# Patient Record
Sex: Male | Born: 1940 | Race: White | Hispanic: No | Marital: Single | Smoking: Former smoker
Health system: Southern US, Community
[De-identification: ages and names within clinical notes are randomized; demographics above are authoritative.]

## PROBLEM LIST (undated history)

## (undated) DIAGNOSIS — G40909 Epilepsy, unspecified, not intractable, without status epilepticus: Secondary | ICD-10-CM

## (undated) DIAGNOSIS — I10 Essential (primary) hypertension: Secondary | ICD-10-CM

## (undated) DIAGNOSIS — F329 Major depressive disorder, single episode, unspecified: Secondary | ICD-10-CM

## (undated) DIAGNOSIS — J449 Chronic obstructive pulmonary disease, unspecified: Secondary | ICD-10-CM

## (undated) DIAGNOSIS — I2510 Atherosclerotic heart disease of native coronary artery without angina pectoris: Secondary | ICD-10-CM

## (undated) DIAGNOSIS — F32A Depression, unspecified: Secondary | ICD-10-CM

## (undated) HISTORY — DX: Epilepsy, unspecified, not intractable, without status epilepticus: G40.909

## (undated) HISTORY — DX: Depression, unspecified: F32.A

## (undated) HISTORY — DX: Atherosclerotic heart disease of native coronary artery without angina pectoris: I25.10

## (undated) HISTORY — PX: BELOW KNEE LEG AMPUTATION: SUR23

## (undated) HISTORY — DX: Chronic obstructive pulmonary disease, unspecified: J44.9

## (undated) HISTORY — DX: Essential (primary) hypertension: I10

---
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
## 2019-02-12 ENCOUNTER — Encounter: Payer: Self-pay | Admitting: Adult Health

## 2019-02-12 ENCOUNTER — Non-Acute Institutional Stay (SKILLED_NURSING_FACILITY): Payer: Medicare Other | Admitting: Adult Health

## 2019-02-12 DIAGNOSIS — E876 Hypokalemia: Secondary | ICD-10-CM

## 2019-02-12 DIAGNOSIS — G40909 Epilepsy, unspecified, not intractable, without status epilepticus: Secondary | ICD-10-CM | POA: Insufficient documentation

## 2019-02-12 DIAGNOSIS — M62838 Other muscle spasm: Secondary | ICD-10-CM

## 2019-02-12 DIAGNOSIS — J302 Other seasonal allergic rhinitis: Secondary | ICD-10-CM

## 2019-02-12 DIAGNOSIS — I2510 Atherosclerotic heart disease of native coronary artery without angina pectoris: Secondary | ICD-10-CM

## 2019-02-12 DIAGNOSIS — N390 Urinary tract infection, site not specified: Secondary | ICD-10-CM | POA: Insufficient documentation

## 2019-02-12 DIAGNOSIS — J189 Pneumonia, unspecified organism: Secondary | ICD-10-CM

## 2019-02-12 DIAGNOSIS — I10 Essential (primary) hypertension: Secondary | ICD-10-CM

## 2019-02-12 DIAGNOSIS — E559 Vitamin D deficiency, unspecified: Secondary | ICD-10-CM

## 2019-02-12 DIAGNOSIS — G4700 Insomnia, unspecified: Secondary | ICD-10-CM | POA: Insufficient documentation

## 2019-02-12 DIAGNOSIS — F339 Major depressive disorder, recurrent, unspecified: Secondary | ICD-10-CM

## 2019-02-12 DIAGNOSIS — K5900 Constipation, unspecified: Secondary | ICD-10-CM | POA: Insufficient documentation

## 2019-02-12 DIAGNOSIS — K5901 Slow transit constipation: Secondary | ICD-10-CM

## 2019-02-12 DIAGNOSIS — B9629 Other Escherichia coli [E. coli] as the cause of diseases classified elsewhere: Secondary | ICD-10-CM

## 2019-02-12 DIAGNOSIS — G4709 Other insomnia: Secondary | ICD-10-CM

## 2019-02-12 DIAGNOSIS — J449 Chronic obstructive pulmonary disease, unspecified: Secondary | ICD-10-CM

## 2019-02-12 DIAGNOSIS — Z1612 Extended spectrum beta lactamase (ESBL) resistance: Secondary | ICD-10-CM

## 2019-02-12 DIAGNOSIS — J441 Chronic obstructive pulmonary disease with (acute) exacerbation: Secondary | ICD-10-CM | POA: Insufficient documentation

## 2019-02-12 MED ORDER — BACLOFEN 10 MG PO TABS: 10.0000 mg | ORAL_TABLET | Freq: Two times a day (BID) | ORAL | 0 refills | Status: DC

## 2019-02-12 MED ORDER — BUMETANIDE 1 MG PO TABS: 1.0000 mg | ORAL_TABLET | Freq: Every day | ORAL | 0 refills | Status: DC

## 2019-02-12 MED ORDER — POLYETHYLENE GLYCOL 3350 17 GM/SCOOP PO POWD: 17.0000 g | Freq: Every day | ORAL | 0 refills | Status: DC

## 2019-02-12 MED ORDER — VITAMIN C 500 MG PO TABS: 500.0000 mg | ORAL_TABLET | Freq: Every day | ORAL | 0 refills | Status: DC

## 2019-02-12 MED ORDER — CLOPIDOGREL BISULFATE 75 MG PO TABS: 75.0000 mg | ORAL_TABLET | Freq: Every day | ORAL | 0 refills | Status: DC

## 2019-02-12 MED ORDER — ZOLPIDEM TARTRATE 5 MG PO TABS: 5.0000 mg | ORAL_TABLET | Freq: Every evening | ORAL | 0 refills | Status: DC | PRN

## 2019-02-12 MED ORDER — POTASSIUM CHLORIDE CRYS ER 20 MEQ PO TBCR: 20.0000 meq | EXTENDED_RELEASE_TABLET | Freq: Every day | ORAL | 0 refills | Status: DC

## 2019-02-12 MED ORDER — SPIRIVA HANDIHALER 18 MCG IN CAPS: 18.0000 ug | ORAL_CAPSULE | Freq: Every day | RESPIRATORY_TRACT | 0 refills | Status: DC

## 2019-02-12 MED ORDER — VITAMIN D3 25 MCG (1000 UNIT) PO TABS: 1000.0000 [IU] | ORAL_TABLET | Freq: Every day | ORAL | 0 refills | Status: DC

## 2019-02-12 MED ORDER — DIVALPROEX SODIUM ER 500 MG PO TB24: 500.0000 mg | ORAL_TABLET | Freq: Every day | ORAL | 0 refills | Status: DC

## 2019-02-12 MED ORDER — SERTRALINE HCL 100 MG PO TABS: 100.0000 mg | ORAL_TABLET | Freq: Every day | ORAL | 0 refills | Status: DC

## 2019-02-12 MED ORDER — DOCUSATE SODIUM 100 MG PO CAPS: 100.0000 mg | ORAL_CAPSULE | Freq: Every day | ORAL | 0 refills | Status: DC

## 2019-02-12 MED ORDER — LORATADINE 10 MG PO TABS: 10.0000 mg | ORAL_TABLET | Freq: Every day | ORAL | 0 refills | Status: DC

## 2019-02-12 NOTE — Progress Notes (Signed)
Location:  [facility] Room Number: 306 B Place of Service:  SNF (31) Provider:  Kenard Gower, DNP, FNP-BC  No care team member to display  No emergency contact information on file.  Code Status:  Full Code  Goals of care: Advanced Directive information No flowsheet data found.   Chief Complaint  Patient presents with  . Acute Visit    Follow-hospitalization    HPI:  Pt is a 78 y.o. male who was admitted to [facility] on 02/12/19 post hospitalization 02/04/19 to 02/12/19 from [facility]. He was brought into the hospital with change in mental status, confusion and weakness.  He was noted to have UTI with possible gram-negative rods.  Gentle IV fluids and IV antibiotics given.  Urine culture showed ESBL E. coli.  Antibiotics were changed to IV Invanz 1 g daily and has completed dose.  He was supposed to go home with home health.  However, family requested skilled nursing facility.  He is feeling much better, but extremely weak.  He has PMH of hypertension, seizure disorder, history of left BKA, resting tremor, COPD, senile purpura, hypercholesterolemia and vitamin D deficiency.  Not on File  Outpatient Encounter Medications as of 02/12/2019  Medication Sig  . baclofen (LIORESAL) 10 MG tablet Take 1 tablet (10 mg total) by mouth 2 (two) times daily.  . bumetanide (BUMEX) 1 MG tablet Take 1 tablet (1 mg total) by mouth daily.  . cholecalciferol (VITAMIN D) 25 MCG (1000 UT) tablet Take 1 tablet (1,000 Units total) by mouth daily.  . clopidogrel (PLAVIX) 75 MG tablet Take 1 tablet (75 mg total) by mouth daily.  . divalproex (DEPAKOTE ER) 500 MG 24 hr tablet Take 1 tablet (500 mg total) by mouth at bedtime.  . docusate sodium (COLACE) 100 MG capsule Take 1 capsule (100 mg total) by mouth at bedtime.  Marland Kitchen loratadine (CLARITIN) 10 MG tablet Take 1 tablet (10 mg total) by mouth daily.  . polyethylene glycol powder  (GLYCOLAX/MIRALAX) 17 GM/SCOOP powder Take 17 g by mouth daily.  . potassium chloride SA (KLOR-CON) 20 MEQ tablet Take 1 tablet (20 mEq total) by mouth daily.  . sertraline (ZOLOFT) 100 MG tablet Take 1 tablet (100 mg total) by mouth daily.  Marland Kitchen tiotropium (SPIRIVA HANDIHALER) 18 MCG inhalation capsule Place 1 capsule (18 mcg total) into inhaler and inhale daily.  . vitamin C (ASCORBIC ACID) 500 MG tablet Take 1 tablet (500 mg total) by mouth daily.  Marland Kitchen zolpidem (AMBIEN) 5 MG tablet Take 1 tablet (5 mg total) by mouth at bedtime as needed for sleep.   No facility-administered encounter medications on file as of 02/12/2019.     Review of Systems  GENERAL: No change in appetite, no fatigue, no weight changes, no fever, chills or weakness MOUTH and THROAT: Denies oral discomfort, gingival pain or bleeding, pain from teeth or hoarseness   RESPIRATORY: no cough, SOB, DOE, wheezing, hemoptysis CARDIAC: No chest pain, edema or palpitations GI: No abdominal pain, diarrhea, constipation  NEUROLOGICAL: Denies dizziness, syncope, numbness, or headache PSYCHIATRIC: Denies feelings of depression or anxiety. No report of hallucinations, insomnia, paranoia, or agitation   There is no immunization history on file for this patient. There are no preventive care reminders to display for this patient. No flowsheet data found.   Vitals:   02/12/19 2131  BP: 108/66  Pulse: 84  Resp: 20  Temp: 98.8 F (37.1 C)  Weight: 141 lb (64 kg)  Height: 5\' 11"  (1.803 m)   Body mass index is 19.67 kg/m.  Physical Exam  GENERAL APPEARANCE:  In no acute distress.  SKIN:  Skin is warm and dry.  MOUTH and THROAT: Lips are without lesions. Oral mucosa is moist and without lesions. Tongue is normal in shape, size, and color and without lesions RESPIRATORY: Breathing is even & unlabored, BS CTAB CARDIAC: RRR, no murmur,no extra heart sounds, no edema GI: Abdomen soft, normal BS, no masses, no tenderness  EXTREMITIES:  Left BKA NEUROLOGICAL: + tremor. Speech is clear. Alert to self and place, disoriented to time. PSYCHIATRIC:  Affect and behavior are appropriate  Labs reviewed: Sodium 143, potassium 3.9, BUN 24, creatinine 0.91, AST 59, ALT 17, WBC 15.6, hemoglobin 13.6    Assessment/Plan  1. UTI due to extended-spectrum beta lactamase (ESBL) producing Escherichia coli - completed IV Unasyn treatment  2. Pneumonia of right lung due to infectious organism, unspecified part of lung - completed IV Unasyn treatment  3. Coronary artery disease involving native coronary artery of native heart without angina pectoris - stable - clopidogrel (PLAVIX) 75 MG tablet; Take 1 tablet (75 mg total) by mouth daily.  Dispense: 30 tablet; Refill: 0  4. Essential hypertension - stable - bumetanide (BUMEX) 1 MG tablet; Take 1 tablet (1 mg total) by mouth daily.  Dispense: 30 tablet; Refill: 0  5. Vitamin D deficiency - cholecalciferol (VITAMIN D) 25 MCG (1000 UT) tablet; Take 1 tablet (1,000 Units total) by mouth daily.  Dispense: 30 tablet; Refill: 0  6. Seasonal allergies - loratadine (CLARITIN) 10 MG tablet; Take 1 tablet (10 mg total) by mouth daily.  Dispense: 30 tablet; Refill: 0  7. Muscle spasm/History of left BKA - baclofen (LIORESAL) 10 MG tablet; Take 1 tablet (10 mg total) by mouth 2 (two) times daily.  Dispense: 60 each; Refill: 0  8. Other insomnia - zolpidem (AMBIEN) 5 MG tablet; Take 1 tablet (5 mg total) by mouth at bedtime as needed for sleep.  Dispense: 30 tablet; Refill: 0  9. Chronic obstructive pulmonary disease, unspecified COPD type ([place]) - has O2 @ 2L/min via  - tiotropium (SPIRIVA HANDIHALER) 18 MCG inhalation capsule; Place 1 capsule (18 mcg total) into inhaler and inhale daily.  Dispense: 30 capsule; Refill: 0  10. Depression, recurrent ([place]) - sertraline (ZOLOFT) 100 MG tablet; Take 1 tablet (100 mg total) by mouth daily.  Dispense: 30 tablet; Refill: 0  11.  Hypokalemia - potassium chloride SA (KLOR-CON) 20 MEQ tablet; Take 1 tablet (20 mEq total) by mouth daily.  Dispense: 30 tablet; Refill: 0  12. Slow transit constipation - docusate sodium (COLACE) 100 MG capsule; Take 1 capsule (100 mg total) by mouth at bedtime.  Dispense: 30 capsule; Refill: 0 - polyethylene glycol powder (GLYCOLAX/MIRALAX) 17 GM/SCOOP powder; Take 17 g by mouth daily.  Dispense: 507 g; Refill: 0  13. Seizure disorder (HCC) - divalproex (DEPAKOTE ER) 500 MG 24 hr tablet; Take 1 tablet (500 mg total) by mouth at bedtime.  Dispense: 30 tablet; Refill: 0    Family/ staff Communication: Discussed plan of care with resident.  Labs/tests ordered:   CBC and BMP in 1 week  Goals of care:   Short-term care   Durenda Age, DNP, FNP-BC [facility] and Adult Medicine [phone] (Monday-Friday 8:00 a.m. - 5:00 p.m.) [phone] (after hours)

## 2019-02-13 ENCOUNTER — Encounter: Payer: Self-pay | Admitting: Internal Medicine

## 2019-02-13 ENCOUNTER — Non-Acute Institutional Stay (SKILLED_NURSING_FACILITY): Payer: Medicare Other | Admitting: Internal Medicine

## 2019-02-13 DIAGNOSIS — N390 Urinary tract infection, site not specified: Secondary | ICD-10-CM

## 2019-02-13 DIAGNOSIS — B9629 Other Escherichia coli [E. coli] as the cause of diseases classified elsewhere: Secondary | ICD-10-CM

## 2019-02-13 DIAGNOSIS — R410 Disorientation, unspecified: Secondary | ICD-10-CM | POA: Diagnosis not present

## 2019-02-13 DIAGNOSIS — J189 Pneumonia, unspecified organism: Secondary | ICD-10-CM | POA: Diagnosis not present

## 2019-02-13 DIAGNOSIS — Z1612 Extended spectrum beta lactamase (ESBL) resistance: Secondary | ICD-10-CM

## 2019-02-13 DIAGNOSIS — G40909 Epilepsy, unspecified, not intractable, without status epilepticus: Secondary | ICD-10-CM

## 2019-02-13 DIAGNOSIS — G4709 Other insomnia: Secondary | ICD-10-CM

## 2019-02-13 DIAGNOSIS — R4182 Altered mental status, unspecified: Secondary | ICD-10-CM | POA: Insufficient documentation

## 2019-02-13 DIAGNOSIS — I10 Essential (primary) hypertension: Secondary | ICD-10-CM

## 2019-02-13 NOTE — Patient Instructions (Signed)
See assessment and plan under each diagnosis in the problem list and acutely for this visit 
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
## 2019-02-13 NOTE — Assessment & Plan Note (Signed)
Continue Depakote. 

## 2019-02-13 NOTE — Assessment & Plan Note (Signed)
He has no GU symptoms at this time.

## 2019-02-13 NOTE — Assessment & Plan Note (Signed)
Clinically emphysema suggested with hyperinflated chest and decreased breath sounds.  He denies any active cardiopulmonary symptoms at this time.

## 2019-02-13 NOTE — Assessment & Plan Note (Addendum)
Zolpidem is on the [place]' list.  He denies insomnia. Melatonin will be substituted for this high risk medication

## 2019-02-13 NOTE — Assessment & Plan Note (Addendum)
BP controlled w/o antihypertensive medications  

## 2019-02-14 ENCOUNTER — Encounter: Payer: Self-pay | Admitting: Internal Medicine

## 2019-02-20 LAB — BASIC METABOLIC PANEL
BUN: 23 — AB (ref 4–21)
BUN: 33 — AB (ref 4–21)
CO2: 27 — AB (ref 13–22)
CO2: 27 — AB (ref 13–22)
Chloride: 99 (ref 99–108)
Chloride: 99 (ref 99–108)
Creatinine: 0.7 (ref 0.6–1.3)
Creatinine: 0.7 (ref 0.6–1.3)
Glucose: 103
Glucose: 103
Potassium: 4.9 (ref 3.4–5.3)
Sodium: 139 (ref 137–147)
Sodium: 139 (ref 137–147)

## 2019-02-20 LAB — CBC AND DIFFERENTIAL
HCT: 37 — AB (ref 41–53)
HCT: 37 — AB (ref 41–53)
Hemoglobin: 12.3 — AB (ref 13.5–17.5)
Hemoglobin: 12.3 — AB (ref 13.5–17.5)
Platelets: 560 — AB (ref 150–399)
Platelets: 560 — AB (ref 150–399)
WBC: 9.8
WBC: 9.8

## 2019-02-20 LAB — COMPREHENSIVE METABOLIC PANEL
Calcium: 9.1 (ref 8.7–10.7)
Calcium: 9.1 (ref 8.7–10.7)
GFR calc Af Amer: >90
GFR calc non Af Amer: >90

## 2019-02-20 LAB — CBC
RBC: 3.93 (ref 3.87–5.11)
RBC: 3.93 (ref 3.87–5.11)

## 2019-03-02 ENCOUNTER — Non-Acute Institutional Stay (SKILLED_NURSING_FACILITY): Payer: Medicare Other | Admitting: Adult Health

## 2019-03-02 ENCOUNTER — Encounter: Payer: Self-pay | Admitting: Adult Health

## 2019-03-02 DIAGNOSIS — I10 Essential (primary) hypertension: Secondary | ICD-10-CM | POA: Diagnosis not present

## 2019-03-02 DIAGNOSIS — G40909 Epilepsy, unspecified, not intractable, without status epilepticus: Secondary | ICD-10-CM | POA: Diagnosis not present

## 2019-03-02 DIAGNOSIS — J449 Chronic obstructive pulmonary disease, unspecified: Secondary | ICD-10-CM

## 2019-03-02 DIAGNOSIS — E559 Vitamin D deficiency, unspecified: Secondary | ICD-10-CM | POA: Diagnosis not present

## 2019-03-02 DIAGNOSIS — F339 Major depressive disorder, recurrent, unspecified: Secondary | ICD-10-CM

## 2019-03-02 NOTE — Progress Notes (Signed)
Location:  [facility] Room Number: 125/B Place of Service:  SNF (31) Provider:  Durenda Age, DNP, FNP-BC  Patient Care Team: Hendricks Limes, MD as PCP - General (Internal Medicine)  Extended Emergency Contact Information Primary Emergency Contact: Wilmer, Santillo Mobile Phone: [phone] Relation: Daughter  Code Status:  Full Code  Goals of care: Advanced Directive information Advanced Directives 03/06/2019  Does Patient Have a Medical Advance Directive? Yes  Type of Advance Directive (No Data)  Does patient want to make changes to medical advance directive? No - Patient declined     Chief Complaint  Patient presents with  . Acute Visit    Follow up hospitalization     HPI:  Pt is a 78 y.o. male seen today for follow-up hospitalization.  He has PMH of hypertension, seizure disorder, history of left BKA, resting tremor, COPD, senile purpura, hypercholesterolemia and vitamin D deficiency.  BPs 132/78, 114/64, 130s/77, 170s/73, 126/72. He takes Bumetanide daily. No reported SOB. He has O2 @ 2L/min continuously. He takes Spiriva for COPD.   He has been admitted to [facility] on 02/12/19 post hospitalization 02/04/19 to 02/12/19 from [place] care.  He was brought into the hospital with change in mental status, confusion and weakness.  He was noted to have UTI with possible gram-negative rods.  Gentle IV fluids and IV antibiotics given.  Urine culture showed ESBL E. coli.  Antibiotics were changed to IV Invanz 1 g daily and has completed dose.  He was supposed to go home with home health.  However, family requested skilled nursing facility.  He is currently having a short-term rehabilitation.  Past Medical History:  Diagnosis Date  . CAD (coronary artery disease)   . COPD (chronic obstructive pulmonary disease) ([place])   . Depression   . Essential hypertension   . Seizure disorder [facility])    Past Surgical  History:  Procedure Laterality Date  . BELOW KNEE LEG AMPUTATION Left     No Known Allergies  Outpatient Encounter Medications as of 03/02/2019  Medication Sig  . bisacodyl (DULCOLAX) 10 MG suppository If not relieved by MOM, give 10 mg Bisacodyl suppositiory rectally X 1 dose in 24 hours as needed (Do not use constipation standing orders for residents with renal failure/CFR less than 30. Contact MD for orders) (Physician Order)  . bumetanide (BUMEX) 1 MG tablet Take 1 tablet (1 mg total) by mouth daily.  . cholecalciferol (VITAMIN D) 25 MCG (1000 UT) tablet Take 1 tablet (1,000 Units total) by mouth daily.  . clopidogrel (PLAVIX) 75 MG tablet Take 1 tablet (75 mg total) by mouth daily.  . divalproex (DEPAKOTE ER) 500 MG 24 hr tablet Take 1 tablet (500 mg total) by mouth at bedtime.  . docusate sodium (COLACE) 100 MG capsule Take 1 capsule (100 mg total) by mouth at bedtime.  Marland Kitchen loratadine (CLARITIN) 10 MG tablet Take 1 tablet (10 mg total) by mouth daily.  . magnesium hydroxide (MILK OF MAGNESIA) 400 MG/5ML suspension If no BM in 3 days, give 30 cc Milk of Magnesium p.o. x 1 dose in 24 hours as needed (Do not use standing constipation orders for residents with renal failure CFR less than 30. Contact MD for orders) (Physician Order)  . Melatonin 3 MG TABS Take 3 mg by mouth at bedtime.  . polyethylene glycol powder (GLYCOLAX/MIRALAX) 17 GM/SCOOP powder Take 17 g by mouth daily.  . potassium chloride SA (KLOR-CON) 20 MEQ tablet Take 1 tablet (  20 mEq total) by mouth daily.  . sertraline (ZOLOFT) 100 MG tablet Take 1 tablet (100 mg total) by mouth daily.  . Sodium Phosphates (RA SALINE ENEMA RE) If not relieved by Biscodyl suppository, give disposable Saline Enema rectally X 1 dose/24 hrs as needed (Do not use constipation standing orders for residents with renal failure/CFR less than 30. Contact MD for orders)(Physician Or  . tiotropium (SPIRIVA HANDIHALER) 18 MCG inhalation capsule Place 1  capsule (18 mcg total) into inhaler and inhale daily.  . vitamin C (ASCORBIC ACID) 500 MG tablet Take 1 tablet (500 mg total) by mouth daily.  . [DISCONTINUED] baclofen (LIORESAL) 10 MG tablet Take 1 tablet (10 mg total) by mouth 2 (two) times daily.  . [DISCONTINUED] zolpidem (AMBIEN) 5 MG tablet Take 1 tablet (5 mg total) by mouth at bedtime as needed for sleep.   No facility-administered encounter medications on file as of 03/02/2019.    Review of Systems  GENERAL: No change in appetite, no fatigue, no weight changes, no fever, chills or weakness MOUTH and THROAT: Denies oral discomfort, gingival pain or bleeding RESPIRATORY: no cough, SOB, DOE, wheezing, hemoptysis CARDIAC: No chest pain, edema or palpitations GI: No abdominal pain, diarrhea, constipation, heart burn, nausea or vomiting NEUROLOGICAL: Denies dizziness, syncope, numbness, or headache PSYCHIATRIC: Denies feelings of depression or anxiety. No report of hallucinations, insomnia, paranoia, or agitation   Immunization History  Administered Date(s) Administered  . Influenza-Unspecified 12/14/2018   Pertinent  Health Maintenance Due  Topic Date Due  . PNA vac Low Risk Adult (1 of 2 - PCV13) 03/01/2020 (Originally 12/03/2005)  . INFLUENZA VACCINE  Completed   No flowsheet data found.   Vitals:   03/02/19 1049  BP: 122/78  Pulse: 80  Resp: 18  Temp: 98.1 F (36.7 C)  TempSrc: Oral  SpO2: 97%  Weight: 139 lb 3.2 oz (63.1 kg)  Height: '5\' 11"'  (1.803 m)   Body mass index is 19.41 kg/m.  Physical Exam  GENERAL APPEARANCE:  In no acute distress.  SKIN:  Skin is warm and dry.  MOUTH and THROAT: Lips are without lesions. Oral mucosa is moist and without lesions. Tongue is normal in shape, size, and color and without lesions RESPIRATORY: Breathing is even & unlabored, BS CTAB CARDIAC: RRR, no murmur,no extra heart sounds, no edema GI: Abdomen soft, normal BS, no masses, no tenderness EXTREMITIES: Left  BKA NEUROLOGICAL: There is no tremor. Speech is clear. Alert and oriented X 3. PSYCHIATRIC:  Affect and behavior are appropriate  Labs reviewed:  02/20/19   WBC 9.8 hemoglobin 12.3 hematocrit 36.9 MCV 93.7 platelet 560 glucose 103 calcium 9.1 creatinine 0.69 BUN 32.5 sodium 139  k 4.9 eGFR >90   Assessment/Plan  1. Chronic obstructive pulmonary disease, unspecified COPD type ([place]) - no SOB, nor wheezing, continue Spiriva  2. Seizure disorder ([place]) - no recent seizure, continue Divalproex  3. Vitamin D deficiency - continue Vitamin D supplementation  4. Essential hypertension - well-controlled, no edema, continue Bumetanide  5. Depression, recurrent ([place]) - mood is stable, continue Sertraline    Family/ staff Communication: Discussed plan of care with resident.  Labs/tests ordered: None  Goals of care:   Short-term care   Durenda Age, DNP, FNP-BC [facility] and Adult Medicine [phone] (Monday-Friday 8:00 a.m. - 5:00 p.m.) [phone] (after hours)

## 2019-03-06 ENCOUNTER — Non-Acute Institutional Stay (SKILLED_NURSING_FACILITY): Payer: Medicare Other | Admitting: Adult Health

## 2019-03-06 ENCOUNTER — Encounter: Payer: Self-pay | Admitting: Adult Health

## 2019-03-06 DIAGNOSIS — J449 Chronic obstructive pulmonary disease, unspecified: Secondary | ICD-10-CM | POA: Diagnosis not present

## 2019-03-06 DIAGNOSIS — G40909 Epilepsy, unspecified, not intractable, without status epilepticus: Secondary | ICD-10-CM | POA: Diagnosis not present

## 2019-03-06 DIAGNOSIS — I2510 Atherosclerotic heart disease of native coronary artery without angina pectoris: Secondary | ICD-10-CM

## 2019-03-06 DIAGNOSIS — J302 Other seasonal allergic rhinitis: Secondary | ICD-10-CM

## 2019-03-06 DIAGNOSIS — F339 Major depressive disorder, recurrent, unspecified: Secondary | ICD-10-CM

## 2019-03-06 DIAGNOSIS — G4709 Other insomnia: Secondary | ICD-10-CM

## 2019-03-06 NOTE — Progress Notes (Signed)
Location:  [place] Room Number: 125/B Place of Service:  SNF (31) Provider:  Durenda Age, DNP, FNP-BC  Patient Care Team: Hendricks Limes, MD as PCP - General (Internal Medicine)  Extended Emergency Contact Information Primary Emergency Contact: [place], [place] Mobile Phone: [phone] Relation: Daughter  Code Status:  Full Code  Goals of care: Advanced Directive information Advanced Directives 03/06/2019  Does Patient Have a Medical Advance Directive? Yes  Type of Advance Directive (No Data)  Does patient want to make changes to medical advance directive? No - Patient declined     Chief Complaint  Patient presents with  . Medical Management of Chronic Issues    Routine visit of medical management    HPI:  Pt is a 78 y.o. male seen today for medical management of chronic diseases. He has of hypertension, seizure disorder, history of left BKA, resting tremor, COPD, senile purpura, hypercholesterolemia and vitamin D deficiency. He had weight loss 4.74% in 9 days, 9.18% loss in 16 days. He has meal intake 25-75%. Magic cup was added for supplementation. He denies chest pain. He takes Plavix for CAD.   Past Medical History:  Diagnosis Date  . CAD (coronary artery disease)   . COPD (chronic obstructive pulmonary disease) ([place])   . Depression   . Essential hypertension   . Seizure disorder [facility])    Past Surgical History:  Procedure Laterality Date  . BELOW KNEE LEG AMPUTATION Left     No Known Allergies  Outpatient Encounter Medications as of 03/06/2019  Medication Sig  . bisacodyl (DULCOLAX) 10 MG suppository If not relieved by MOM, give 10 mg Bisacodyl suppositiory rectally X 1 dose in 24 hours as needed (Do not use constipation standing orders for residents with renal failure/CFR less than 30. Contact MD for orders) (Physician Order)  . bumetanide (BUMEX) 1 MG tablet Take 1 tablet (1 mg total) by mouth daily.  . cholecalciferol  (VITAMIN D) 25 MCG (1000 UT) tablet Take 1 tablet (1,000 Units total) by mouth daily.  . clopidogrel (PLAVIX) 75 MG tablet Take 1 tablet (75 mg total) by mouth daily.  . divalproex (DEPAKOTE ER) 500 MG 24 hr tablet Take 1 tablet (500 mg total) by mouth at bedtime.  . docusate sodium (COLACE) 100 MG capsule Take 1 capsule (100 mg total) by mouth at bedtime.  . feeding supplement, ENSURE ENLIVE, (ENSURE ENLIVE) LIQD Take 237 mLs by mouth 2 (two) times daily between meals. To promote weight gain  . loratadine (CLARITIN) 10 MG tablet Take 1 tablet (10 mg total) by mouth daily.  . magnesium hydroxide (MILK OF MAGNESIA) 400 MG/5ML suspension If no BM in 3 days, give 30 cc Milk of Magnesium p.o. x 1 dose in 24 hours as needed (Do not use standing constipation orders for residents with renal failure CFR less than 30. Contact MD for orders) (Physician Order)  . Melatonin 3 MG TABS Take 3 mg by mouth at bedtime.  . NON FORMULARY Give Magic Cup once a day to promote weight gain  . polyethylene glycol powder (GLYCOLAX/MIRALAX) 17 GM/SCOOP powder Take 17 g by mouth daily.  . potassium chloride SA (KLOR-CON) 20 MEQ tablet Take 1 tablet (20 mEq total) by mouth daily.  . sertraline (ZOLOFT) 100 MG tablet Take 1 tablet (100 mg total) by mouth daily.  . Sodium Phosphates (RA SALINE ENEMA RE) If not relieved by Biscodyl suppository, give disposable Saline Enema rectally X 1 dose/24 hrs as needed (Do not use constipation  standing orders for residents with renal failure/CFR less than 30. Contact MD for orders)(Physician Or  . tiotropium (SPIRIVA HANDIHALER) 18 MCG inhalation capsule Place 1 capsule (18 mcg total) into inhaler and inhale daily.  . vitamin C (ASCORBIC ACID) 500 MG tablet Take 1 tablet (500 mg total) by mouth daily.   No facility-administered encounter medications on file as of 03/06/2019.    Review of Systems  GENERAL: No fatigue, no fever, chills or weakness MOUTH and THROAT: Denies oral discomfort,  gingival pain or bleeding RESPIRATORY: no cough, SOB, DOE, wheezing, hemoptysis CARDIAC: No chest pain, edema or palpitations GI: No abdominal pain, diarrhea, constipation, heart burn, nausea or vomiting GU: Denies dysuria, frequency, hematuria, incontinence, or discharge NEUROLOGICAL: Denies dizziness, syncope, numbness, or headache PSYCHIATRIC: Denies feelings of depression or anxiety. No report of hallucinations, insomnia, paranoia, or agitation   Immunization History  Administered Date(s) Administered  . Influenza-Unspecified 12/14/2018   Pertinent  Health Maintenance Due  Topic Date Due  . PNA vac Low Risk Adult (1 of 2 - PCV13) 03/01/2020 (Originally 12/03/2005)  . INFLUENZA VACCINE  Completed   No flowsheet data found.   Vitals:   03/06/19 0957  BP: (!) 98/59  Pulse: 96  Resp: 17  Temp: 98.8 F (37.1 C)  TempSrc: Oral  SpO2: 97%  Weight: 132 lb 9.6 oz (60.1 kg)  Height: '5\' 11"'  (1.803 m)   Body mass index is 18.49 kg/m.  Physical Exam  GENERAL APPEARANCE: In no acute distress.  SKIN:  Skin is warm and dry.  MOUTH and THROAT: Lips are without lesions. Oral mucosa is moist and without lesions. Tongue is normal in shape, size, and color and without lesions RESPIRATORY: Breathing is even & unlabored, BS CTAB CARDIAC: RRR, no murmur,no extra heart sounds, no edema GI: Abdomen soft, normal BS, no masses, no tenderness EXTREMITIES:  Able to move X 4 extremities. Left BKA NEUROLOGICAL:  Speech is clear. Alert and oriented X 3. PSYCHIATRIC:  Affect and behavior are appropriate  Labs reviewed: 02/20/19 WBC 9.8 hemoglobin 12.3 hematocrit 36.9 MCV 93.7 platelet 560 glucose 103 calcium 9.1 creatinine 0.69 BUN 32.5 sodium 139 K 4.9  eGFR>90   Assessment/Plan  1. Coronary artery disease involving native coronary artery of native heart without angina pectoris -Denies chest pain, continue Plavix  2. Chronic obstructive pulmonary disease, unspecified COPD type ([place]) -No  SOB/wheezing, continue oxygen 2 L/minute via [place] continuously, Spiriva Well 3. Seizure disorder ([place]) -No recent seizures, continue divalproex sodium  4. Seasonal allergies -Stable, continue loratadine.  5. Other insomnia -Verbalized sleeping at night, continue melatonin  6. Depression, recurrent ([place]) -Denies depression, continue sertraline   Family/ staff Communication: Discussed plan of care with resident.  Labs/tests ordered: None  Goals of care: Short-term care  Durenda Age, DNP, FNP-BC [facility] [phone] (Monday-Friday 8:00 a.m. - 5:00 p.m.) [phone] (after hours)

## 2019-03-11 ENCOUNTER — Encounter: Payer: Self-pay | Admitting: Adult Health

## 2019-03-19 ENCOUNTER — Encounter: Payer: Self-pay | Admitting: Adult Health

## 2019-03-19 ENCOUNTER — Non-Acute Institutional Stay (SKILLED_NURSING_FACILITY): Payer: Medicare Other | Admitting: Adult Health

## 2019-03-19 DIAGNOSIS — J449 Chronic obstructive pulmonary disease, unspecified: Secondary | ICD-10-CM

## 2019-03-19 DIAGNOSIS — I2510 Atherosclerotic heart disease of native coronary artery without angina pectoris: Secondary | ICD-10-CM

## 2019-03-19 DIAGNOSIS — E876 Hypokalemia: Secondary | ICD-10-CM | POA: Diagnosis not present

## 2019-03-19 DIAGNOSIS — G4709 Other insomnia: Secondary | ICD-10-CM | POA: Diagnosis not present

## 2019-03-19 NOTE — Progress Notes (Signed)
Location:  [place] Room Number: 125/B Place of Service:  SNF (31) Provider:  Durenda Age, DNP, FNP-BC  Patient Care Team: Hendricks Limes, MD as PCP - General (Internal Medicine)  Extended Emergency Contact Information Primary Emergency Contact: K[place]ious, [place] Mobile Phone: [phone] Relation: Daughter  Code Status:  Full Code  Goals of care: Advanced Directive information Advanced Directives 03/06/2019  Does Patient Have a Medical Advance Directive? Yes  Type of Advance Directive (No Data)  Does patient want to make changes to medical advance directive? No - Patient declined     Chief Complaint  Patient presents with  . Medical Management of Chronic Issues    Routine Vist of medical management    HPI:  Pt is a 79 y.o. male seen today for medical management of chronic diseases.  He has PMH of hypertension, seizure disorder, history of left BKA, resting tremor, COPD, senile purpura, hypercholesterolemia and vitamin D deficiency. He continues to have PT/OT for therapeutic strengthening exercise. Goal is for him to return to [facility]/   Past Medical History:  Diagnosis Date  . CAD (coronary artery disease)   . COPD (chronic obstructive pulmonary disease) ([place])   . Depression   . Essential hypertension   . Seizure disorder [facility])    Past Surgical History:  Procedure Laterality Date  . BELOW KNEE LEG AMPUTATION Left     No Known Allergies  Outpatient Encounter Medications as of 03/19/2019  Medication Sig  . bisacodyl (DULCOLAX) 10 MG suppository If not relieved by MOM, give 10 mg Bisacodyl suppositiory rectally X 1 dose in 24 hours as needed (Do not use constipation standing orders for residents with renal failure/CFR less than 30. Contact MD for orders) (Physician Order)  . bumetanide (BUMEX) 1 MG tablet Take 1 tablet (1 mg total) by mouth daily.  . cholecalciferol (VITAMIN D) 25 MCG (1000 UT) tablet Take 1 tablet (1,000 Units  total) by mouth daily.  . clopidogrel (PLAVIX) 75 MG tablet Take 1 tablet (75 mg total) by mouth daily.  . divalproex (DEPAKOTE ER) 500 MG 24 hr tablet Take 1 tablet (500 mg total) by mouth at bedtime.  . docusate sodium (COLACE) 100 MG capsule Take 1 capsule (100 mg total) by mouth at bedtime.  . feeding supplement, ENSURE ENLIVE, (ENSURE ENLIVE) LIQD Take 237 mLs by mouth 2 (two) times daily between meals. To promote weight gain  . loratadine (CLARITIN) 10 MG tablet Take 1 tablet (10 mg total) by mouth daily.  . magnesium hydroxide (MILK OF MAGNESIA) 400 MG/5ML suspension If no BM in 3 days, give 30 cc Milk of Magnesium p.o. x 1 dose in 24 hours as needed (Do not use standing constipation orders for residents with renal failure CFR less than 30. Contact MD for orders) (Physician Order)  . Melatonin 3 MG TABS Take 3 mg by mouth at bedtime.  . NON FORMULARY Give Magic Cup once a day to promote weight gain  . polyethylene glycol powder (GLYCOLAX/MIRALAX) 17 GM/SCOOP powder Take 17 g by mouth daily.  . potassium chloride SA (KLOR-CON) 20 MEQ tablet Take 1 tablet (20 mEq total) by mouth daily.  . sertraline (ZOLOFT) 100 MG tablet Take 1 tablet (100 mg total) by mouth daily.  . Sodium Phosphates (RA SALINE ENEMA RE) If not relieved by Biscodyl suppository, give disposable Saline Enema rectally X 1 dose/24 hrs as needed (Do not use constipation standing orders for residents with renal failure/CFR less than 30. Contact MD for  orders)(Physician Or  . tiotropium (SPIRIVA HANDIHALER) 18 MCG inhalation capsule Place 1 capsule (18 mcg total) into inhaler and inhale daily.  . vitamin C (ASCORBIC ACID) 500 MG tablet Take 1 tablet (500 mg total) by mouth daily.   No facility-administered encounter medications on file as of 03/19/2019.    Review of Systems  GENERAL: No change in appetite, no fatigue, no weight changes, no fever, chills or weakness MOUTH and THROAT: Denies oral discomfort, gingival pain    RESPIRATORY: no cough, SOB, DOE, wheezing, hemoptysis CARDIAC: No chest pain, edema or palpitations GI: No abdominal pain, diarrhea, constipation, heart burn, nausea or vomiting GU: Denies dysuria, frequency, hematuria or discharge NEUROLOGICAL: Denies dizziness, syncope, numbness, or headache PSYCHIATRIC: Denies feelings of depression or anxiety. No report of hallucinations, insomnia, paranoia, or agitation    Immunization History  Administered Date(s) Administered  . Influenza-Unspecified 12/14/2018   Pertinent  Health Maintenance Due  Topic Date Due  . PNA vac Low Risk Adult (1 of 2 - PCV13) 03/01/2020 (Originally 12/03/2005)  . INFLUENZA VACCINE  Completed   No flowsheet data found.   Vitals:   03/19/19 1523  BP: 110/66  Pulse: 92  Resp: 16  Temp: 98.3 F (36.8 C)  TempSrc: Oral  SpO2: 97%  Weight: 136 lb 9.6 oz (62 kg)  Height: '5\' 11"'$  (1.803 m)   Body mass index is 19.05 kg/m.  Physical Exam  GENERAL APPEARANCE: Well nourished. In no acute distress. Normal body habitus SKIN:  Skin is warm and dry.  MOUTH and THROAT: Lips are without lesions. Oral mucosa is moist and without lesions. Tongue is normal in shape, size, and color and without lesions RESPIRATORY: Breathing is even & unlabored, BS CTAB CARDIAC: RRR, no murmur,no extra heart sounds, no edema GI: Abdomen soft, normal BS, no masses, no tenderness EXTREMITIES: Able to move x4 extremities. Left BKA NEUROLOGICAL: + tremors. Speech is clear. Alert to self and time, disoriented to place. PSYCHIATRIC:  Affect and behavior are appropriate  Labs reviewed: 02/20/19   WBC 9.8 hemoglobin 12.3 hematocrit 36.9 MCV 93.7 platelet 560 glucose 103 calcium 9.1 creatinine 0.69 BUN 32.5 sodium 139 K 4.9 eGFR >90   Assessment/Plan  1. Hypokalemia -Continue KCl ER 20 meq daily  2. Chronic obstructive pulmonary disease, unspecified COPD type ([place]) -No wheezing nor SOB, continue Spiriva and O2 at 2 L/minute via [place]  continuously  3. Coronary artery disease involving native coronary artery of native heart without angina pectoris -Denies chest pain, continue Plavix 75 mg 1 tab daily  4. Other insomnia -Sleeps well at night, continue melatonin 3 mg 1 tab at bedtime   Family/ staff Communication: Discussed plan of care with resident.  Labs/tests ordered:  None  Goals of care:   Short-term care   Durenda Age, DNP, FNP-BC [facility] [phone] (Monday-Friday 8:00 a.m. - 5:00 p.m.) [phone] (after hours)

## 2019-03-29 ENCOUNTER — Encounter: Payer: Self-pay | Admitting: Adult Health

## 2019-03-29 DIAGNOSIS — I2510 Atherosclerotic heart disease of native coronary artery without angina pectoris: Secondary | ICD-10-CM

## 2019-03-29 DIAGNOSIS — J449 Chronic obstructive pulmonary disease, unspecified: Secondary | ICD-10-CM

## 2019-03-29 DIAGNOSIS — G40909 Epilepsy, unspecified, not intractable, without status epilepticus: Secondary | ICD-10-CM

## 2019-03-29 DIAGNOSIS — I10 Essential (primary) hypertension: Secondary | ICD-10-CM

## 2019-03-29 DIAGNOSIS — F339 Major depressive disorder, recurrent, unspecified: Secondary | ICD-10-CM

## 2019-03-29 NOTE — Progress Notes (Signed)
Location:  [facility] Room Number: 125/B Place of Service:  SNF (31) Provider:  Kenard Gower, DNP, FNP-BC  Patient Care Team: Pecola Lawless, MD as PCP - General (Internal Medicine)  Extended Emergency Contact Information Primary Emergency Contact: [place], [place] Mobile Phone: [phone] Relation: Daughter  Code Status:  Full Code  Goals of care: Advanced Directive information Advanced Directives 03/29/2019  Does Patient Have a Medical Advance Directive? Yes  Type of Advance Directive (No Data)  Does patient want to make changes to medical advance directive? No - Patient declined     Chief Complaint  Patient presents with  . Discharge Note    Discharge Visit    HPI:  Pt is a 79 y.o. male seen today for medical management of chronic diseases.     Past Medical History:  Diagnosis Date  . CAD (coronary artery disease)   . COPD (chronic obstructive pulmonary disease) ([facility])   . Depression   . Essential hypertension   . Seizure disorder [facility])    Past Surgical History:  Procedure Laterality Date  . BELOW KNEE LEG AMPUTATION Left     No Known Allergies  Outpatient Encounter Medications as of 03/29/2019  Medication Sig  . bisacodyl (DULCOLAX) 10 MG suppository If not relieved by MOM, give 10 mg Bisacodyl suppositiory rectally X 1 dose in 24 hours as needed (Do not use constipation standing orders for residents with renal failure/CFR less than 30. Contact MD for orders) (Physician Order)  . bumetanide (BUMEX) 1 MG tablet Take 1 tablet (1 mg total) by mouth daily.  . cholecalciferol (VITAMIN D) 25 MCG (1000 UT) tablet Take 1 tablet (1,000 Units total) by mouth daily.  . clopidogrel (PLAVIX) 75 MG tablet Take 1 tablet (75 mg total) by mouth daily.  . divalproex (DEPAKOTE ER) 500 MG 24 hr tablet Take 1 tablet (500 mg total) by mouth at bedtime.  . docusate sodium (COLACE) 100 MG capsule Take 1 capsule (100 mg total) by mouth at bedtime.  .  feeding supplement, ENSURE ENLIVE, (ENSURE ENLIVE) LIQD Take 237 mLs by mouth 2 (two) times daily between meals. To promote weight gain  . loratadine (CLARITIN) 10 MG tablet Take 1 tablet (10 mg total) by mouth daily.  . magnesium hydroxide (MILK OF MAGNESIA) 400 MG/5ML suspension If no BM in 3 days, give 30 cc Milk of Magnesium p.o. x 1 dose in 24 hours as needed (Do not use standing constipation orders for residents with renal failure CFR less than 30. Contact MD for orders) (Physician Order)  . Melatonin 3 MG TABS Take 3 mg by mouth at bedtime.  . NON FORMULARY Give Magic Cup once a day to promote weight gain  . polyethylene glycol powder (GLYCOLAX/MIRALAX) 17 GM/SCOOP powder Take 17 g by mouth daily.  . potassium chloride SA (KLOR-CON) 20 MEQ tablet Take 1 tablet (20 mEq total) by mouth daily.  . sertraline (ZOLOFT) 100 MG tablet Take 1 tablet (100 mg total) by mouth daily.  . Sodium Phosphates (RA SALINE ENEMA RE) If not relieved by Biscodyl suppository, give disposable Saline Enema rectally X 1 dose/24 hrs as needed (Do not use constipation standing orders for residents with renal failure/CFR less than 30. Contact MD for orders)(Physician Or  . tiotropium (SPIRIVA HANDIHALER) 18 MCG inhalation capsule Place 1 capsule (18 mcg total) into inhaler and inhale daily.  . vitamin C (ASCORBIC ACID) 500 MG tablet Take 1 tablet (500 mg total) by mouth daily.   No facility-administered  encounter medications on file as of 03/29/2019.    Review of Systems  GENERAL: No change in appetite, no fatigue, no weight changes, no fever, chills or weakness SKIN: Denies rash, itching, wounds, ulcer sores, or nail abnormalities EYES: Denies change in vision, dry eyes, eye pain, itching or discharge EARS: Denies change in hearing, ringing in ears, or earache NOSE: Denies nasal congestion or epistaxis MOUTH and THROAT: Denies oral discomfort, gingival pain or bleeding, pain from teeth or hoarseness   RESPIRATORY:  no cough, SOB, DOE, wheezing, hemoptysis CARDIAC: No chest pain, edema or palpitations GI: No abdominal pain, diarrhea, constipation, heart burn, nausea or vomiting GU: Denies dysuria, frequency, hematuria, incontinence, or discharge MUSCULOSKELETAL: Denies joint pain, muscle pain, back pain, restricted movement, or unusual weakness CIRCULATION: Denies claudication, edema of legs, varicosities, or cold extremities NEUROLOGICAL: Denies dizziness, syncope, numbness, or headache PSYCHIATRIC: Denies feelings of depression or anxiety. No report of hallucinations, insomnia, paranoia, or agitation ENDOCRINE: Denies polyphagia, polyuria, polydipsia, heat or cold intolerance HEME/LYMPH: Denies excessive bruising, petechia, enlarged lymph nodes, or bleeding problems IMMUNOLOGIC: Denies history of frequent infections, AIDS, or use of immunosuppressive agents   Immunization History  Administered Date(s) Administered  . Influenza-Unspecified 12/14/2018   Pertinent  Health Maintenance Due  Topic Date Due  . PNA vac Low Risk Adult (1 of 2 - PCV13) 03/01/2020 (Originally 12/03/2005)  . INFLUENZA VACCINE  Completed   No flowsheet data found.   Vitals:   03/29/19 1106  BP: 113/66  Pulse: (!) 110  Resp: (!) 24  Temp: 97.7 F (36.5 C)  TempSrc: Oral  SpO2: 97%  Weight: 136 lb 9.6 oz (62 kg)  Height: 5\' 11"  (1.803 m)   Body mass index is 19.05 kg/m.  Physical Exam  GENERAL APPEARANCE: Well nourished. In no acute distress. Normal body habitus SKIN:  Skin is warm and dry. There are no suspicious lesions or rash HEAD: Normal in size and contour. No evidence of trauma EYES: Lids open and close normally. No blepharitis, entropion or ectropion. PERRL. Conjunctivae are clear and sclerae are white. Lenses are without opacity EARS: Pinnae are normal. Patient hears normal voice tunes of the examiner MOUTH and THROAT: Lips are without lesions. Oral mucosa is moist and without lesions. Tongue is normal  in shape, size, and color and without lesions NECK: supple, trachea midline, no neck masses, no thyroid tenderness, no thyromegaly LYMPHATICS: No LAN in the neck, no supraclavicular LAN RESPIRATORY: Breathing is even & unlabored, BS CTAB CARDIAC: RRR, no murmur,no extra heart sounds, no edema GI: Abdomen soft, normal BS, no masses, no tenderness, no hepatomegaly, no splenomegaly MUSCULOSKELETAL: No deformities. Movement at each extremity is full and painless. Strength is 5/5 at each extremity. Back is without kyphosis or scoliosis CIRCULATION: Pedal pulses are 2+. There is no edema of the legs, ankles and feet NEUROLOGICAL: There is no tremor. Speech is clear PSYCHIATRIC: Alert and oriented X 3. Affect and behavior are appropriate  Labs reviewed: Recent Labs    02/20/19 0000  NA 139  K 4.9  CL 99  CO2 27*  BUN 23*  CREATININE 0.7  CALCIUM 9.1   No results for input(s): AST, ALT, ALKPHOS, BILITOT, PROT, ALBUMIN in the last 8760 hours. Recent Labs    02/20/19 0000  WBC 9.8  HGB 12.3*  HCT 37*  PLT 560*   No results found for: TSH No results found for: HGBA1C No results found for: CHOL, HDL, LDLCALC, LDLDIRECT, TRIG, CHOLHDL  Significant Diagnostic Results in last  30 days:  No results found.  Assessment/Plan    Family/ staff Communication:   Labs/tests ordered:    Goals of care:      Durenda Age, DNP, FNP-BC [facility] [phone] (Monday-Friday 8:00 a.m. - 5:00 p.m.) [phone] (after hours)  This encounter was created in error - please disregard.

## 2019-04-09 ENCOUNTER — Non-Acute Institutional Stay (SKILLED_NURSING_FACILITY): Payer: Medicare Other | Admitting: Adult Health

## 2019-04-09 ENCOUNTER — Encounter: Payer: Self-pay | Admitting: Adult Health

## 2019-04-09 DIAGNOSIS — I2510 Atherosclerotic heart disease of native coronary artery without angina pectoris: Secondary | ICD-10-CM | POA: Diagnosis not present

## 2019-04-09 DIAGNOSIS — I10 Essential (primary) hypertension: Secondary | ICD-10-CM | POA: Diagnosis not present

## 2019-04-09 DIAGNOSIS — M62838 Other muscle spasm: Secondary | ICD-10-CM | POA: Diagnosis not present

## 2019-04-09 DIAGNOSIS — U071 COVID-19: Secondary | ICD-10-CM

## 2019-04-09 DIAGNOSIS — J449 Chronic obstructive pulmonary disease, unspecified: Secondary | ICD-10-CM

## 2019-04-09 DIAGNOSIS — E876 Hypokalemia: Secondary | ICD-10-CM

## 2019-04-09 NOTE — Progress Notes (Signed)
Location:  [facility] [place] Room Number: 125/B Place of Service:  SNF (31) Provider:  Durenda Age, DNP, FNP-BC  Patient Care Team: Hendricks Limes, MD as PCP - General (Internal Medicine)  Extended Emergency Contact Information Primary Emergency Contact: Brodie, Correll Mobile Phone: [phone] Relation: Daughter  Code Status:  Full Code  Goals of care: Advanced Directive information Advanced Directives 04/09/2019  Does Patient Have a Medical Advance Directive? Yes  Type of Advance Directive (No Data)  Does patient want to make changes to medical advance directive? No - Patient declined     Chief Complaint  Patient presents with  . Medical Management of Chronic Issues    Routine visit of medical management    HPI:  Pt is a 79 y.o. male seen today for medical management of chronic diseases. He has PMH of hypertension, seizure disorder, history of left BKA, resting tremor, COPD, senile purpura, hypercholesterolemia and Vitamin D deficiency. He was seen in his room today. Rapid test for COVID-19 was done on him today. He tested positive. No SOB, cough, fever nor body aches.  He was supposed to transfer to [facility] but he decided to stay at [facility].    Past Medical History:  Diagnosis Date  . CAD (coronary artery disease)   . COPD (chronic obstructive pulmonary disease) ([employer])   . Depression   . Essential hypertension   . Seizure disorder [facility])    Past Surgical History:  Procedure Laterality Date  . BELOW KNEE LEG AMPUTATION Left     No Known Allergies  Outpatient Encounter Medications as of 04/09/2019  Medication Sig  . bisacodyl (DULCOLAX) 10 MG suppository If not relieved by MOM, give 10 mg Bisacodyl suppositiory rectally X 1 dose in 24 hours as needed (Do not use constipation standing orders for residents with renal failure/CFR less than 30. Contact MD for orders) (Physician Order)  . bumetanide (BUMEX) 1 MG tablet Take 1 tablet (1 mg  total) by mouth daily.  . cholecalciferol (VITAMIN D) 25 MCG (1000 UT) tablet Take 1 tablet (1,000 Units total) by mouth daily.  . clopidogrel (PLAVIX) 75 MG tablet Take 1 tablet (75 mg total) by mouth daily.  . divalproex (DEPAKOTE ER) 500 MG 24 hr tablet Take 1 tablet (500 mg total) by mouth at bedtime.  . docusate sodium (COLACE) 100 MG capsule Take 1 capsule (100 mg total) by mouth at bedtime.  . feeding supplement, ENSURE ENLIVE, (ENSURE ENLIVE) LIQD Take 237 mLs by mouth 2 (two) times daily between meals. To promote weight gain  . loratadine (CLARITIN) 10 MG tablet Take 1 tablet (10 mg total) by mouth daily.  . magnesium hydroxide (MILK OF MAGNESIA) 400 MG/5ML suspension If no BM in 3 days, give 30 cc Milk of Magnesium p.o. x 1 dose in 24 hours as needed (Do not use standing constipation orders for residents with renal failure CFR less than 30. Contact MD for orders) (Physician Order)  . Melatonin 3 MG TABS Take 3 mg by mouth at bedtime.  . NON FORMULARY Give Magic Cup once a day to promote weight gain  . OXYGEN Inhale 2 L into the lungs. O2 at 2L/min via [place] continuously (portable & stationary oncentrators)  . polyethylene glycol powder (GLYCOLAX/MIRALAX) 17 GM/SCOOP powder Take 17 g by mouth daily.  . potassium chloride SA (KLOR-CON) 20 MEQ tablet Take 1 tablet (20 mEq total) by mouth daily.  . sertraline (ZOLOFT) 100 MG tablet Take 1 tablet (100 mg total) by mouth daily.  Marland Kitchen  Sodium Phosphates (RA SALINE ENEMA RE) If not relieved by Biscodyl suppository, give disposable Saline Enema rectally X 1 dose/24 hrs as needed (Do not use constipation standing orders for residents with renal failure/CFR less than 30. Contact MD for orders)(Physician Or  . tiotropium (SPIRIVA HANDIHALER) 18 MCG inhalation capsule Place 1 capsule (18 mcg total) into inhaler and inhale daily.  . vitamin C (ASCORBIC ACID) 500 MG tablet Take 1 tablet (500 mg total) by mouth daily.   No facility-administered encounter  medications on file as of 04/09/2019.    Review of Systems  GENERAL: No change in appetite, no fatigue, no weight changes, no fever, chills or weakness MOUTH and THROAT: Denies oral discomfort, gingival pain or bleeding RESPIRATORY: no cough, SOB, DOE, wheezing, hemoptysis CARDIAC: No chest pain, edema or palpitations GI: No abdominal pain, diarrhea, constipation, heart burn, nausea or vomiting NEUROLOGICAL: Denies dizziness, syncope, numbness, or headache PSYCHIATRIC: Denies feelings of depression or anxiety. No report of hallucinations, insomnia, paranoia, or agitation   Immunization History  Administered Date(s) Administered  . Influenza-Unspecified 12/14/2018   Pertinent  Health Maintenance Due  Topic Date Due  . PNA vac Low Risk Adult (1 of 2 - PCV13) 03/01/2020 (Originally 12/03/2005)  . INFLUENZA VACCINE  Completed   No flowsheet data found.   Vitals:   04/09/19 1002  BP: 118/72  Pulse: 70  Resp: 20  Temp: 98.2 F (36.8 C)  TempSrc: Oral  SpO2: 97%  Weight: 136 lb 9.6 oz (62 kg)  Height: 5\' 11"  (1.803 m)   Body mass index is 19.05 kg/m.  Physical Exam  GENERAL APPEARANCE:  In no acute distress.  SKIN:  Skin is warm and dry.  MOUTH and THROAT: Lips are without lesions. Oral mucosa is moist and without lesions. Tongue is normal in shape, size, and color and without lesions RESPIRATORY: Breathing is even & unlabored, BS CTAB CARDIAC: RRR, no murmur,no extra heart sounds, no edema GI: Abdomen soft, normal BS, no masses, no tenderness EXTREMITIES:  Left BKA NEUROLOGICAL: + tremor. Speech is clear. Alert to self and time, disoriented to place. PSYCHIATRIC:  Affect and behavior are appropriate  Labs reviewed: Recent Labs    02/20/19 0000  NA 139  K 4.9  CL 99  CO2 27*  BUN 23*  CREATININE 0.7  CALCIUM 9.1   No results for input(s): AST, ALT, ALKPHOS, BILITOT, PROT, ALBUMIN in the last 8760 hours. Recent Labs    02/20/19 0000  WBC 9.8  HGB 12.3*    HCT 37*  PLT 560*    Assessment/Plan  1. Lab test positive for detection of COVID-19 virus - he was supposed to have Tdap vaccine today but since he has tested positive for COVID-19, will postpone it for now and monitor for signs and symptoms for COVID-19 such as SOB, fever, body aches, etc.  2. Coronary artery disease involving native coronary artery of native heart without angina pectoris -No complaints of chest pains, continue Plavix  3. Essential hypertension -Stable, continue bumetanide  4. Hypokalemia Lab Results  Component Value Date   K 4.9 02/20/2019  -Continue KCl ER supplementation  5. Muscle spasm/History of left BKA -Denies pain, continue supportive care and fall precautions  6. Chronic obstructive pulmonary disease, unspecified COPD type (HCC) - no SOB, stable, continue Spiriva     Family/ staff Communication: Discussed plan of care with resident.  Labs/tests ordered:   None  Goals of care:   Long-term care   14/10/2018, DNP, FNP-BC [facility] [phone] (Monday-Friday 8:00 a.m. - 5:00 p.m.) [phone] (after hours)

## 2019-04-10 ENCOUNTER — Encounter: Payer: Self-pay | Admitting: Internal Medicine

## 2019-04-10 ENCOUNTER — Non-Acute Institutional Stay (SKILLED_NURSING_FACILITY): Payer: Medicare Other | Admitting: Internal Medicine

## 2019-04-10 DIAGNOSIS — J449 Chronic obstructive pulmonary disease, unspecified: Secondary | ICD-10-CM | POA: Diagnosis not present

## 2019-04-10 DIAGNOSIS — U071 COVID-19: Secondary | ICD-10-CM

## 2019-04-10 DIAGNOSIS — I10 Essential (primary) hypertension: Secondary | ICD-10-CM

## 2019-04-10 DIAGNOSIS — G40909 Epilepsy, unspecified, not intractable, without status epilepticus: Secondary | ICD-10-CM | POA: Diagnosis not present

## 2019-04-10 NOTE — Assessment & Plan Note (Signed)
BP controlled; no change in antihypertensive medications  

## 2019-04-10 NOTE — Progress Notes (Signed)
NURSING HOME LOCATION:  [facility] ROOM NUMBER: [id number]  CODE STATUS: Full code  PCP: No PCP  This is a nursing facility follow up for specific acute issue of positive screening for Covid.  Interim medical record and care since last [place] visit was updated with review of diagnostic studies and change in clinical status since last visit were documented.  HPI: He was admitted to the SNF on 11/30 after being hospitalized for approximately 1 week with acute mental status changes with encephalopathy and generalized weakness.  This was in the context of UTI with ESBL E. coli and possible right pneumonia in the context of emphysema.  Other medical diagnoses include essential hypertension seizure disorder, depression, and CAD.  Surgeries include left BKA.  Review of systems: He gave the date as March 30, 2019.  He was very terse with his review of systems responses.  He does validate poor appetite indicating "sometimes I eat, sometimes I don't".  He describes minimal cough with minimal sputum production.  He describes some muscle discomfort in his legs.  It is to be noted that he has a BKA on the left.  Constitutional: No fever, significant weight change, fatigue  Eyes: No redness, discharge, pain, vision change ENT/mouth: No nasal congestion,  purulent discharge, earache, change in hearing, sore throat  Cardiovascular: No chest pain, palpitations, paroxysmal nocturnal dyspnea, claudication, edema  Respiratory: No hemoptysis,  significant snoring, apnea   Gastrointestinal: No heartburn, dysphagia, abdominal pain, nausea /vomiting, rectal bleeding, melena, change in bowels Genitourinary: No dysuria, hematuria, pyuria, incontinence, nocturia Musculoskeletal: No joint stiffness, joint swelling, weakness, pain Dermatologic: No rash, pruritus, change in appearance of skin Neurologic: No dizziness, headache, syncope, seizures, numbness, tingling Psychiatric: No significant anxiety,  depression, insomnia Endocrine: No change in hair/skin/nails, excessive thirst, excessive hunger, excessive urination  Hematologic/lymphatic: No significant bruising, lymphadenopathy, abnormal bleeding Allergy/immunology: No itchy/watery eyes, significant sneezing, urticaria, angioedema  Physical exam:  Pertinent or positive findings: He is thin and appears suboptimally nourished.  Hair is thin.  The right pupil is larger than the left.  This appears to be postoperative in nature.  Breath sounds are decreased.  Heart sounds are distant.  Pedal pulses are decreased in the right lower extremity.  BKA is present on the left.  He is generally weak to opposition more so on the left than the right. There is very faint facial erythema in the distribution under the oxygen nasal cannula and catheter at his neck.  There is no urticaria.  He exhibits intermittent tremor of the left hand.  General appearance: no acute distress, increased work of breathing is present.   Lymphatic: No lymphadenopathy about the head, neck, axilla. Eyes: No conjunctival inflammation or lid edema is present. There is no scleral icterus. Ears:  External ear exam shows no significant lesions or deformities.   Nose:  External nasal examination shows no deformity or inflammation. Nasal mucosa are pink and moist without lesions, exudates Oral exam:  There is no oropharyngeal erythema or exudate. Neck:  No thyromegaly, masses, tenderness noted.    Heart:  No gallop, murmur, click, rub .  Lungs: without wheezes, rhonchi, rales, rubs. Abdomen: Bowel sounds are normal. Abdomen is soft and nontender with no organomegaly, hernias, masses. GU: Deferred  Extremities:  No cyanosis, clubbing, edema  Neurologic exam : Balance, Rhomberg, finger to nose testing could not be completed due to clinical state Skin: Warm & dry w/o tenting. No significant lesions .  See summary under each active problem  in the Problem List with associated updated  therapeutic plan

## 2019-04-10 NOTE — Assessment & Plan Note (Signed)
Clinically no exacerbation on present regimen.  Adequate oxygen saturations despite positive PCR for Covid.

## 2019-04-10 NOTE — Patient Instructions (Signed)
See assessment and plan under each diagnosis in the problem list and acutely for this visit 

## 2019-04-10 NOTE — Assessment & Plan Note (Signed)
Staff reports no seizure activity

## 2019-04-11 ENCOUNTER — Non-Acute Institutional Stay (SKILLED_NURSING_FACILITY): Payer: Medicare Other | Admitting: Adult Health

## 2019-04-11 ENCOUNTER — Encounter: Payer: Self-pay | Admitting: Adult Health

## 2019-04-11 DIAGNOSIS — J449 Chronic obstructive pulmonary disease, unspecified: Secondary | ICD-10-CM

## 2019-04-11 DIAGNOSIS — Z7189 Other specified counseling: Secondary | ICD-10-CM

## 2019-04-11 DIAGNOSIS — E876 Hypokalemia: Secondary | ICD-10-CM

## 2019-04-11 DIAGNOSIS — I10 Essential (primary) hypertension: Secondary | ICD-10-CM | POA: Diagnosis not present

## 2019-04-11 DIAGNOSIS — I2510 Atherosclerotic heart disease of native coronary artery without angina pectoris: Secondary | ICD-10-CM | POA: Diagnosis not present

## 2019-04-11 DIAGNOSIS — G4709 Other insomnia: Secondary | ICD-10-CM

## 2019-04-11 NOTE — Progress Notes (Signed)
Location:  [place] Room Number: 307/A Place of Service:  SNF (31) Provider:  Durenda Age, DNP, FNP-BC  Patient Care Team: Hendricks Limes, MD as PCP - General (Internal Medicine)  Extended Emergency Contact Information Primary Emergency Contact: Yoav, [place]ane Mobile Phone: [phone] Relation: Daughter  Code Status:  Full Code  Goals of care: Advanced Directive information Advanced Directives 04/11/2019  Does Patient Have a Medical Advance Directive? Yes  Type of Advance Directive (No Data)  Does patient want to make changes to medical advance directive? No - Patient declined     Chief Complaint  Patient presents with  . Advanced Directive    Advance Care Planning    HPI:  Pt is a 79 y.o. male who had advance care planning today attended by NP, MDS coordinator, dietician, social worker and resident's daughter, Baruch Gouty,  who attended via teleconference. Resident was invited to the meeting but declined. Code status remains to be full code. Discussed medications, vital signs and weights. Resident is in isolation due to a postive COVID-19 Ag.He eats 25-50% of his meals. He had 2.6 lbs weight loss in a month. He is now a long-term care resident of [facility]. The meeting lasted for 25 minutes.   Past Medical History:  Diagnosis Date  . CAD (coronary artery disease)   . COPD (chronic obstructive pulmonary disease) ([place])   . Depression   . Essential hypertension   . Seizure disorder [employer])    Past Surgical History:  Procedure Laterality Date  . BELOW KNEE LEG AMPUTATION Left     No Known Allergies  Outpatient Encounter Medications as of 04/11/2019  Medication Sig  . bisacodyl (DULCOLAX) 10 MG suppository If not relieved by MOM, give 10 mg Bisacodyl suppositiory rectally X 1 dose in 24 hours as needed (Do not use constipation standing orders for residents with renal failure/CFR less than 30. Contact MD for  orders) (Physician Order)  . bumetanide (BUMEX) 1 MG tablet Take 1 tablet (1 mg total) by mouth daily.  . cholecalciferol (VITAMIN D) 25 MCG (1000 UT) tablet Take 1 tablet (1,000 Units total) by mouth daily.  . clopidogrel (PLAVIX) 75 MG tablet Take 1 tablet (75 mg total) by mouth daily.  . divalproex (DEPAKOTE ER) 500 MG 24 hr tablet Take 1 tablet (500 mg total) by mouth at bedtime.  . docusate sodium (COLACE) 100 MG capsule Take 1 capsule (100 mg total) by mouth at bedtime.  . feeding supplement, ENSURE ENLIVE, (ENSURE ENLIVE) LIQD Take 237 mLs by mouth 2 (two) times daily between meals. To promote weight gain  . loratadine (CLARITIN) 10 MG tablet Take 1 tablet (10 mg total) by mouth daily.  . magnesium hydroxide (MILK OF MAGNESIA) 400 MG/5ML suspension If no BM in 3 days, give 30 cc Milk of Magnesium p.o. x 1 dose in 24 hours as needed (Do not use standing constipation orders for residents with renal failure CFR less than 30. Contact MD for orders) (Physician Order)  . Melatonin 3 MG TABS Take 3 mg by mouth at bedtime.  . NON FORMULARY Give Magic Cup once a day to promote weight gain  . OXYGEN Inhale 2 L into the lungs. O2 at 2L/min via [place] continuously (portable & stationary oncentrators)  . polyethylene glycol powder (GLYCOLAX/MIRALAX) 17 GM/SCOOP powder Take 17 g by mouth daily.  . potassium chloride SA (KLOR-CON) 20 MEQ tablet Take 1 tablet (20 mEq total) by mouth daily.  . sertraline (ZOLOFT) 100 MG  tablet Take 1 tablet (100 mg total) by mouth daily.  . Sodium Phosphates (RA SALINE ENEMA RE) If not relieved by Biscodyl suppository, give disposable Saline Enema rectally X 1 dose/24 hrs as needed (Do not use constipation standing orders for residents with renal failure/CFR less than 30. Contact MD for orders)(Physician Or  . tiotropium (SPIRIVA HANDIHALER) 18 MCG inhalation capsule Place 1 capsule (18 mcg total) into inhaler and inhale daily.  . vitamin C (ASCORBIC ACID) 500 MG tablet Take 1  tablet (500 mg total) by mouth daily.   No facility-administered encounter medications on file as of 04/11/2019.    Review of Systems  GENERAL: No change in appetite, no fatigue, no weight changes, no fever, chills or weakness MOUTH and THROAT: Denies oral discomfort, gingival pain or bleeding RESPIRATORY: no cough, SOB, DOE, wheezing, hemoptysis CARDIAC: No chest pain, edema or palpitations GI: No abdominal pain, diarrhea, constipation, heart burn, nausea or vomiting GU: Denies dysuria, frequency, hematuria, incontinence, or discharge NEUROLOGICAL: Denies dizziness, syncope, numbness, or headache PSYCHIATRIC: Denies feelings of depression or anxiety. No report of hallucinations, insomnia, paranoia, or agitation   Immunization History  Administered Date(s) Administered  . Influenza-Unspecified 12/14/2018   Pertinent  Health Maintenance Due  Topic Date Due  . PNA vac Low Risk Adult (1 of 2 - PCV13) 03/01/2020 (Originally 12/03/2005)  . INFLUENZA VACCINE  Completed   No flowsheet data found.   Vitals:   04/11/19 1339  BP: 104/65  Pulse: 81  Resp: 17  Temp: 97.9 F (36.6 C)  TempSrc: Oral  SpO2: 97%  Weight: 136 lb 9.6 oz (62 kg)  Height: 5\' 11"  (1.803 m)   Body mass index is 19.05 kg/m.  Physical Exam  GENERAL APPEARANCE:  In no acute distress.  SKIN:  Skin is warm and dry.  MOUTH and THROAT: Lips are without lesions. Oral mucosa is moist and without lesions. Tongue is normal in shape, size, and color and without lesions RESPIRATORY: Breathing is even & unlabored, BS CTAB CARDIAC: RRR, no murmur,no extra heart sounds, no edema GI: Abdomen soft, normal BS, no masses, no tenderness EXTREMITIES:  Left BKA NEUROLOGICAL: There is no tremor. Speech is clear PSYCHIATRIC:  Affect and behavior are appropriate  Labs reviewed: Recent Labs    02/20/19 0000  NA 139  K 4.9  CL 99  CO2 27*  BUN 23*  CREATININE 0.7  CALCIUM 9.1    Recent Labs    02/20/19 0000  WBC  9.8  HGB 12.3*  HCT 37*  PLT 560*    Assessment/Plan  1. Advance care planning - remains to be full code - discussed medications, vital signs and weights  2. Coronary artery disease involving native coronary artery of native heart without angina pectoris - no chest pains, continue Plavix  3. Chronic obstructive pulmonary disease, unspecified COPD type (HCC) - no SOB, continue O2@ 2L/min via [place] and Spiriva  4. Essential hypertension - stable, continue Bumetanide  5. Hypokalemia Lab Results  Component Value Date   K 4.9 02/20/2019  - continue KCL ER  6. Other insomnia - verbalized sleeping adequately at night, continue Melatonin  7. Major depression, recurrent (HCC) - mood is stable, continue 14/10/2018 Communication:  Discussed plan of care with daughter and IDT.  Labs/tests ordered:  None  Goals of care:  Long-term care    Art gallery manager, DNP, FNP-BC [facility] [phone] (Monday-Friday 8:00 a.m. - 5:00 p.m.) [phone] (after hours)

## 2019-04-16 LAB — BASIC METABOLIC PANEL
BUN: 29 — AB (ref 4–21)
CO2: 27 — AB (ref 13–22)
Chloride: 93 — AB (ref 99–108)
Creatinine: 0.7 (ref 0.6–1.3)
Glucose: 144
Potassium: 4.9 (ref 3.4–5.3)
Sodium: 133 — AB (ref 137–147)

## 2019-04-16 LAB — COMPREHENSIVE METABOLIC PANEL
Calcium: 9 (ref 8.7–10.7)
GFR calc Af Amer: >90
GFR calc non Af Amer: 88.62

## 2019-04-16 LAB — CBC AND DIFFERENTIAL
HCT: 37 — AB (ref 41–53)
Hemoglobin: 12.7 — AB (ref 13.5–17.5)
Platelets: 417 — AB (ref 150–399)
WBC: 12.1

## 2019-04-16 LAB — CBC: RBC: 4.17 (ref 3.87–5.11)

## 2019-04-17 ENCOUNTER — Non-Acute Institutional Stay (SKILLED_NURSING_FACILITY): Payer: Medicare Other | Admitting: Adult Health

## 2019-04-17 ENCOUNTER — Encounter: Payer: Self-pay | Admitting: Adult Health

## 2019-04-17 DIAGNOSIS — R638 Other symptoms and signs concerning food and fluid intake: Secondary | ICD-10-CM | POA: Diagnosis not present

## 2019-04-17 DIAGNOSIS — J449 Chronic obstructive pulmonary disease, unspecified: Secondary | ICD-10-CM | POA: Diagnosis not present

## 2019-04-17 DIAGNOSIS — U071 COVID-19: Secondary | ICD-10-CM | POA: Diagnosis not present

## 2019-04-17 DIAGNOSIS — E861 Hypovolemia: Secondary | ICD-10-CM

## 2019-04-17 DIAGNOSIS — I9589 Other hypotension: Secondary | ICD-10-CM

## 2019-04-17 DIAGNOSIS — J9601 Acute respiratory failure with hypoxia: Secondary | ICD-10-CM | POA: Diagnosis not present

## 2019-04-17 NOTE — Progress Notes (Signed)
Location:  [facility] Room Number: 301/A Place of Service:  SNF (31) Provider:  Durenda Age, DNP, FNP-BC  Patient Care Team: Hendricks Limes, MD as PCP - General (Internal Medicine)  Extended Emergency Contact Information Primary Emergency Contact: Cebert, Dettmann Mobile Phone: [phone] Relation: Daughter  Code Status:  Full Code  Goals of care: Advanced Directive information Advanced Directives 04/17/2019  Does Patient Have a Medical Advance Directive? Yes  Type of Advance Directive (No Data)  Does patient want to make changes to medical advance directive? No - Patient declined     Chief Complaint  Patient presents with  . Acute Visit    Not Eating    HPI:  Pt is a 79 y.o. male seen today for poor oral intake reported by staff. BMP done on 04/16/19 showed sodium 133, glucose 144, creatinine 0.73 BUN 28.7 K 4.9 eGFR 88.62. CBC showed wbc 12.1, hgb 12.7 platelet 417 and hct 37.4. He was recently diagnosed with COVID-19 and currently on Prednisone and Azithromycin. He was seen in his room today. He was reported to be having O2 sat of 86% and HR 116 during therapy.  Re-check showed O2 sat 90% with O2 at 2L/min via [place] and HR 106. He denies having SOB.     Past Medical History:  Diagnosis Date  . CAD (coronary artery disease)   . COPD (chronic obstructive pulmonary disease) ([place])   . Depression   . Essential hypertension   . Seizure disorder [facility])    Past Surgical History:  Procedure Laterality Date  . BELOW KNEE LEG AMPUTATION Left     No Known Allergies  Outpatient Encounter Medications as of 04/17/2019  Medication Sig  . aspirin EC 81 MG tablet Take 81 mg by mouth daily.  Marland Kitchen azithromycin (ZITHROMAX) 250 MG tablet Take by mouth daily.  . bisacodyl (DULCOLAX) 10 MG suppository If not relieved by MOM, give 10 mg Bisacodyl suppositiory rectally X 1 dose in 24 hours as needed (Do not use constipation standing orders for residents with renal  failure/CFR less than 30. Contact MD for orders) (Physician Order)  . bumetanide (BUMEX) 1 MG tablet Take 1 tablet (1 mg total) by mouth daily.  . cholecalciferol (VITAMIN D) 25 MCG (1000 UT) tablet Take 1 tablet (1,000 Units total) by mouth daily.  . clopidogrel (PLAVIX) 75 MG tablet Take 1 tablet (75 mg total) by mouth daily.  . divalproex (DEPAKOTE ER) 500 MG 24 hr tablet Take 1 tablet (500 mg total) by mouth at bedtime.  . docusate sodium (COLACE) 100 MG capsule Take 1 capsule (100 mg total) by mouth at bedtime.  . feeding supplement, ENSURE ENLIVE, (ENSURE ENLIVE) LIQD Take 237 mLs by mouth 2 (two) times daily between meals. To promote weight gain  . guaiFENesin (ROBITUSSIN) 100 MG/5ML liquid Give 10 ml by mputh eveyr six hours x 5 days for COVD 19  . loratadine (CLARITIN) 10 MG tablet Take 1 tablet (10 mg total) by mouth daily.  . magnesium hydroxide (MILK OF MAGNESIA) 400 MG/5ML suspension If no BM in 3 days, give 30 cc Milk of Magnesium p.o. x 1 dose in 24 hours as needed (Do not use standing constipation orders for residents with renal failure CFR less than 30. Contact MD for orders) (Physician Order)  . Melatonin 3 MG TABS Take 3 mg by mouth at bedtime.  . NON FORMULARY Give Magic Cup once a day to promote weight gain  . OXYGEN Inhale 2 L into the  lungs. O2 at 2L/min via [place] continuously (portable & stationary oncentrators)  . polyethylene glycol powder (GLYCOLAX/MIRALAX) 17 GM/SCOOP powder Take 17 g by mouth daily.  . potassium chloride SA (KLOR-CON) 20 MEQ tablet Take 1 tablet (20 mEq total) by mouth daily.  . predniSONE (DELTASONE) 10 MG tablet Take 30 mg by mouth 2 (two) times daily with a meal.  . sertraline (ZOLOFT) 100 MG tablet Take 1 tablet (100 mg total) by mouth daily.  . Sodium Phosphates (RA SALINE ENEMA RE) If not relieved by Biscodyl suppository, give disposable Saline Enema rectally X 1 dose/24 hrs as needed (Do not use constipation standing orders for residents with renal  failure/CFR less than 30. Contact MD for orders)(Physician Or  . tiotropium (SPIRIVA HANDIHALER) 18 MCG inhalation capsule Place 1 capsule (18 mcg total) into inhaler and inhale daily.  . vitamin C (ASCORBIC ACID) 500 MG tablet Take 1 tablet (500 mg total) by mouth daily.  Marland Kitchen zinc sulfate 220 (50 Zn) MG capsule Take 220 mg by mouth daily.   No facility-administered encounter medications on file as of 04/17/2019.    Review of Systems  GENERAL: No change in appetite, no fatigue, no fever, chills or weakness MOUTH and THROAT: Denies oral discomfort, gingival pain or bleeding RESPIRATORY: +cough CARDIAC: No chest pain, edema or palpitations GI: No abdominal pain, diarrhea, constipation, heart burn, nausea or vomiting GU: Denies dysuria, frequency, hematuria, incontinence, or discharge NEUROLOGICAL: Denies dizziness, syncope, numbness, or headache PSYCHIATRIC: Denies feelings of depression or anxiety. No report of hallucinations, insomnia, paranoia, or agitation   Immunization History  Administered Date(s) Administered  . Influenza-Unspecified 12/14/2018   Pertinent  Health Maintenance Due  Topic Date Due  . PNA vac Low Risk Adult (1 of 2 - PCV13) 03/01/2020 (Originally 12/03/2005)  . INFLUENZA VACCINE  Completed   No flowsheet data found.   Vitals:   04/17/19 1417  BP: 132/74  Pulse: 68  Resp: 16  Temp: (!) 97.5 F (36.4 C)  TempSrc: Oral  SpO2: 97%  Weight: 136 lb 9.6 oz (62 kg)  Height: '5\' 11"'  (1.803 m)   Body mass index is 19.05 kg/m.  Physical Exam  GENERAL APPEARANCE:  In no acute distress.  SKIN:  Skin is warm and dry.  MOUTH and THROAT: Lips are without lesions. Oral mucosa is moist and without lesions. Tongue is normal in shape, size, and color and without lesions RESPIRATORY: Breathing is even & unlabored, BS CTAB CARDIAC: RRR, no murmur,no extra heart sounds, no edema GI: Abdomen soft, normal BS, no masses, no tenderness EXTREMITIES:  Left BKA NEUROLOGICAL:  There is no tremor. Speech is clear. PSYCHIATRIC:  Affect and behavior are appropriate  Labs reviewed: Recent Labs    02/20/19 0000  NA 139  139  K 4.9  CL 99  99  CO2 27*  27*  BUN 33*  23*  CREATININE 0.7  0.7  CALCIUM 9.1  9.1    Recent Labs    02/20/19 0000  WBC 9.8  9.8  HGB 12.3*  12.3*  HCT 37*  37*  PLT 560*  560*    Assessment/Plan  1. Acute respiratory failure with hypoxia (HCC) - will continue O2 @ 2L/min via [place] continuously, start PRN albuterol inhaler  2. Real time reverse transcriptase PCR positive for COVID-19 virus -Continue azithromycin x5 more days and Florastor twice daily till 03/26/2019, decrease prednisone to 20 mg twice a day x5 days, continue aspirin EC 81 mg x 10 more days, zinc sulfate  x5 more days and guaifenesin x5 more days  3. Poor fluid intake - will start 0.9NA @ 75 ml/hour Via [place] X 1L for hydration  4. Chronic obstructive pulmonary disease, unspecified COPD type (HCC) -Continue Spiriva and PRN albuterol  5. Hypotension due to hypovolemia - will decrease Bumetanide from 1 mg daily to 0.5 mg dail, hold for SBP <105     Family/ staff Communication: Discussed plan of care with resident and charge nurse.  Labs/tests ordered:  BMP and CBC  Goals of care:   Long-term care   Durenda Age, DNP, FNP-BC [facility] and Adult Medicine [phone] (Monday-Friday 8:00 a.m. - 5:00 p.m.) [phone] (after hours)

## 2019-04-19 LAB — COMPREHENSIVE METABOLIC PANEL
Calcium: 8.6 — AB (ref 8.7–10.7)
GFR calc Af Amer: >90
GFR calc non Af Amer: >90

## 2019-04-19 LAB — BASIC METABOLIC PANEL
BUN: 24 — AB (ref 4–21)
CO2: 24 — AB (ref 13–22)
Chloride: 99 (ref 99–108)
Creatinine: 0.6 (ref 0.6–1.3)
Glucose: 130
Potassium: 4.5 (ref 3.4–5.3)
Sodium: 138 (ref 137–147)

## 2019-04-20 LAB — CBC: RBC: 3.89 (ref 3.87–5.11)

## 2019-04-20 LAB — CBC AND DIFFERENTIAL
HCT: 35 — AB (ref 41–53)
Hemoglobin: 11.8 — AB (ref 13.5–17.5)
Platelets: 603 — AB (ref 150–399)
WBC: 15.5

## 2019-04-23 LAB — BASIC METABOLIC PANEL
BUN: 19 (ref 4–21)
CO2: 27 — AB (ref 13–22)
Chloride: 100 (ref 99–108)
Creatinine: 0.6 (ref 0.6–1.3)
Glucose: 110
Potassium: 4.9 (ref 3.4–5.3)
Sodium: 138 (ref 137–147)

## 2019-04-23 LAB — COMPREHENSIVE METABOLIC PANEL
Calcium: 8.7 (ref 8.7–10.7)
GFR calc Af Amer: >90
GFR calc non Af Amer: >90

## 2019-04-24 ENCOUNTER — Telehealth: Payer: Self-pay | Admitting: Family

## 2019-04-24 ENCOUNTER — Non-Acute Institutional Stay (SKILLED_NURSING_FACILITY): Payer: Medicare Other | Admitting: Adult Health

## 2019-04-24 ENCOUNTER — Encounter: Payer: Self-pay | Admitting: Adult Health

## 2019-04-24 DIAGNOSIS — J449 Chronic obstructive pulmonary disease, unspecified: Secondary | ICD-10-CM

## 2019-04-24 DIAGNOSIS — U071 COVID-19: Secondary | ICD-10-CM

## 2019-04-24 DIAGNOSIS — J9601 Acute respiratory failure with hypoxia: Secondary | ICD-10-CM

## 2019-04-24 NOTE — Progress Notes (Signed)
Location:  [facility] Room Number: 128/A Place of Service:  SNF (31) Provider:  Durenda Age, DNP, FNP-BC  Patient Care Team: Hendricks Limes, MD as PCP - General (Internal Medicine)  Extended Emergency Contact Information Primary Emergency Contact: Ahren, Pettinger Mobile Phone: [phone] Relation: Daughter  Code Status:  Full Code  Goals of care: Advanced Directive information Advanced Directives 04/24/2019  Does Patient Have a Medical Advance Directive? Yes  Type of Advance Directive (No Data)  Does patient want to make changes to medical advance directive? No - Patient declined     Chief Complaint  Patient presents with  . Acute Visit    Shortness of Breath    HPI:   Pt is a 79 y.o. male who was reported to have 86% O2 saturation even while on O2 @ 3L/min via [place] last night. On call provider was called and chest x-ray was ordered. Chest x-ray showed bilateral infrahilar airspace opacification. He has completed 10 days of Azithromycin for COVID-19. Prednisone was recently changed to Dexamethasone.  He was seen in his room today. He verbalized feeling better. O2 sat checked while he briefly took O2 off due to changing of clothes, was 89-93%. He was then hooked back to O2 @ 3L/min.  Past Medical History:  Diagnosis Date  . CAD (coronary artery disease)   . COPD (chronic obstructive pulmonary disease) ([place])   . Depression   . Essential hypertension   . Seizure disorder [facility])    Past Surgical History:  Procedure Laterality Date  . BELOW KNEE LEG AMPUTATION Left     No Known Allergies  Outpatient Encounter Medications as of 04/24/2019  Medication Sig  . albuterol (VENTOLIN HFA) 108 (90 Base) MCG/ACT inhaler Inhale 2 puffs into the lungs every 6 (six) hours as needed for shortness of breath (FOR Shortness of Breath  (S.O.B.)).  Marland Kitchen aspirin EC 81 MG tablet Take 81 mg by mouth daily.  . bisacodyl (DULCOLAX) 10 MG suppository If not relieved  by MOM, give 10 mg Bisacodyl suppositiory rectally X 1 dose in 24 hours as needed (Do not use constipation standing orders for residents with renal failure/CFR less than 30. Contact MD for orders) (Physician Order)  . cholecalciferol (VITAMIN D) 25 MCG (1000 UT) tablet Take 1 tablet (1,000 Units total) by mouth daily.  . clopidogrel (PLAVIX) 75 MG tablet Take 1 tablet (75 mg total) by mouth daily.  Marland Kitchen dexamethasone (DECADRON) 2 MG tablet Take 6 mg by mouth daily. (d/c prednisone when Dexamethasone is available)  . divalproex (DEPAKOTE ER) 500 MG 24 hr tablet Take 1 tablet (500 mg total) by mouth at bedtime.  . docusate sodium (COLACE) 100 MG capsule Take 1 capsule (100 mg total) by mouth at bedtime.  . feeding supplement, ENSURE ENLIVE, (ENSURE ENLIVE) LIQD Take 237 mLs by mouth 2 (two) times daily between meals. To promote weight gain  . guaiFENesin (ROBITUSSIN) 100 MG/5ML liquid Take 100 mg by mouth 4 (four) times daily as needed for cough.  . loratadine (CLARITIN) 10 MG tablet Take 1 tablet (10 mg total) by mouth daily.  . magnesium hydroxide (MILK OF MAGNESIA) 400 MG/5ML suspension If no BM in 3 days, give 30 cc Milk of Magnesium p.o. x 1 dose in 24 hours as needed (Do not use standing constipation orders for residents with renal failure CFR less than 30. Contact MD for orders) (Physician Order)  . Melatonin 3 MG TABS Take 3 mg by mouth at bedtime.  Marland Kitchen NON  FORMULARY Give Magic Cup once a day to promote weight gain  . NON FORMULARY Add 120 ML med pass two times a day  . OXYGEN Inhale 2 L into the lungs. O2 at 2L/min via [place] continuously (portable & stationary oncentrators)  . polyethylene glycol powder (GLYCOLAX/MIRALAX) 17 GM/SCOOP powder Take 17 g by mouth daily.  . potassium chloride SA (KLOR-CON) 20 MEQ tablet Take 1 tablet (20 mEq total) by mouth daily.  . Saccharomyces boulardii (PROBIOTIC) 250 MG CAPS Take 250 mg by mouth. TAKE 1 CAPSULE BY MOUTH TWICE A DAY FOR 8 DAYS FOR PROBIOTIC (DO NOT  CRUSH)  . sertraline (ZOLOFT) 100 MG tablet Take 1 tablet (100 mg total) by mouth daily.  . Sodium Phosphates (RA SALINE ENEMA RE) If not relieved by Biscodyl suppository, give disposable Saline Enema rectally X 1 dose/24 hrs as needed (Do not use constipation standing orders for residents with renal failure/CFR less than 30. Contact MD for orders)(Physician Or  . tiotropium (SPIRIVA HANDIHALER) 18 MCG inhalation capsule Place 1 capsule (18 mcg total) into inhaler and inhale daily.  . vitamin C (ASCORBIC ACID) 500 MG tablet Take 1 tablet (500 mg total) by mouth daily.  Marland Kitchen zinc sulfate 220 (50 Zn) MG capsule Take 220 mg by mouth daily. TAKE 1 CAPSULE BY MOUTH ONCE DAILY FOR 5 DAYS FOR COVID-19 (DO NOT CRUSH)  . [DISCONTINUED] bumetanide (BUMEX) 1 MG tablet Take 1 tablet (1 mg total) by mouth daily.   No facility-administered encounter medications on file as of 04/24/2019.    Review of Systems  GENERAL: No  Fever or chills  MOUTH and THROAT: Denies oral discomfort, gingival pain or bleeding RESPIRATORY: +SOB CARDIAC: No chest pain, edema or palpitations GI: No abdominal pain, diarrhea, constipation, heart burn, nausea or vomiting GU: Denies dysuria, frequency, hematuria, incontinence, or discharge NEUROLOGICAL: Denies dizziness, syncope, numbness, or headache PSYCHIATRIC: Denies feelings of depression or anxiety. No report of hallucinations, insomnia, paranoia, or agitation    Immunization History  Administered Date(s) Administered  . Influenza-Unspecified 12/14/2018   Pertinent  Health Maintenance Due  Topic Date Due  . PNA vac Low Risk Adult (1 of 2 - PCV13) 03/01/2020 (Originally 12/03/2005)  . INFLUENZA VACCINE  Completed   No flowsheet data found.   Vitals:   04/24/19 1538  BP: 102/62  Pulse: (!) 106  Resp: (!) 22  Temp: 97.9 F (36.6 C)  TempSrc: Oral  SpO2: 97%  Weight: 136 lb 9.6 oz (62 kg)  Height: 5\' 11"  (1.803 m)   Body mass index is 19.05 kg/m.  Physical  Exam  GENERAL APPEARANCE:  In no acute distress.  SKIN:  Skin is warm and dry.  MOUTH and THROAT: Lips are without lesions. Oral mucosa is moist and without lesions. Tongue is normal in shape, size, and color and without lesions RESPIRATORY: BS with rales, no wheezing CARDIAC: no murmur,no extra heart sounds, no edema GI: Abdomen soft, normal BS, no masses, no tenderness EXTREMITIES:  Left BKA NEUROLOGICAL: Speech is clear. PSYCHIATRIC:  Affect and behavior are appropriate  Labs reviewed: Recent Labs    02/20/19 0000  NA 139  139  K 4.9  CL 99  99  CO2 27*  27*  BUN 33*  23*  CREATININE 0.7  0.7  CALCIUM 9.1  9.1    Recent Labs    02/20/19 0000  WBC 9.8  9.8  HGB 12.3*  12.3*  HCT 37*  37*  PLT 560*  560*    Assessment/Plan  1. Acute respiratory failure with hypoxia (HCC) -has COPD and COVID-19, will continue O2 @ 3L/min via [place], incentive spirometry every hour while awake, Dexamethason and PRN albuterol  2. COVID-19 - will continue Azithromycin 250 mg BID X 5 more days and Florastor 250 mg 1 capsule BID X 8 days - continue ASA EC 81 mg, Zinc Sulfate, Guaiafenesin  3. Chronic obstructive pulmonary disease, unspecified COPD type (HCC) - continue O2, Dexamethasone, PRN albuterol and Spiriva     Family/ staff Communication:  Discussed plan of care with resident and charge nurse.  Labs/tests ordered:  None  Goals of care:   Long-term care   Kenard Gower, DNP, FNP-BC [facility] and Adult Medicine [phone] (Monday-Friday 8:00 a.m. - 5:00 p.m.) [phone] (after hours)

## 2019-04-24 NOTE — Telephone Encounter (Signed)
Received call from facility Nurse states patient's oxygen saturation 86 % on 3 L via [place] despite use of  Inhaler.HR 106 b/min.No shortness of breath reported.patient post COVID-19 infection.Orders given for Chest X-ray 2 views rule out acute abnormalities.Nurse Advised to check vital signs Q 4 Hrs X 24 Hrs then resume Q shift.Nurse to [facility] provider if symptoms worsen.

## 2019-04-26 ENCOUNTER — Encounter: Payer: Self-pay | Admitting: Internal Medicine

## 2019-04-26 ENCOUNTER — Non-Acute Institutional Stay (SKILLED_NURSING_FACILITY): Payer: Medicare Other | Admitting: Internal Medicine

## 2019-04-26 DIAGNOSIS — R000 Tachycardia, unspecified: Secondary | ICD-10-CM

## 2019-04-26 DIAGNOSIS — J449 Chronic obstructive pulmonary disease, unspecified: Secondary | ICD-10-CM

## 2019-04-26 DIAGNOSIS — J189 Pneumonia, unspecified organism: Secondary | ICD-10-CM

## 2019-04-26 DIAGNOSIS — G40909 Epilepsy, unspecified, not intractable, without status epilepticus: Secondary | ICD-10-CM

## 2019-04-26 DIAGNOSIS — F3340 Major depressive disorder, recurrent, in remission, unspecified: Secondary | ICD-10-CM

## 2019-04-26 DIAGNOSIS — I10 Essential (primary) hypertension: Secondary | ICD-10-CM

## 2019-04-26 NOTE — Assessment & Plan Note (Addendum)
Clinical picture suggests possibility of serotonin syndrome with tachycardia, tremor, and clonus.  Certainly sertraline 100 mg daily clinically seems to be excessive dose in view of his age and body habitus. Decrease sertraline to 50 mg daily and reassess.  Possibly wean totally depending on clinical assessment at follow-up

## 2019-04-26 NOTE — Patient Instructions (Signed)
See assessment and plan under each diagnosis in the problem list and acutely for this visit 

## 2019-04-26 NOTE — Assessment & Plan Note (Signed)
No seizure activity reported by staff 

## 2019-04-26 NOTE — Progress Notes (Signed)
   NURSING HOME LOCATION:  [facility] ROOM NUMBER:  128-A  CODE STATUS:  FULL CODE  PCP:  Pecola Lawless, MD  [address] [place] [address]   This is a nursing facility follow up of hypoxia, abnormal chest x-ray, and tachycardia.  Interim medical record and care since last [facility] visit was updated with review of diagnostic studies and change in clinical status since last visit were documented.  HPI: He exhibits profound hypoxia on room air with O2 sats of 77%.  This is easily reversible as O2 sats were 99% on 3 L.  He has had 2 courses of antibiotics and steroids for possible Covid HCAP complications.  Chest x-ray suggested bilateral lower lobe atelectasis vs infiltrate.  Imaging while he was hospitalized 11/22-11/30/2020 revealed a questionable right perihilar infiltrate and possible CAP.  He received a full course of antibiotics at that time. He exhibits a persistent tachycardia with heart rates varying from 95-112.  Vital signs today list his pulse as 68.  Blood pressure is low; he is not on any antihypertensive medications.  There is no TSH listed among his labs.  On 2/5 hemoglobin 11.8/hematocrit 35, levels which are stable compared to hospital records and which would not cause tachycardia.  White count was elevated at 15,500,but he has been on oral steroids as noted.  While hospitalized white count was 15,600 but normalized with a full course of antibiotics.  On 2/8 chemistries and electrolytes were essentially normal.  Creatinine was 0.6; this may reflect his relative cachexia and loss of muscle mass.  Review of systems:  He is on Depakote for seizure disorder.  No seizures have been reported at [facility]. He can provide no meaningful responses.  Verbalization was essentially indiscernible and unfocused.  At one point he pointed tremulously at the table commenting on the "chocolate cake there".  There was no chocolate cake on the bedside table.  His roommate validates  that "he will not talk".  Physical exam:  Pertinent or positive findings: He appears somewhat cachectic and chronically ill.  Hair is disheveled.  He is Transport planner.  Arcus senilis is noted.  He is edentulous.  Coarse rhonchi are noted in all lung fields obscuring the heart sounds.  There appears to be a pulse deficit which may be respiratory related which may explain the reported slower pulse. Left BKA is present.  Pedal pulses on the right are decreased.  The right toe appeared to be upgoing with stimulation of the foot.  Limbs are cachectic.  He is tremulous and exhibits some clonus.   Lymphatic: No lymphadenopathy about the head, neck, axilla. Eyes: No conjunctival inflammation or lid edema is present. There is no scleral icterus. Ears:  External ear exam shows no significant lesions or deformities.   Nose:  External nasal examination shows no deformity or inflammation. Nasal mucosa are pink and moist without lesions, exudates Neck:  No thyromegaly, masses, tenderness noted.    Lungs:  without wheezes,  rales, rubs. Abdomen: Bowel sounds are normal. Abdomen is soft and nontender with no organomegaly, hernias, masses. GU: Deferred  Extremities:  No cyanosis, clubbing, edema  Neurologic exam : Balance, Rhomberg, finger to nose testing could not be completed due to clinical state Skin: Warm & dry . No significant lesions or rash.  See summary under each active problem in the Problem List with associated updated therapeutic plan

## 2019-04-26 NOTE — Assessment & Plan Note (Addendum)
His dramatic hypoxia is easily corrected with supplemental oxygen.  Worrisome are chest x-ray changes suggesting atelectasis or possible infiltrates in the lower lobes.  He has received 2 courses of antibiotics and oral steroids.  Film be will be repeated. Decadron orally and IM Rocephin will be continued pending results of the chest x-ray.

## 2019-04-26 NOTE — Assessment & Plan Note (Addendum)
Hypoxia and coarse rhonchi bilaterally suggests high possibility of COVID-19 related HCAP.  Repeat the chest x-ray to reassess the lower lobe infiltrates previously seen.  Initiate Rocephin pending return of the x-ray.

## 2019-04-26 NOTE — Assessment & Plan Note (Addendum)
Blood pressure is actually low without antihypertensive medications according to current vital signs.

## 2019-04-26 NOTE — Assessment & Plan Note (Addendum)
Labs and med list do not suggest an etiology of the tachycardia.  Pulse was listed as 68 today; but I think this represents a pulse deficit related to respiratory variation.  Thyroid functions will be performed.  Sertraline will be weaned.

## 2019-04-27 LAB — TSH: TSH: 1.6 (ref 0.41–5.90)

## 2019-05-01 ENCOUNTER — Emergency Department (HOSPITAL_COMMUNITY): Payer: Medicare Other

## 2019-05-01 ENCOUNTER — Encounter (HOSPITAL_COMMUNITY): Payer: Self-pay

## 2019-05-01 ENCOUNTER — Other Ambulatory Visit: Payer: Self-pay

## 2019-05-01 ENCOUNTER — Emergency Department (HOSPITAL_COMMUNITY)
Admission: EM | Admit: 2019-05-01 | Discharge: 2019-05-01 | Disposition: A | Discharge status: Skilled Nursing Facility | Payer: Medicare Other | Source: Home / Self Care | Attending: Emergency Medicine | Admitting: Emergency Medicine

## 2019-05-01 ENCOUNTER — Non-Acute Institutional Stay (SKILLED_NURSING_FACILITY): Payer: Medicare Other | Admitting: Adult Health

## 2019-05-01 ENCOUNTER — Encounter: Payer: Self-pay | Admitting: Adult Health

## 2019-05-01 DIAGNOSIS — U071 COVID-19: Secondary | ICD-10-CM

## 2019-05-01 DIAGNOSIS — I2510 Atherosclerotic heart disease of native coronary artery without angina pectoris: Secondary | ICD-10-CM | POA: Insufficient documentation

## 2019-05-01 DIAGNOSIS — J1282 Pneumonia due to coronavirus disease 2019: Secondary | ICD-10-CM | POA: Diagnosis not present

## 2019-05-01 DIAGNOSIS — A419 Sepsis, unspecified organism: Secondary | ICD-10-CM | POA: Diagnosis not present

## 2019-05-01 DIAGNOSIS — J449 Chronic obstructive pulmonary disease, unspecified: Secondary | ICD-10-CM | POA: Insufficient documentation

## 2019-05-01 DIAGNOSIS — R042 Hemoptysis: Secondary | ICD-10-CM

## 2019-05-01 DIAGNOSIS — Z79899 Other long term (current) drug therapy: Secondary | ICD-10-CM | POA: Insufficient documentation

## 2019-05-01 DIAGNOSIS — Z7982 Long term (current) use of aspirin: Secondary | ICD-10-CM | POA: Insufficient documentation

## 2019-05-01 DIAGNOSIS — I10 Essential (primary) hypertension: Secondary | ICD-10-CM | POA: Insufficient documentation

## 2019-05-01 DIAGNOSIS — Z87891 Personal history of nicotine dependence: Secondary | ICD-10-CM | POA: Insufficient documentation

## 2019-05-01 DIAGNOSIS — A4102 Sepsis due to Methicillin resistant Staphylococcus aureus: Secondary | ICD-10-CM | POA: Diagnosis not present

## 2019-05-01 LAB — COMPREHENSIVE METABOLIC PANEL
ALT: 14 U/L (ref 0–44)
AST: 15 U/L (ref 15–41)
Albumin: 2.6 g/dL — ABNORMAL LOW (ref 3.5–5.0)
Alkaline Phosphatase: 56 U/L (ref 38–126)
Anion gap: 9 (ref 5–15)
BUN: 26 mg/dL — ABNORMAL HIGH (ref 8–23)
CO2: 26 mmol/L (ref 22–32)
Calcium: 8.3 — AB (ref 8.7–10.7)
Calcium: 8.8 mg/dL — ABNORMAL LOW (ref 8.9–10.3)
Chloride: 106 mmol/L (ref 98–111)
Creatinine, Ser: 0.55 mg/dL — ABNORMAL LOW (ref 0.61–1.24)
GFR calc Af Amer: 90
GFR calc Af Amer: >60 mL/min (ref >60)
GFR calc non Af Amer: 90
GFR calc non Af Amer: >60 mL/min (ref >60)
Glucose, Bld: 114 mg/dL — ABNORMAL HIGH (ref 70–99)
Potassium: 4.8 mmol/L (ref 3.5–5.1)
Sodium: 141 mmol/L (ref 135–145)
Total Bilirubin: 0.6 mg/dL (ref 0.3–1.2)
Total Protein: 7.4 g/dL (ref 6.5–8.1)

## 2019-05-01 LAB — BASIC METABOLIC PANEL
BUN: 22 — AB (ref 4–21)
CO2: 27 — AB (ref 13–22)
Chloride: 105 (ref 99–108)
Creatinine: 0.5 — AB (ref 0.6–1.3)
Glucose: 111
Potassium: 4.4 (ref 3.4–5.3)
Sodium: 140 (ref 137–147)

## 2019-05-01 LAB — CBC AND DIFFERENTIAL
HCT: 31 — AB (ref 41–53)
Hemoglobin: 10.3 — AB (ref 13.5–17.5)
Neutrophils Absolute: 6
Platelets: 335 (ref 150–399)
WBC: 9.1

## 2019-05-01 LAB — CBC WITH DIFFERENTIAL/PLATELET
Abs Immature Granulocytes: 0.03 10*3/uL (ref 0.00–0.07)
Basophils Absolute: 0 10*3/uL (ref 0.0–0.1)
Basophils Relative: 0 %
Eosinophils Absolute: 0 10*3/uL (ref 0.0–0.5)
Eosinophils Relative: 0 %
HCT: 36.8 % — ABNORMAL LOW (ref 39.0–52.0)
Hemoglobin: 11.5 g/dL — ABNORMAL LOW (ref 13.0–17.0)
Immature Granulocytes: 0 %
Lymphocytes Relative: 11 %
Lymphs Abs: 0.8 10*3/uL (ref 0.7–4.0)
MCH: 29.1 pg (ref 26.0–34.0)
MCHC: 31.3 g/dL (ref 30.0–36.0)
MCV: 93.2 fL (ref 80.0–100.0)
Monocytes Absolute: 0.2 10*3/uL (ref 0.1–1.0)
Monocytes Relative: 2 %
Neutro Abs: 6.4 10*3/uL (ref 1.7–7.7)
Neutrophils Relative %: 87 %
Platelets: 376 10*3/uL (ref 150–400)
RBC: 3.95 MIL/uL — ABNORMAL LOW (ref 4.22–5.81)
RDW: 14.2 % (ref 11.5–15.5)
WBC: 7.4 10*3/uL (ref 4.0–10.5)
nRBC: 0 % (ref 0.0–0.2)

## 2019-05-01 LAB — APTT: aPTT: 30 seconds (ref 24–36)

## 2019-05-01 LAB — CBC: RBC: 3.39 — AB (ref 3.87–5.11)

## 2019-05-01 LAB — PROTIME-INR
INR: 1.2 (ref 0.8–1.2)
Prothrombin Time: 14.9 seconds (ref 11.4–15.2)

## 2019-05-01 MED ORDER — IOHEXOL 350 MG/ML SOLN
100.0000 mL | Freq: Once | INTRAVENOUS | Status: AC | PRN
  Administered 2019-05-01: 19:00:00 100 mL via INTRAVENOUS

## 2019-05-01 MED ORDER — SODIUM CHLORIDE (PF) 0.9 % IJ SOLN
INTRAMUSCULAR | Status: AC
  Filled 2019-05-01: qty 50

## 2019-05-01 NOTE — ED Provider Notes (Addendum)
[facility] Note   CSN: [id number] Arrival date & time: 05/01/19  1539     History Chief Complaint  Patient presents with  . COVID+  . Hematemesis    Paul Suarez is a 79 y.o. male presenting for evaluation of hemoptysis.  Patient states he was positive for Covid earlier this month.  Since then, he has needed more oxygen than his baseline, used to be on 2 L is now on 4.  2 days ago he started to cough up blood.  Patient states he has noticed blood coming from his mouth and his nose.  He denies history of epistaxis in the past.  He states he has been told he has thin blood, but is not sure if he is on any blood thinners.  He denies fevers, chills, chest pain, nausea, vomiting, abdominal pain.   Additional history obtained from chart review.  History of COPD, hypertension, CAD.  Reviewed notes from nursing home, patient tested Covid +2 weeks ago, had increased oxygen.  Treated with azithromycin and Rocephin.  States there was bright red mucus on his linen this morning.  He is on Plavix and aspirin daily.  HPI     Past Medical History:  Diagnosis Date  . CAD (coronary artery disease)   . COPD (chronic obstructive pulmonary disease) (HCC)   . Depression   . Essential hypertension   . Seizure disorder [facility])     Patient Active Problem List   Diagnosis Date Noted  . Tachycardia 04/26/2019  . AMS (altered mental status) 02/13/2019  . UTI due to extended-spectrum beta lactamase (ESBL) producing Escherichia coli 02/12/2019  . Pneumonia 02/12/2019  . CAD (coronary artery disease) 02/12/2019  . Essential hypertension 02/12/2019  . Vitamin D deficiency 02/12/2019  . Seasonal allergies 02/12/2019  . Muscle spasm/History of left BKA 02/12/2019  . Insomnia 02/12/2019  . COPD (chronic obstructive pulmonary disease) (HCC) 02/12/2019  . Major depression, recurrent (HCC) 02/12/2019  . Hypokalemia 02/12/2019  . Constipation 02/12/2019  . Seizure  disorder (HCC) 02/12/2019    Past Surgical History:  Procedure Laterality Date  . BELOW KNEE LEG AMPUTATION Left        History reviewed. No pertinent family history.  Social History   Tobacco Use  . Smoking status: Former Smoker    Years: 20.00  . Smokeless tobacco: Never Used  Substance Use Topics  . Alcohol use: Not Currently  . Drug use: Never    Home Medications Prior to Admission medications   Medication Sig Start Date End Date Taking? Authorizing Provider  albuterol (VENTOLIN HFA) 108 (90 Base) MCG/ACT inhaler Inhale 2 puffs into the lungs every 6 (six) hours as needed for shortness of breath (FOR Shortness of Breath  (S.O.B.)).   Yes [provider]  aspirin EC 81 MG tablet Take 81 mg by mouth daily. 04/24/19 05/03/19 Yes [provider]  cholecalciferol (VITAMIN D) 25 MCG (1000 UT) tablet Take 1 tablet (1,000 Units total) by mouth daily. 02/12/19  Yes Medina-Vargas, Monina C, NP  clopidogrel (PLAVIX) 75 MG tablet Take 1 tablet (75 mg total) by mouth daily. 02/12/19  Yes Medina-Vargas, Monina C, NP  dexamethasone (DECADRON) 2 MG tablet Take 6 mg by mouth daily. (d/c prednisone when Dexamethasone is available)   Yes [provider]  divalproex (DEPAKOTE ER) 500 MG 24 hr tablet Take 1 tablet (500 mg total) by mouth at bedtime. 02/12/19  Yes Medina-Vargas, Monina C, NP  docusate sodium (COLACE) 100 MG capsule  Take 1 capsule (100 mg total) by mouth at bedtime. 02/12/19  Yes Medina-Vargas, Monina C, NP  ipratropium-albuterol (DUONEB) 0.5-2.5 (3) MG/3ML SOLN Take 3 mLs by nebulization 4 (four) times daily. For SOB and Wheezing 04/28/19 05/01/19 Yes [provider]  loratadine (CLARITIN) 10 MG tablet Take 1 tablet (10 mg total) by mouth daily. 02/12/19  Yes Medina-Vargas, Monina C, NP  magnesium hydroxide (MILK OF MAGNESIA) 400 MG/5ML suspension If no BM in 3 days, give 30 cc Milk of Magnesium p.o. x 1 dose in 24 hours as needed (Do not use standing  constipation orders for residents with renal failure CFR less than 30. Contact MD for orders) (Physician Order)   Yes [provider]  Melatonin 3 MG TABS Take 3 mg by mouth at bedtime as needed (sleep).    Yes [provider]  Nutritional Supplement LIQD Take 120 mLs by mouth 2 (two) times daily. MedPass   Yes [provider]  Nutritional Supplements (NUTRITIONAL SUPPLEMENT PO) Take 1 each by mouth daily. Magic Cup   Yes [provider]  polyethylene glycol powder (GLYCOLAX/MIRALAX) 17 GM/SCOOP powder Take 17 g by mouth daily. 02/12/19  Yes Medina-Vargas, Monina C, NP  Saccharomyces boulardii (PROBIOTIC) 250 MG CAPS TAKE 1 CAPSULE BY MOUTH TWICE A DAY FOR 8 DAYS (DO NOT CRUSH) 04/25/19 05/02/19 Yes [provider]  sertraline (ZOLOFT) 50 MG tablet Take 50 mg by mouth daily. 04/27/19 05/02/19 Yes [provider]  tiotropium (SPIRIVA HANDIHALER) 18 MCG inhalation capsule Place 1 capsule (18 mcg total) into inhaler and inhale daily. 02/12/19  Yes Medina-Vargas, Monina C, NP  vitamin C (ASCORBIC ACID) 500 MG tablet Take 1 tablet (500 mg total) by mouth daily. 02/12/19  Yes Medina-Vargas, Monina C, NP  zinc sulfate 220 (50 Zn) MG capsule Take 220 mg by mouth daily. TAKE 1 CAPSULE BY MOUTH ONCE DAILY FOR 5 DAYS FOR COVID-19 (DO NOT CRUSH) 04/19/19  Yes [provider]  OXYGEN Inhale 2 L into the lungs. O2 at 2L/min via [place] continuously (portable & stationary oncentrators)    [provider]  potassium chloride SA (KLOR-CON) 20 MEQ tablet Take 1 tablet (20 mEq total) by mouth daily. Patient not taking: Reported on 05/01/2019 02/12/19   Medina-Vargas, Senaida Lange, NP    Allergies    Patient has no known allergies.  Review of Systems   Review of Systems  Respiratory: Positive for cough.        Hemoptysis  Hematological: Bruises/bleeds easily.  All other systems reviewed and are negative.   Physical Exam Updated Vital Signs BP 101/87    Pulse 97   Temp 97.9 F (36.6 C) (Oral)   Resp (!) 24   SpO2 90%   Physical Exam Vitals and nursing note reviewed.  Constitutional:      General: He is not in acute distress.    Appearance: He is well-developed.     Comments: Elderly male who appears nontoxic  HENT:     Head: Normocephalic and atraumatic.     Nose:     Comments: Dried blood in bilateral nares. No active bleeding    Mouth/Throat:     Comments: Dried blood around the mouth and along the roof of the mouth.  No active bleeding. Eyes:     Extraocular Movements: Extraocular movements intact.     Conjunctiva/sclera: Conjunctivae normal.     Pupils: Pupils are equal, round, and reactive to light.  Cardiovascular:     Rate and Rhythm: Regular  rhythm. Tachycardia present.     Pulses: Normal pulses.     Comments: Mildly tachycardic around 105 Pulmonary:     Effort: Pulmonary effort is normal. No respiratory distress.     Breath sounds: Normal breath sounds. No wheezing.     Comments: Speaking in short sentences.  Sats stable on 4 L via nasal cannula.  No respiratory distress Abdominal:     General: There is no distension.     Palpations: Abdomen is soft. There is no mass.     Tenderness: There is no abdominal tenderness. There is no guarding or rebound.  Musculoskeletal:        General: Normal range of motion.     Cervical back: Normal range of motion and neck supple.     Comments: Left BKA.  Right pedal pulse 2+.  No lower extremity pain or swelling.  Skin:    General: Skin is warm and dry.     Capillary Refill: Capillary refill takes less than 2 seconds.  Neurological:     Mental Status: He is alert and oriented to person, place, and time.     ED Results / Procedures / Treatments   Labs (all labs ordered are listed, but only abnormal results are displayed) Labs Reviewed  CBC WITH DIFFERENTIAL/PLATELET - Abnormal; Notable for the following components:      Result Value   RBC 3.95 (*)    Hemoglobin 11.5 (*)     HCT 36.8 (*)    All other components within normal limits  COMPREHENSIVE METABOLIC PANEL - Abnormal; Notable for the following components:   Glucose, Bld 114 (*)    BUN 26 (*)    Creatinine, Ser 0.55 (*)    Calcium 8.8 (*)    Albumin 2.6 (*)    All other components within normal limits  APTT  PROTIME-INR    EKG  EKG Interpretation  Date/Time:      05/01/19     17:08:36   Ventricular Rate:  110   PR Interval:    QRS Duration:     88   QT Interval:      325   QTC Calculation: 440   R Axis:     Text Interpretation:  Sinus tachycardia. No stemi          Radiology CT Angio Chest PE W and/or Wo Contrast  Result Date: 05/01/2019 CLINICAL DATA:  Hemoptysis. EXAM: CT ANGIOGRAPHY CHEST WITH CONTRAST TECHNIQUE: Multidetector CT imaging of the chest was performed using the standard protocol during bolus administration of intravenous contrast. Multiplanar CT image reconstructions and MIPs were obtained to evaluate the vascular anatomy. CONTRAST:  OMNIPAQUE IOHEXOL 350 MG/ML SOLN COMPARISON:  Chest x-ray earlier same day FINDINGS: Cardiovascular: The heart size is normal. No substantial pericardial effusion. Coronary artery calcification is evident. Atherosclerotic calcification is noted in the wall of the thoracic aorta. No large central pulmonary embolus. No evidence for upper lobe pulmonary embolic disease. Assessment of lower lobes is markedly degraded by breathing motion making assessment of lower lobe segmental and subsegmental pulmonary arteries unreliable. Mediastinum/Nodes: 13 mm short axis right hilar node is associated with a 18 mm short axis subcarinal lymph node. No left hilar lymphadenopathy. Moderate to large hiatal hernia. There is no axillary lymphadenopathy. Lungs/Pleura: Centrilobular and paraseptal emphysema evident. Pleural scarring noted anterior right hemithorax at the base potentially from prior empyema or hemothorax. Patchy and areas of confluent airspace disease  noted posterior right upper lobe, posterior right lower lobe, and posterior  left lower lobe. Airspace disease has a peripheral predominance. No pleural effusion. Biapical pleuroparenchymal scarring noted. Upper Abdomen: Small hypoattenuating lesions in the liver cannot be definitively characterized but approach water density and are likely cysts. Probable cysts noted posterior interpolar right kidney and upper pole left kidney Musculoskeletal: No worrisome lytic or sclerotic osseous abnormality. Bones are diffusely demineralized. Compression fractures are noted at T11, T12, L1 and T6. Review of the MIP images confirms the above findings. IMPRESSION: 1. No large central pulmonary embolus or evidence for pulmonary embolus to either upper lobe. Assessment of segmental and subsegmental pulmonary arteries to the lower lobes is unreliable due to respiratory breathing motion throughout image acquisition. 2. Fairly marked emphysema with patchy bilateral airspace disease, right greater than left. Airspace opacity is confluent in some regions of the posterior right lower lobe. Multifocal pneumonia is a primary consideration. Distribution is not central as typically seen in the setting of pulmonary hemorrhage. Close follow-up recommended to ensure resolution. 3. Mediastinal and right hilar lymphadenopathy, potentially reactive. Attention on follow-up recommended to ensure resolution. 4.  Aortic Atherosclerois (ICD10-170.0) 5. Osteopenia with multiple thoracolumbar compression fractures. Electronically Signed   By: Kennith Center M.D.   On: 05/01/2019 19:26   DG Chest Portable 1 View  Result Date: 05/01/2019 CLINICAL DATA:  Possible hemoptysis, COVID positive EXAM: PORTABLE CHEST 1 VIEW COMPARISON:  None. FINDINGS: There is increased density in the mid right lung and left greater than right lung bases. No pleural effusion or pneumothorax. Heart size is normal. IMPRESSION: Bilateral pulmonary opacities, which may reflect  atypical pneumonia. Component of hemorrhage is also possible. Electronically Signed   By: Guadlupe Spanish M.D.   On: 05/01/2019 16:52    Procedures Procedures (including critical care time)  Medications Ordered in ED Medications  sodium chloride (PF) 0.9 % injection (has no administration in time range)  iohexol (OMNIPAQUE) 350 MG/ML injection 100 mL (100 mLs Intravenous Contrast Given 05/01/19 1851)    ED Course  I have reviewed the triage vital signs and the nursing notes.  Pertinent labs & imaging results that were available during my care of the patient were reviewed by me and considered in my medical decision making (see chart for details).  MDM Rules/Calculators/A&P                      Patient presenting for evaluation of hemoptysis.  Physical exam shows chronically ill-appearing male.  He does have dried blood in his nares and mouth, consider epistaxis/hematemesis versus hemoptysis.  Consider PE in the setting of recent Covid diagnosis.  Consider hemoptysis in the setting of Covid pneumonia.  Unable to ascertain how much blood was lost.  Will obtain labs, x-ray, and EKG.  Labs show stable hemoglobin.  Platelets are normal.  Chest x-ray viewed interpreted by me, shows bilateral pulmonary opacities.  Per radiology, may be atypical pneumonia with component of hemorrhage possible. Will order CTA for further evaluation. Case discussed with attending, Dr. Rhunette Croft evaluated the pt.  CTA negative for PE.  Does show multifocal pneumonia, exam is not consistent with hemorrhage.  Patient was treated with antibiotics at the facility, recently diagnosed with Covid, this is likely the reason for the pneumonia.  Will not treat with further antibiotics as he is afebrile and without worsening shortness of breath.  No further hemoptysis or bleeding episodes in the ED.  Patient states he feels well.  Discussed findings with patient and daughter.  Discussed plan for discharge with close monitoring and  follow-up.  At this time, patient appears safe for discharge.  Return precautions given.  Patient states he understands and agrees to plan.  Final Clinical Impression(s) / ED Diagnoses Final diagnoses:  Hemoptysis  COVID-19    Rx / DC Orders ED Discharge Orders    None       Alveria Apley, PA-C 05/01/19 2032    Derwood Kaplan, MD 05/02/19 1727    [address], Towanna Avery, PA-C 05/15/19 Billee Cashing, MD 05/15/19 [phone]

## 2019-05-01 NOTE — ED Triage Notes (Signed)
Pt BIB EMS from [place]. Facility reports patient coughing up blood starting yesterday (bright red), patient was positive for COVID 2/2. Patient also had pneumonia at same time. Patient AXOx4. Pt on 4L currently [place], patient has been on 4L since being positive for COVID. Pt previously on 2L before. Patient did not have hematemesis with EMS. Pt denies pain.   91% 4L 

## 2019-05-01 NOTE — ED Notes (Signed)
Paul Suarez, daughter, [phone], would like an update on her father.

## 2019-05-01 NOTE — Discharge Instructions (Addendum)
Hold your aspirin today and tomorrow.  Monitor closely for worsening symptoms.  If you have worsening hemoptysis, return to the emergency room. Follow-up with your primary care doctor for recheck in 2 days. Return to the emergency room if any new, worsening, or concerning symptoms.

## 2019-05-01 NOTE — ED Notes (Signed)
[facility] called  

## 2019-05-01 NOTE — Progress Notes (Signed)
Location:  [facility] Room Number: 128/A Place of Service:  SNF (31) Provider:  Kenard Gower, DNP, FNP-BC  Patient Care Team: Pecola Lawless, MD as PCP - General (Internal Medicine)  Extended Emergency Contact Information Primary Emergency Contact: [place], [place] Mobile Phone: [phone] Relation: Daughter  Code Status:  Full Code  Goals of care: Advanced Directive information Advanced Directives 05/01/2019  Does Patient Have a Medical Advance Directive? Yes  Type of Advance Directive (No Data)  Does patient want to make changes to medical advance directive? No - Patient declined     Chief Complaint  Patient presents with  . Acute Visit    congestion with non-productive cough, O2 sat 86% on 3L/min oxygen    HPI:  Pt is a 79 y.o. male seen today for continued coughing with O2 sat of 86% on O2 on 3L/min. He recently tested positive for COVID-19 Ag and was treated with 2 rounds of Azithromycin and then Rocephin for 5 days for pneumonia. In the morning, there was a bright red mucus on his linen noted. He was not able to tell where it came from. He requested for an ensure and was able to drink 237 ml without coughing. Stat CBC and BMP was ordered.  During lunch, he was reported to have coughed out blood. O2 sat was 88-89% on 3L O2. He denies having any pain. He is currently on Plavix and ASA EC 81 mg daily. He has PMH of hypertension, seizure disorder, history of left BKA, resting tremor, CAD and COPD.  Past Medical History:  Diagnosis Date  . CAD (coronary artery disease)   . COPD (chronic obstructive pulmonary disease) (HCC)   . Depression   . Essential hypertension   . Seizure disorder [facility])    Past Surgical History:  Procedure Laterality Date  . BELOW KNEE LEG AMPUTATION Left     No Known Allergies  Outpatient Encounter Medications as of 05/01/2019  Medication Sig  . albuterol (VENTOLIN HFA) 108 (90 Base) MCG/ACT inhaler Inhale 2  puffs into the lungs every 6 (six) hours as needed for shortness of breath (FOR Shortness of Breath  (S.O.B.)).  Marland Kitchen aspirin EC 81 MG tablet Take 81 mg by mouth daily.  . bisacodyl (DULCOLAX) 10 MG suppository If not relieved by MOM, give 10 mg Bisacodyl suppositiory rectally X 1 dose in 24 hours as needed (Do not use constipation standing orders for residents with renal failure/CFR less than 30. Contact MD for orders) (Physician Order)  . cefTRIAXone (ROCEPHIN) IVPB Inject 1 g into the vein daily.  . cholecalciferol (VITAMIN D) 25 MCG (1000 UT) tablet Take 1 tablet (1,000 Units total) by mouth daily.  . clopidogrel (PLAVIX) 75 MG tablet Take 1 tablet (75 mg total) by mouth daily.  Marland Kitchen dexamethasone (DECADRON) 2 MG tablet Take 6 mg by mouth daily. (d/c prednisone when Dexamethasone is available)  . divalproex (DEPAKOTE ER) 500 MG 24 hr tablet Take 1 tablet (500 mg total) by mouth at bedtime.  . docusate sodium (COLACE) 100 MG capsule Take 1 capsule (100 mg total) by mouth at bedtime.  . feeding supplement, ENSURE ENLIVE, (ENSURE ENLIVE) LIQD Take 237 mLs by mouth 2 (two) times daily between meals. To promote weight gain  . ipratropium-albuterol (DUONEB) 0.5-2.5 (3) MG/3ML SOLN Take 3 mLs by nebulization 4 (four) times daily. For SOB and Wheezing  . loratadine (CLARITIN) 10 MG tablet Take 1 tablet (10 mg total) by mouth daily.  . magnesium hydroxide (MILK OF  MAGNESIA) 400 MG/5ML suspension If no BM in 3 days, give 30 cc Milk of Magnesium p.o. x 1 dose in 24 hours as needed (Do not use standing constipation orders for residents with renal failure CFR less than 30. Contact MD for orders) (Physician Order)  . Melatonin 3 MG TABS Take 3 mg by mouth at bedtime.  . Nutritional Supplement LIQD Take 120 mLs by mouth 2 (two) times daily. MedPass  . Nutritional Supplements (NUTRITIONAL SUPPLEMENT PO) Take 1 each by mouth daily. Magic Cup  . OXYGEN Inhale 2 L into the lungs. O2 at 2L/min via [place] continuously  (portable & stationary oncentrators)  . polyethylene glycol powder (GLYCOLAX/MIRALAX) 17 GM/SCOOP powder Take 17 g by mouth daily.  . potassium chloride SA (KLOR-CON) 20 MEQ tablet Take 1 tablet (20 mEq total) by mouth daily.  . Saccharomyces boulardii (PROBIOTIC) 250 MG CAPS TAKE 1 CAPSULE BY MOUTH TWICE A DAY FOR 8 DAYS (DO NOT CRUSH)  . [START ON 05/02/2019] sertraline (ZOLOFT) 25 MG tablet Take 25 mg by mouth daily.  . sertraline (ZOLOFT) 50 MG tablet Take 50 mg by mouth daily.  . Sodium Phosphates (RA SALINE ENEMA RE) If not relieved by Biscodyl suppository, give disposable Saline Enema rectally X 1 dose/24 hrs as needed (Do not use constipation standing orders for residents with renal failure/CFR less than 30. Contact MD for orders)(Physician Or  . tiotropium (SPIRIVA HANDIHALER) 18 MCG inhalation capsule Place 1 capsule (18 mcg total) into inhaler and inhale daily.  . vitamin C (ASCORBIC ACID) 500 MG tablet Take 1 tablet (500 mg total) by mouth daily.  Marland Kitchen zinc sulfate 220 (50 Zn) MG capsule Take 220 mg by mouth daily. TAKE 1 CAPSULE BY MOUTH ONCE DAILY FOR 5 DAYS FOR COVID-19 (DO NOT CRUSH)  . [DISCONTINUED] sertraline (ZOLOFT) 100 MG tablet Take 1 tablet (100 mg total) by mouth daily. (Patient taking differently: No sig reported)   No facility-administered encounter medications on file as of 05/01/2019.    Review of Systems  GENERAL: +poor appetite, no fever, chills or weakness MOUTH and THROAT: Denies oral discomfort, gingival pain or bleeding RESPIRATORY: +cough and SOB CARDIAC: No chest pain, edema or palpitations GI: No abdominal pain, diarrhea, constipation, heart burn, nausea or vomiting GU: Denies dysuria, frequency, hematuria or discharge NEUROLOGICAL: Denies dizziness, syncope, numbness, or headache PSYCHIATRIC: Denies feelings of depression or anxiety. No report of hallucinations, insomnia, paranoia, or agitation   Immunization History  Administered Date(s) Administered  .  Influenza-Unspecified 12/14/2018   Pertinent  Health Maintenance Due  Topic Date Due  . PNA vac Low Risk Adult (1 of 2 - PCV13) 03/01/2020 (Originally 12/03/2005)  . INFLUENZA VACCINE  Completed   No flowsheet data found.   Vitals:   05/01/19 1113  BP: (!) 95/56  Pulse: 91  Resp: 20  Temp: 98.1 F (36.7 C)  TempSrc: Oral  SpO2: 91%  Weight: 132 lb 12.8 oz (60.2 kg)  Height: 5\' 11"  (1.803 m)   Body mass index is 18.52 kg/m.  Physical Exam  GENERAL APPEARANCE:  Cachectic  MOUTH and THROAT: Lips are without lesions. Oral mucosa has dry blood NECK: supple, trachea midline, no neck masses, no thyroid tenderness, no thyromegaly RESPIRATORY: + SOB with O2 @ 3L/min via [place], no wheezing CARDIAC: RRR, no murmur,no extra heart sounds, no edema GI: Abdomen soft, normal BS, no masses, no tenderness NEUROLOGICAL:  +tremor. Speech is clear. Alert to self, disoriented to time and place. PSYCHIATRIC:  Affect and behavior are appropriate  Labs reviewed: Recent Labs    04/16/19 0000 04/19/19 0000 04/23/19 0000  NA 133* 138 138  K 4.9 4.5 4.9  CL 93* 99 100  CO2 27* 24* 27*  BUN 29* 24* 19  CREATININE 0.7 0.6 0.6  CALCIUM 9.0 8.6* 8.7    Recent Labs    02/20/19 0000 04/16/19 0000 04/20/19 0000  WBC 9.8  9.8 12.1 15.5  HGB 12.3*  12.3* 12.7* 11.8*  HCT 37*  37* 37* 35*  PLT 560*  560* 417* 603*     Assessment/Plan  1. Hemoptysis - moderate amount of bright red blood from mouth, this is new and has significant amount, will need to be transferred to ED for further evaluation and management  2. Pneumonia due to COVID-19 virus - completed 10 days of Azithromycin and 5 days of Rocephin - no reported fever - chest x-ray on 04/24/19 showed bilateral infrahilar airspace opacification  Family/ staff Communication: Discussed plan of care with resident and charge nurse.  Labs/tests ordered:  BMP and CBC ordered  Goals of care:   Long-term care   Kenard Gower,  DNP, FNP-BC [facility] [phone] (Monday-Friday 8:00 a.m. - 5:00 p.m.) [phone] (after hours)

## 2019-05-02 ENCOUNTER — Emergency Department (HOSPITAL_COMMUNITY): Payer: Medicare Other

## 2019-05-02 ENCOUNTER — Non-Acute Institutional Stay (SKILLED_NURSING_FACILITY): Payer: Medicare Other | Admitting: Adult Health

## 2019-05-02 ENCOUNTER — Inpatient Hospital Stay (HOSPITAL_COMMUNITY)
Admission: EM | Admit: 2019-05-02 | Discharge: 2019-05-11 | DRG: 871 | Disposition: A | Discharge status: Skilled Nursing Facility | Payer: Medicare Other | Source: Skilled Nursing Facility | Attending: Internal Medicine | Admitting: Internal Medicine

## 2019-05-02 DIAGNOSIS — I10 Essential (primary) hypertension: Secondary | ICD-10-CM | POA: Diagnosis present

## 2019-05-02 DIAGNOSIS — R040 Epistaxis: Secondary | ICD-10-CM

## 2019-05-02 DIAGNOSIS — A419 Sepsis, unspecified organism: Secondary | ICD-10-CM | POA: Diagnosis present

## 2019-05-02 DIAGNOSIS — Y95 Nosocomial condition: Secondary | ICD-10-CM | POA: Diagnosis present

## 2019-05-02 DIAGNOSIS — R7881 Bacteremia: Secondary | ICD-10-CM | POA: Diagnosis not present

## 2019-05-02 DIAGNOSIS — Z66 Do not resuscitate: Secondary | ICD-10-CM | POA: Diagnosis not present

## 2019-05-02 DIAGNOSIS — Z79899 Other long term (current) drug therapy: Secondary | ICD-10-CM

## 2019-05-02 DIAGNOSIS — N390 Urinary tract infection, site not specified: Secondary | ICD-10-CM | POA: Diagnosis present

## 2019-05-02 DIAGNOSIS — E611 Iron deficiency: Secondary | ICD-10-CM | POA: Diagnosis present

## 2019-05-02 DIAGNOSIS — Z7982 Long term (current) use of aspirin: Secondary | ICD-10-CM

## 2019-05-02 DIAGNOSIS — J9621 Acute and chronic respiratory failure with hypoxia: Secondary | ICD-10-CM | POA: Diagnosis present

## 2019-05-02 DIAGNOSIS — L89152 Pressure ulcer of sacral region, stage 2: Secondary | ICD-10-CM | POA: Diagnosis not present

## 2019-05-02 DIAGNOSIS — R443 Hallucinations, unspecified: Secondary | ICD-10-CM | POA: Diagnosis present

## 2019-05-02 DIAGNOSIS — Z89512 Acquired absence of left leg below knee: Secondary | ICD-10-CM | POA: Diagnosis not present

## 2019-05-02 DIAGNOSIS — Z7952 Long term (current) use of systemic steroids: Secondary | ICD-10-CM

## 2019-05-02 DIAGNOSIS — J439 Emphysema, unspecified: Secondary | ICD-10-CM | POA: Diagnosis present

## 2019-05-02 DIAGNOSIS — R4789 Other speech disturbances: Secondary | ICD-10-CM | POA: Diagnosis not present

## 2019-05-02 DIAGNOSIS — D62 Acute posthemorrhagic anemia: Secondary | ICD-10-CM | POA: Diagnosis present

## 2019-05-02 DIAGNOSIS — J15212 Pneumonia due to Methicillin resistant Staphylococcus aureus: Secondary | ICD-10-CM | POA: Diagnosis present

## 2019-05-02 DIAGNOSIS — Z7902 Long term (current) use of antithrombotics/antiplatelets: Secondary | ICD-10-CM

## 2019-05-02 DIAGNOSIS — B948 Sequelae of other specified infectious and parasitic diseases: Secondary | ICD-10-CM | POA: Diagnosis not present

## 2019-05-02 DIAGNOSIS — E872 Acidosis: Secondary | ICD-10-CM | POA: Diagnosis present

## 2019-05-02 DIAGNOSIS — J432 Centrilobular emphysema: Secondary | ICD-10-CM | POA: Diagnosis not present

## 2019-05-02 DIAGNOSIS — B9620 Unspecified Escherichia coli [E. coli] as the cause of diseases classified elsewhere: Secondary | ICD-10-CM | POA: Diagnosis present

## 2019-05-02 DIAGNOSIS — I2510 Atherosclerotic heart disease of native coronary artery without angina pectoris: Secondary | ICD-10-CM | POA: Diagnosis present

## 2019-05-02 DIAGNOSIS — Z681 Body mass index (BMI) 19 or less, adult: Secondary | ICD-10-CM

## 2019-05-02 DIAGNOSIS — B9561 Methicillin susceptible Staphylococcus aureus infection as the cause of diseases classified elsewhere: Secondary | ICD-10-CM

## 2019-05-02 DIAGNOSIS — G40909 Epilepsy, unspecified, not intractable, without status epilepticus: Secondary | ICD-10-CM | POA: Diagnosis present

## 2019-05-02 DIAGNOSIS — I739 Peripheral vascular disease, unspecified: Secondary | ICD-10-CM | POA: Diagnosis present

## 2019-05-02 DIAGNOSIS — R6520 Severe sepsis without septic shock: Secondary | ICD-10-CM | POA: Diagnosis present

## 2019-05-02 DIAGNOSIS — L8990 Pressure ulcer of unspecified site, unspecified stage: Secondary | ICD-10-CM | POA: Insufficient documentation

## 2019-05-02 DIAGNOSIS — G9340 Encephalopathy, unspecified: Secondary | ICD-10-CM | POA: Diagnosis not present

## 2019-05-02 DIAGNOSIS — J441 Chronic obstructive pulmonary disease with (acute) exacerbation: Secondary | ICD-10-CM | POA: Diagnosis present

## 2019-05-02 DIAGNOSIS — R410 Disorientation, unspecified: Secondary | ICD-10-CM | POA: Diagnosis not present

## 2019-05-02 DIAGNOSIS — I2584 Coronary atherosclerosis due to calcified coronary lesion: Secondary | ICD-10-CM | POA: Diagnosis not present

## 2019-05-02 DIAGNOSIS — F339 Major depressive disorder, recurrent, unspecified: Secondary | ICD-10-CM | POA: Diagnosis present

## 2019-05-02 DIAGNOSIS — R042 Hemoptysis: Secondary | ICD-10-CM | POA: Diagnosis present

## 2019-05-02 DIAGNOSIS — R0902 Hypoxemia: Secondary | ICD-10-CM

## 2019-05-02 DIAGNOSIS — A4102 Sepsis due to Methicillin resistant Staphylococcus aureus: Principal | ICD-10-CM | POA: Diagnosis present

## 2019-05-02 DIAGNOSIS — U071 COVID-19: Secondary | ICD-10-CM | POA: Diagnosis not present

## 2019-05-02 DIAGNOSIS — J189 Pneumonia, unspecified organism: Secondary | ICD-10-CM

## 2019-05-02 DIAGNOSIS — E43 Unspecified severe protein-calorie malnutrition: Secondary | ICD-10-CM | POA: Insufficient documentation

## 2019-05-02 DIAGNOSIS — R413 Other amnesia: Secondary | ICD-10-CM | POA: Diagnosis not present

## 2019-05-02 DIAGNOSIS — Z87891 Personal history of nicotine dependence: Secondary | ICD-10-CM

## 2019-05-02 DIAGNOSIS — J9611 Chronic respiratory failure with hypoxia: Secondary | ICD-10-CM | POA: Diagnosis not present

## 2019-05-02 DIAGNOSIS — N179 Acute kidney failure, unspecified: Secondary | ICD-10-CM | POA: Diagnosis present

## 2019-05-02 DIAGNOSIS — Z7951 Long term (current) use of inhaled steroids: Secondary | ICD-10-CM

## 2019-05-02 DIAGNOSIS — Z8701 Personal history of pneumonia (recurrent): Secondary | ICD-10-CM

## 2019-05-02 DIAGNOSIS — J188 Other pneumonia, unspecified organism: Secondary | ICD-10-CM | POA: Diagnosis present

## 2019-05-02 DIAGNOSIS — J9601 Acute respiratory failure with hypoxia: Secondary | ICD-10-CM | POA: Diagnosis not present

## 2019-05-02 DIAGNOSIS — Z9981 Dependence on supplemental oxygen: Secondary | ICD-10-CM

## 2019-05-02 LAB — URINALYSIS, ROUTINE W REFLEX MICROSCOPIC
Bilirubin Urine: NEGATIVE
Glucose, UA: NEGATIVE mg/dL
Hgb urine dipstick: NEGATIVE
Ketones, ur: 5 mg/dL — AB
Leukocytes,Ua: NEGATIVE
Nitrite: NEGATIVE
Protein, ur: 30 mg/dL — AB
Specific Gravity, Urine: 1.031 — ABNORMAL HIGH (ref 1.005–1.030)
pH: 5 (ref 5.0–8.0)

## 2019-05-02 LAB — COMPREHENSIVE METABOLIC PANEL
ALT: 12 U/L (ref 0–44)
AST: 18 U/L (ref 15–41)
Albumin: 1.9 g/dL — ABNORMAL LOW (ref 3.5–5.0)
Alkaline Phosphatase: 44 U/L (ref 38–126)
Anion gap: 12 (ref 5–15)
BUN: 54 mg/dL — ABNORMAL HIGH (ref 8–23)
CO2: 22 mmol/L (ref 22–32)
Calcium: 8.4 mg/dL — ABNORMAL LOW (ref 8.9–10.3)
Chloride: 105 mmol/L (ref 98–111)
Creatinine, Ser: 1.09 mg/dL (ref 0.61–1.24)
GFR calc Af Amer: >60 mL/min (ref >60)
GFR calc non Af Amer: >60 mL/min (ref >60)
Glucose, Bld: 181 mg/dL — ABNORMAL HIGH (ref 70–99)
Potassium: 4.9 mmol/L (ref 3.5–5.1)
Sodium: 139 mmol/L (ref 135–145)
Total Bilirubin: 0.7 mg/dL (ref 0.3–1.2)
Total Protein: 5.5 g/dL — ABNORMAL LOW (ref 6.5–8.1)

## 2019-05-02 LAB — CBC WITH DIFFERENTIAL/PLATELET
Abs Immature Granulocytes: 0 10*3/uL (ref 0.00–0.07)
Basophils Absolute: 0 10*3/uL (ref 0.0–0.1)
Basophils Relative: 0 %
Eosinophils Absolute: 0 10*3/uL (ref 0.0–0.5)
Eosinophils Relative: 0 %
HCT: 28.9 % — ABNORMAL LOW (ref 39.0–52.0)
Hemoglobin: 9 g/dL — ABNORMAL LOW (ref 13.0–17.0)
Lymphocytes Relative: 0 %
Lymphs Abs: 0 10*3/uL — ABNORMAL LOW (ref 0.7–4.0)
MCH: 30.3 pg (ref 26.0–34.0)
MCHC: 31.1 g/dL (ref 30.0–36.0)
MCV: 97.3 fL (ref 80.0–100.0)
Monocytes Absolute: 0.3 10*3/uL (ref 0.1–1.0)
Monocytes Relative: 1 %
Neutro Abs: 31.9 10*3/uL — ABNORMAL HIGH (ref 1.7–7.7)
Neutrophils Relative %: 99 %
Platelets: 327 10*3/uL (ref 150–400)
RBC: 2.97 MIL/uL — ABNORMAL LOW (ref 4.22–5.81)
RDW: 14.4 % (ref 11.5–15.5)
WBC: 32.2 10*3/uL — ABNORMAL HIGH (ref 4.0–10.5)
nRBC: 0 % (ref 0.0–0.2)
nRBC: 0 /100 WBC

## 2019-05-02 LAB — ABO/RH: ABO/RH(D): A POS

## 2019-05-02 LAB — APTT: aPTT: 25 seconds (ref 24–36)

## 2019-05-02 LAB — PROTIME-INR
INR: 1.3 — ABNORMAL HIGH (ref 0.8–1.2)
Prothrombin Time: 15.8 seconds — ABNORMAL HIGH (ref 11.4–15.2)

## 2019-05-02 LAB — LACTIC ACID, PLASMA
Lactic Acid, Venous: 3.7 mmol/L (ref 0.5–1.9)
Lactic Acid, Venous: 2.3 mmol/L (ref 0.5–1.9)

## 2019-05-02 LAB — MRSA PCR SCREENING: MRSA by PCR: NEGATIVE

## 2019-05-02 LAB — VALPROIC ACID LEVEL: Valproic Acid Lvl: <10 ug/mL — ABNORMAL LOW (ref 50.0–100.0)

## 2019-05-02 LAB — STREP PNEUMONIAE URINARY ANTIGEN: Strep Pneumo Urinary Antigen: NEGATIVE

## 2019-05-02 LAB — SARS CORONAVIRUS 2 (TAT 6-24 HRS): SARS Coronavirus 2: POSITIVE — AB

## 2019-05-02 MED ORDER — VANCOMYCIN HCL IN DEXTROSE 1-5 GM/200ML-% IV SOLN
1000.0000 mg | Freq: Once | INTRAVENOUS | Status: AC
  Administered 2019-05-02: 1000 mg via INTRAVENOUS
  Filled 2019-05-02: qty 200

## 2019-05-02 MED ORDER — ONDANSETRON HCL 4 MG PO TABS: 4.0000 mg | ORAL_TABLET | Freq: Four times a day (QID) | ORAL | Status: DC | PRN

## 2019-05-02 MED ORDER — SODIUM CHLORIDE 0.9% FLUSH
3.0000 mL | Freq: Two times a day (BID) | INTRAVENOUS | Status: DC
  Administered 2019-05-02 – 2019-05-11 (×17): 3 mL via INTRAVENOUS

## 2019-05-02 MED ORDER — ACETAMINOPHEN 650 MG RE SUPP: 650.0000 mg | Freq: Four times a day (QID) | RECTAL | Status: DC | PRN

## 2019-05-02 MED ORDER — SODIUM CHLORIDE 0.9 % IV SOLN
500.0000 mg | Freq: Once | INTRAVENOUS | Status: AC
  Administered 2019-05-02: 500 mg via INTRAVENOUS
  Filled 2019-05-02: qty 500

## 2019-05-02 MED ORDER — LACTATED RINGERS IV BOLUS: 2000.0000 mL | Freq: Once | INTRAVENOUS | Status: DC

## 2019-05-02 MED ORDER — VANCOMYCIN HCL 750 MG/150ML IV SOLN
750.0000 mg | Freq: Two times a day (BID) | INTRAVENOUS | Status: DC
  Filled 2019-05-02: qty 150

## 2019-05-02 MED ORDER — METHYLPREDNISOLONE SODIUM SUCC 125 MG IJ SOLR
60.0000 mg | Freq: Four times a day (QID) | INTRAMUSCULAR | Status: DC
  Administered 2019-05-02 – 2019-05-08 (×24): 60 mg via INTRAVENOUS
  Filled 2019-05-02 (×25): qty 2

## 2019-05-02 MED ORDER — SODIUM CHLORIDE 0.9 % IV SOLN
2.0000 g | Freq: Once | INTRAVENOUS | Status: AC
  Administered 2019-05-02: 2 g via INTRAVENOUS
  Filled 2019-05-02: qty 2

## 2019-05-02 MED ORDER — SODIUM CHLORIDE 0.9 % IV SOLN
2.0000 g | INTRAVENOUS | Status: DC
  Administered 2019-05-03: 2 g via INTRAVENOUS
  Filled 2019-05-02 (×2): qty 20

## 2019-05-02 MED ORDER — ALBUTEROL SULFATE HFA 108 (90 BASE) MCG/ACT IN AERS
2.0000 | INHALATION_SPRAY | RESPIRATORY_TRACT | Status: DC | PRN
  Administered 2019-05-08 – 2019-05-11 (×3): 2 via RESPIRATORY_TRACT

## 2019-05-02 MED ORDER — ALBUTEROL SULFATE HFA 108 (90 BASE) MCG/ACT IN AERS
4.0000 | INHALATION_SPRAY | RESPIRATORY_TRACT | Status: AC
  Administered 2019-05-02 (×3): 4 via RESPIRATORY_TRACT
  Filled 2019-05-02: qty 6.7

## 2019-05-02 MED ORDER — ONDANSETRON HCL 4 MG/2ML IJ SOLN: 4.0000 mg | Freq: Four times a day (QID) | INTRAMUSCULAR | Status: DC | PRN

## 2019-05-02 MED ORDER — SODIUM CHLORIDE 0.9 % IV SOLN
250.0000 mL | INTRAVENOUS | Status: DC | PRN
  Administered 2019-05-09: 250 mL via INTRAVENOUS

## 2019-05-02 MED ORDER — IPRATROPIUM BROMIDE HFA 17 MCG/ACT IN AERS
2.0000 | INHALATION_SPRAY | Freq: Four times a day (QID) | RESPIRATORY_TRACT | Status: DC
  Filled 2019-05-02: qty 12.9

## 2019-05-02 MED ORDER — LACTATED RINGERS IV BOLUS (SEPSIS)
1000.0000 mL | Freq: Once | INTRAVENOUS | Status: AC
  Administered 2019-05-02: 1000 mL via INTRAVENOUS

## 2019-05-02 MED ORDER — METHYLPREDNISOLONE SODIUM SUCC 125 MG IJ SOLR
125.0000 mg | Freq: Once | INTRAMUSCULAR | Status: AC
  Administered 2019-05-02: 125 mg via INTRAVENOUS
  Filled 2019-05-02: qty 2

## 2019-05-02 MED ORDER — SODIUM CHLORIDE 0.9 % IV SOLN
500.0000 mg | INTRAVENOUS | Status: DC
  Administered 2019-05-03: 500 mg via INTRAVENOUS
  Filled 2019-05-02: qty 500

## 2019-05-02 MED ORDER — SODIUM CHLORIDE 0.9% FLUSH: 3.0000 mL | INTRAVENOUS | Status: DC | PRN

## 2019-05-02 MED ORDER — ACETAMINOPHEN 325 MG PO TABS: 650.0000 mg | ORAL_TABLET | Freq: Four times a day (QID) | ORAL | Status: DC | PRN

## 2019-05-02 MED ORDER — SODIUM CHLORIDE 0.9% FLUSH
3.0000 mL | Freq: Two times a day (BID) | INTRAVENOUS | Status: DC
  Administered 2019-05-03 – 2019-05-11 (×14): 3 mL via INTRAVENOUS

## 2019-05-02 NOTE — ED Notes (Signed)
Pt incontinent of stool and urine. Pt turned, cleaned and repositioned. New linens provided. Skin kept clean and dry. MRSA swab collected, labeled with 2 pt identifiers, and sent to lab. Condom catheter applied

## 2019-05-02 NOTE — H&P (Addendum)
History and Physical    Paul Suarez:[id number] DOB: 02/01/1941 DOA: 05/02/2019  PCP: Pecola Lawless, MD   Patient coming from: SNF   Chief Complaint: Epistaxis   HPI: Paul Suarez is a 79 y.o. male with medical history significant for emphysema with chronic hypoxic respiratory failure, seizure disorder, hypertension, depression, coronary artery disease, and recent COVID-19 infection, now presenting from his SNF for evaluation of epistaxis.  Patient had been diagnosed with Covid approximately 3-4 weeks ago per report, had been treated with multiple rounds of antibiotics, was seen in the emergency department yesterday for possible hemoptysis and had CTA chest that was negative for large central PE but concerning for increased patchy bilateral airspace disease suggestive of a multifocal pneumonia.  He then developed epistaxis today at his nursing facility, was started on Afrin nasal spray, initially had difficulty controlling the bleeding, and EMS was called.  The bleeding had stopped, but the patient was noted to be tachycardic with low blood pressure and increased oxygen requirements, and was brought into the ED.  Patient denies any chest pain, reports that his cough and shortness of breath has been worse recently, denies any melena or hematochezia, and denies any abdominal pain.  ED Course: Upon arrival to the ED, patient is found to be afebrile, saturating mid 80s on 3 L/min of supplemental oxygen, tachypneic, tachycardic to 130, and with blood pressure 86/61.  EKG features sinus tachycardia with rate 123 and ST abnormalities that appear similar to priors.  Chest x-ray demonstrates interval worsening in bilateral pulmonary airspace disease.  Chemistry panel notable for BUN of 54, up from 26 yesterday.  Albumin is only 1.9.  CBC features a new leukocytosis to 32,200 and anemia with hemoglobin 9.0, down from 11.5 yesterday.  Initial lactic acid is 3.7.  Patient was given 2 L of lactated Ringer's,  cefepime, azithromycin, and vancomycin in the ED.  Is also treated with albuterol in the emergency department, blood and urine cultures were ordered, and COVID-19 test was ordered but not yet resulted.  Review of Systems:  All other systems reviewed and apart from HPI, are negative.  Past Medical History:  Diagnosis Date   CAD (coronary artery disease)    COPD (chronic obstructive pulmonary disease) (HCC)    Depression    Essential hypertension    Seizure disorder (HCC)     Past Surgical History:  Procedure Laterality Date   BELOW KNEE LEG AMPUTATION Left      reports that he has quit smoking. He quit after 20.00 years of use. He has never used smokeless tobacco. He reports previous alcohol use. He reports that he does not use drugs.  No Known Allergies  No family history on file.   Prior to Admission medications   Medication Sig Start Date End Date Taking? Authorizing Provider  albuterol (VENTOLIN HFA) 108 (90 Base) MCG/ACT inhaler Inhale 2 puffs into the lungs every 6 (six) hours as needed for shortness of breath (FOR Shortness of Breath  (S.O.B.)).    [provider]  aspirin EC 81 MG tablet Take 81 mg by mouth daily. 04/24/19 05/03/19  [provider]  cholecalciferol (VITAMIN D) 25 MCG (1000 UT) tablet Take 1 tablet (1,000 Units total) by mouth daily. 02/12/19   Medina-Vargas, Monina C, NP  clopidogrel (PLAVIX) 75 MG tablet Take 1 tablet (75 mg total) by mouth daily. 02/12/19   Medina-Vargas, Monina C, NP  dexamethasone (DECADRON) 2 MG tablet Take 2 mg by mouth daily.  [provider]  divalproex (DEPAKOTE ER) 500 MG 24 hr tablet Take 1 tablet (500 mg total) by mouth at bedtime. 02/12/19   Medina-Vargas, Monina C, NP  docusate sodium (COLACE) 100 MG capsule Take 1 capsule (100 mg total) by mouth at bedtime. 02/12/19   Medina-Vargas, Monina C, NP  guaiFENesin (ROBITUSSIN) 100 MG/5ML liquid Give 54ml by mouth every 6hrs for 2 weeks 05/02/19 05/17/19   [provider]  ipratropium-albuterol (DUONEB) 0.5-2.5 (3) MG/3ML SOLN Take 3 mLs by nebulization 4 (four) times daily. For SOB and Wheezing 04/28/19 05/01/19  [provider]  loratadine (CLARITIN) 10 MG tablet Take 1 tablet (10 mg total) by mouth daily. 02/12/19   Medina-Vargas, Monina C, NP  magnesium hydroxide (MILK OF MAGNESIA) 400 MG/5ML suspension If no BM in 3 days, give 30 cc Milk of Magnesium p.o. x 1 dose in 24 hours as needed (Do not use standing constipation orders for residents with renal failure CFR less than 30. Contact MD for orders) (Physician Order)    [provider]  Melatonin 3 MG TABS Take 3 mg by mouth at bedtime as needed (sleep).     [provider]  Nutritional Supplement LIQD Take 120 mLs by mouth 2 (two) times daily. MedPass    [provider]  Nutritional Supplements (NUTRITIONAL SUPPLEMENT PO) Take 1 each by mouth daily. Magic Cup    [provider]  OXYGEN Inhale 2 L into the lungs. O2 at 2L/min via [place] continuously (portable & stationary oncentrators)    [provider]  oxymetazoline (AFRIN) 0.05 % nasal spray Place 2 sprays into both nostrils 2 (two) times daily.    [provider]  polyethylene glycol powder (GLYCOLAX/MIRALAX) 17 GM/SCOOP powder Take 17 g by mouth daily. 02/12/19   Medina-Vargas, Monina C, NP  Saccharomyces boulardii (PROBIOTIC) 250 MG CAPS TAKE 1 CAPSULE BY MOUTH TWICE A DAY FOR 8 DAYS (DO NOT CRUSH) 04/25/19 05/02/19  [provider]  sertraline (ZOLOFT) 50 MG tablet Take 25 mg by mouth daily.  04/27/19 05/02/19  [provider]  tiotropium (SPIRIVA HANDIHALER) 18 MCG inhalation capsule Place 1 capsule (18 mcg total) into inhaler and inhale daily. 02/12/19   Medina-Vargas, Monina C, NP  vitamin C (ASCORBIC ACID) 500 MG tablet Take 1 tablet (500 mg total) by mouth daily. 02/12/19   Medina-Vargas, Monina C, NP  zinc sulfate 220 (50 Zn) MG capsule Take 220 mg by mouth  daily. TAKE 1 CAPSULE BY MOUTH ONCE DAILY FOR 5 DAYS FOR COVID-19 (DO NOT CRUSH) 04/19/19   [provider]    Physical Exam: Vitals:   05/02/19 1715 05/02/19 1745 05/02/19 1800 05/02/19 1821  BP: 98/77 (!) 95/59 103/62 (!) 113/58  Pulse: (!) 130 (!) 127 (!) 128 (!) 117  Resp: (!) 31 (!) 43 (!) 36 (!) 29  Temp:      TempSrc:      SpO2: (!) 88% 92% (!) 86% 94%  Weight:      Height:         Constitutional: NAD, calm  Eyes: PERTLA, lids and conjunctivae normal ENMT: Mucous membranes are moist. Posterior pharynx clear of any exudate or lesions.   Neck: normal, supple, no masses, no thyromegaly Respiratory: diminished breath sounds bilaterally, prolonged expiratory phase, expiratory wheeze, scattered rhonchi bilaterally. Tachypnea. Speaking full sentences.   Cardiovascular: Rate ~120 and regular. No extremity edema.  Abdomen: No distension, no tenderness, soft. Bowel sounds active.  Musculoskeletal: no clubbing / cyanosis. Status-post left BKA.  Skin: no significant rashes, lesions, ulcers. Warm, dry, well-perfused. Neurologic: No gross facial asymmetry. Sensation intact. Moving all extremities.  Psychiatric: Alert and oriented to person, place, and situation. Calm and cooperative.    Labs and Imaging on Admission: I have personally reviewed following labs and imaging studies  CBC: Recent Labs  Lab 05/01/19 1617 05/02/19 1647  WBC 7.4 32.2*  NEUTROABS 6.4 31.9*  HGB 11.5* 9.0*  HCT 36.8* 28.9*  MCV 93.2 97.3  PLT 376 327   Basic Metabolic Panel: Recent Labs  Lab 05/01/19 1617 05/02/19 1647  NA 141 139  K 4.8 4.9  CL 106 105  CO2 26 22  GLUCOSE 114* 181*  BUN 26* 54*  CREATININE 0.55* 1.09  CALCIUM 8.8* 8.4*   GFR: Estimated Creatinine Clearance: 52 mL/min (by C-G formula based on SCr of 1.09 mg/dL). Liver Function Tests: Recent Labs  Lab 05/01/19 1617 05/02/19 1647  AST 15 18  ALT 14 12  ALKPHOS 56 44  BILITOT 0.6 0.7  PROT 7.4 5.5*  ALBUMIN  2.6* 1.9*   No results for input(s): LIPASE, AMYLASE in the last 168 hours. No results for input(s): AMMONIA in the last 168 hours. Coagulation Profile: Recent Labs  Lab 05/01/19 1617 05/02/19 1647  INR 1.2 1.3*   Cardiac Enzymes: No results for input(s): CKTOTAL, CKMB, CKMBINDEX, TROPONINI in the last 168 hours. BNP (last 3 results) No results for input(s): PROBNP in the last 8760 hours. HbA1C: No results for input(s): HGBA1C in the last 72 hours. CBG: No results for input(s): GLUCAP in the last 168 hours. Lipid Profile: No results for input(s): CHOL, HDL, LDLCALC, TRIG, CHOLHDL, LDLDIRECT in the last 72 hours. Thyroid Function Tests: No results for input(s): TSH, T4TOTAL, FREET4, T3FREE, THYROIDAB in the last 72 hours. Anemia Panel: No results for input(s): VITAMINB12, FOLATE, FERRITIN, TIBC, IRON, RETICCTPCT in the last 72 hours. Urine analysis:    Component Value Date/Time   COLORURINE AMBER (A) 05/02/2019 1700   APPEARANCEUR CLOUDY (A) 05/02/2019 1700   LABSPEC 1.031 (H) 05/02/2019 1700   PHURINE 5.0 05/02/2019 1700   GLUCOSEU NEGATIVE 05/02/2019 1700   HGBUR NEGATIVE 05/02/2019 1700   BILIRUBINUR NEGATIVE 05/02/2019 1700   KETONESUR 5 (A) 05/02/2019 1700   PROTEINUR 30 (A) 05/02/2019 1700   NITRITE NEGATIVE 05/02/2019 1700   LEUKOCYTESUR NEGATIVE 05/02/2019 1700   Sepsis Labs: @LABRCNTIP (procalcitonin:4,lacticidven:4) )No results found for this or any previous visit (from the past 240 hour(s)).   Radiological Exams on Admission: CT Angio Chest PE W and/or Wo Contrast  Result Date: 05/01/2019 CLINICAL DATA:  Hemoptysis. EXAM: CT ANGIOGRAPHY CHEST WITH CONTRAST TECHNIQUE: Multidetector CT imaging of the chest was performed using the standard protocol during bolus administration of intravenous contrast. Multiplanar CT image reconstructions and MIPs were obtained to evaluate the vascular anatomy. CONTRAST:  05/03/2019 OMNIPAQUE IOHEXOL 350 MG/ML SOLN COMPARISON:  Chest  x-ray earlier same day FINDINGS: Cardiovascular: The heart size is normal. No substantial pericardial effusion. Coronary artery calcification is evident. Atherosclerotic calcification is noted in the wall of the thoracic aorta. No large central pulmonary embolus. No evidence for upper lobe pulmonary embolic disease. Assessment of lower lobes is markedly degraded by breathing motion making assessment of lower lobe segmental and subsegmental pulmonary arteries unreliable. Mediastinum/Nodes: 13 mm short axis right hilar node is associated with a 18 mm short axis subcarinal lymph node. No left hilar lymphadenopathy. Moderate to large hiatal hernia. There is no axillary lymphadenopathy. Lungs/Pleura: Centrilobular and paraseptal emphysema evident. Pleural scarring noted anterior  right hemithorax at the base potentially from prior empyema or hemothorax. Patchy and areas of confluent airspace disease noted posterior right upper lobe, posterior right lower lobe, and posterior left lower lobe. Airspace disease has a peripheral predominance. No pleural effusion. Biapical pleuroparenchymal scarring noted. Upper Abdomen: Small hypoattenuating lesions in the liver cannot be definitively characterized but approach water density and are likely cysts. Probable cysts noted posterior interpolar right kidney and upper pole left kidney Musculoskeletal: No worrisome lytic or sclerotic osseous abnormality. Bones are diffusely demineralized. Compression fractures are noted at T11, T12, L1 and T6. Review of the MIP images confirms the above findings. IMPRESSION: 1. No large central pulmonary embolus or evidence for pulmonary embolus to either upper lobe. Assessment of segmental and subsegmental pulmonary arteries to the lower lobes is unreliable due to respiratory breathing motion throughout image acquisition. 2. Fairly marked emphysema with patchy bilateral airspace disease, right greater than left. Airspace opacity is confluent in some  regions of the posterior right lower lobe. Multifocal pneumonia is a primary consideration. Distribution is not central as typically seen in the setting of pulmonary hemorrhage. Close follow-up recommended to ensure resolution. 3. Mediastinal and right hilar lymphadenopathy, potentially reactive. Attention on follow-up recommended to ensure resolution. 4.  Aortic Atherosclerois (ICD10-170.0) 5. Osteopenia with multiple thoracolumbar compression fractures. Electronically Signed   By: Misty Stanley M.D.   On: 05/01/2019 19:26   DG Chest Port 1 View  Result Date: 05/02/2019 CLINICAL DATA:  79 year old male with hypoxia. EXAM: PORTABLE CHEST 1 VIEW COMPARISON:  Chest radiograph dated 05/01/2019 and CT dated 05/01/2019. FINDINGS: There is background of emphysema. There has been overall interval worsening of the airspace opacities involving the right upper lobe and lingula compared to the prior radiograph. No large pleural effusion. No pneumothorax. Stable cardiomediastinal silhouette. Moderate size hiatal hernia. No acute osseous pathology. IMPRESSION: Overall interval worsening of bilateral pulmonary airspace opacities compared to the radiograph of 02/28/2020. Clinical correlation and continued follow-up recommended. Electronically Signed   By: Anner Crete M.D.   On: 05/02/2019 17:35   DG Chest Portable 1 View  Result Date: 05/01/2019 CLINICAL DATA:  Possible hemoptysis, COVID positive EXAM: PORTABLE CHEST 1 VIEW COMPARISON:  None. FINDINGS: There is increased density in the mid right lung and left greater than right lung bases. No pleural effusion or pneumothorax. Heart size is normal. IMPRESSION: Bilateral pulmonary opacities, which may reflect atypical pneumonia. Component of hemorrhage is also possible. Electronically Signed   By: Macy Mis M.D.   On: 05/01/2019 16:52    EKG: Independently reviewed. Sinus tachycardia (rate 123), ST abnormalities similar to prior.   Assessment/Plan   1.  Sepsis secondary to PNA  - Presents for eval of epistaxis which has stopped pta, but noted to be hypotensive with tachycardia, new leukocytosis, elevated lactate, AKI, increased supplemental O2 requirement, and CXR with worsening bilateral airspace disease  - Blood and urine cultures ordered in ED, 2 liters LR given, and he was treated with broad spectrum antibiotics  - Pulmonary source suspected, continue antibiotics with [place] and azithromycin for now, add sputum culture and MRSA pcr, check strep pneumo and legionella antigens, trend lactate and clinical response to treatment, and follow cultures    2. Acute kidney injury  - SCr is 1.09 in ED, up from 0.55 yesterday  - Likely prerenal azotemia in setting of hypotension  - He was fluid-resuscitated in ED  - Continue IVF hydration, hold antihypertensives, renally-dose medications, repeat chem panel in am    3.  Epistaxis and/or hemoptysis; anemia  - Patient was seen yesterday with hemoptysis and had CTA chest concerning for multifocal PNA, now returns with epistaxis that had stopped prior to arrival  - Hgb is down to 9.0 from 11.5 yesterday; prothrombin time slightly prolonged, platelets normal  - Type and screen, serial CBC, hold antiplatelets for now transfuse as needed    4. COPD with acute exacerbation  - Patient wheezing on admission with increased cough in setting of suspected PNA  - Continue albuterol and Spiriva, check sputum culture and continue systemic steroids and antibiotics    5. CAD - No anginal complaints  - Hold antiplatelets in light of recent bleeding    6. Seizure disorder  - No recent seizure, continue Depakote     7. Recent COVID-19 infection  - Patient was reportedly diagnosed with COVID on 04/09/19 as documented in progress note from his nursing home that same day. I confirmed with our Infection Prevention dept that since it has been >21 days but <90 days, he should not have been retested in ED as despite the  residual positive test, he is no longer felt to be infectious, and he should not be on special precautions or in a COVID unit. The Infection Prevention dept stated that they would also put a note in his chart to this effect. The current respiratory sxs are suspected to be secondary to a bacterial PNA which may be sequela of the recent COVID   DVT prophylaxis: SCD's  Code Status: Full, confirmed with patient  Family Communication: Discussed with patient  Disposition Plan: Will likely require SNF on discharge once sepsis resolved, respiratory status improved and stable, and bleeding/anemia stabilized.  Consults called: None  Admission status: Inpatient     Briscoe Deutscher, MD [facility] Pager: See [email or web address]  If 7AM-7PM, please contact the daytime attending [email or web address]  05/02/2019, 7:33 PM

## 2019-05-02 NOTE — ED Triage Notes (Signed)
Pt BIBA from [facility]. Initial call for nosebleed that wouldn't stop but was stopped before EMS arrival. Pt found to be hypoxic at 73% on 4L, delayed cap refill, hypotensive and tachycardic. Pt afebrile. Pt reportedly had covid and pneumonia but no longer has covid.

## 2019-05-02 NOTE — Progress Notes (Signed)
Location:  [facility] Room Number: 128/A Place of Service:  SNF (31) Provider:  Durenda Age, DNP, FNP-BC  Patient Care Team: Hendricks Limes, MD as PCP - General (Internal Medicine)  Extended Emergency Contact Information Primary Emergency Contact: Kasyn, Rolph Mobile Phone: [phone] Relation: Daughter  Code Status:  Full Code  Goals of care: Advanced Directive information Advanced Directives 05/02/2019  Does Patient Have a Medical Advance Directive? Yes  Type of Advance Directive (No Data)  Does patient want to make changes to medical advance directive? No - Patient declined  Would patient like information on creating a medical advance directive? -     Chief Complaint  Patient presents with   Acute Visit    Nose Bleed     HPI:  Pt is a 79 y.o. male who was transferred to ED yesterday, 05/01/19, after having hemoptysis. CT angiography chest with contrast showed fairly marked emphysema with patchy bilateral airspace disease, right greater than left.  Airspace opacity is confluent in some regions of the posterior right lower lobe.  Multifocal pneumonia is a primary consideration.  Distribution is not centrally as typically seen in the setting of pulmonary hemorrhage.  No large central pulmonary embolus or evidence for pulmonary embolus to either upper lobe. Labs showed wbc 7.4, hgb 11.5, hct 36.8, platelet 376, Na 141, K 4.8, glucose 114, BUN 26, creatinine 0.55, calcium 8.8, albumin 2.6, eGFR >60.  Daughter came to visit and was able to talk to patient. NP and PT updated daughter. During the visit, the patient started having epistaxis episode. Daughter requested for resident to be transferred to the hospital.   Past Medical History:  Diagnosis Date   CAD (coronary artery disease)    COPD (chronic obstructive pulmonary disease) ([place])    Depression    Essential hypertension    Seizure disorder ([place])    Past Surgical History:    Procedure Laterality Date   BELOW KNEE LEG AMPUTATION Left     No Known Allergies  Outpatient Encounter Medications as of 05/02/2019  Medication Sig   albuterol (VENTOLIN HFA) 108 (90 Base) MCG/ACT inhaler Inhale 2 puffs into the lungs every 6 (six) hours as needed for shortness of breath (FOR Shortness of Breath  (S.O.B.)).   aspirin EC 81 MG tablet Take 81 mg by mouth daily.   cholecalciferol (VITAMIN D) 25 MCG (1000 UT) tablet Take 1 tablet (1,000 Units total) by mouth daily.   clopidogrel (PLAVIX) 75 MG tablet Take 1 tablet (75 mg total) by mouth daily.   dexamethasone (DECADRON) 2 MG tablet Take 2 mg by mouth daily.    divalproex (DEPAKOTE ER) 500 MG 24 hr tablet Take 1 tablet (500 mg total) by mouth at bedtime.   docusate sodium (COLACE) 100 MG capsule Take 1 capsule (100 mg total) by mouth at bedtime.   guaiFENesin (ROBITUSSIN) 100 MG/5ML liquid Give 80m by mouth every 6hrs for 2 weeks   loratadine (CLARITIN) 10 MG tablet Take 1 tablet (10 mg total) by mouth daily.   magnesium hydroxide (MILK OF MAGNESIA) 400 MG/5ML suspension If no BM in 3 days, give 30 cc Milk of Magnesium p.o. x 1 dose in 24 hours as needed (Do not use standing constipation orders for residents with renal failure CFR less than 30. Contact MD for orders) (Physician Order)   Melatonin 3 MG TABS Take 3 mg by mouth at bedtime as needed (sleep).    Nutritional Supplement LIQD Take 120 mLs by mouth 2 (two)  times daily. MedPass   Nutritional Supplements (NUTRITIONAL SUPPLEMENT PO) Take 1 each by mouth daily. Magic Cup   OXYGEN Inhale 2 L into the lungs. O2 at 2L/min via [place] continuously (portable & stationary oncentrators)   oxymetazoline (AFRIN) 0.05 % nasal spray Place 2 sprays into both nostrils 2 (two) times daily.   polyethylene glycol powder (GLYCOLAX/MIRALAX) 17 GM/SCOOP powder Take 17 g by mouth daily.   Saccharomyces boulardii (PROBIOTIC) 250 MG CAPS TAKE 1 CAPSULE BY MOUTH TWICE A DAY FOR 8  DAYS (DO NOT CRUSH)   sertraline (ZOLOFT) 50 MG tablet Take 25 mg by mouth daily.    tiotropium (SPIRIVA HANDIHALER) 18 MCG inhalation capsule Place 1 capsule (18 mcg total) into inhaler and inhale daily.   vitamin C (ASCORBIC ACID) 500 MG tablet Take 1 tablet (500 mg total) by mouth daily.   zinc sulfate 220 (50 Zn) MG capsule Take 220 mg by mouth daily. TAKE 1 CAPSULE BY MOUTH ONCE DAILY FOR 5 DAYS FOR COVID-19 (DO NOT CRUSH)   ipratropium-albuterol (DUONEB) 0.5-2.5 (3) MG/3ML SOLN Take 3 mLs by nebulization 4 (four) times daily. For SOB and Wheezing   [DISCONTINUED] potassium chloride SA (KLOR-CON) 20 MEQ tablet Take 1 tablet (20 mEq total) by mouth daily. (Patient not taking: Reported on 05/01/2019)   No facility-administered encounter medications on file as of 05/02/2019.    Review of Systems  GENERAL: +poor appetite MOUTH and THROAT: Denies oral discomfort, gingival pain  RESPIRATORY:+SOB CARDIAC: No chest pain, edema or palpitations GI: No abdominal pain, diarrhea, constipation, heart burn, nausea or vomiting GU: Denies dysuria, frequency, hematuria, incontinence, or discharge NEUROLOGICAL: Denies dizziness, syncope, numbness, or headache PSYCHIATRIC: Denies feelings of depression or anxiety. No report of hallucinations, insomnia, paranoia, or agitation   Immunization History  Administered Date(s) Administered   Influenza-Unspecified 12/14/2018   Pertinent  Health Maintenance Due  Topic Date Due   PNA vac Low Risk Adult (1 of 2 - PCV13) 03/01/2020 (Originally 12/03/2005)   INFLUENZA VACCINE  Completed   No flowsheet data found.   Vitals:   05/02/19 1537 05/02/19 1600  BP:  126/66  Pulse:  84  Resp:  20  Temp:  98.1 F (36.7 C)  Weight:  132 lb 12.8 oz (60.2 kg)  Height: '5\' 11"'  (1.803 m)    Body mass index is 18.52 kg/m.  Physical Exam  GENERAL APPEARANCE: Cachectic MOUTH and THROAT: Lips are without lesions. Oral mucosa is moist and without lesions.  Tongue is normal in shape, size, and color and without lesions RESPIRATORY: rales on bilateral lung fields CARDIAC: RRR, no murmur,no extra heart sounds, no edema GI: Abdomen soft, normal BS, no masses, no tenderness NEUROLOGICAL: + tremor. Speech is clear. PSYCHIATRIC:  Affect and behavior are appropriate  Labs reviewed: Recent Labs    04/19/19 0000 04/23/19 0000 05/01/19 1617  NA 138 138 141  K 4.5 4.9 4.8  CL 99 100 106  CO2 24* 27* 26  GLUCOSE  --   --  114*  BUN 24* 19 26*  CREATININE 0.6 0.6 0.55*  CALCIUM 8.6* 8.7 8.8*   Recent Labs    05/01/19 1617  AST 15  ALT 14  ALKPHOS 56  BILITOT 0.6  PROT 7.4  ALBUMIN 2.6*   Recent Labs    04/16/19 0000 04/20/19 0000 05/01/19 1617  WBC 12.1 15.5 7.4  NEUTROABS  --   --  6.4  HGB 12.7* 11.8* 11.5*  HCT 37* 35* 36.8*  MCV  --   --  93.2  PLT 417* 603* 376     Significant Diagnostic Results in last 30 days:  CT Angio Chest PE W and/or Wo Contrast  Result Date: 05/01/2019 CLINICAL DATA:  Hemoptysis. EXAM: CT ANGIOGRAPHY CHEST WITH CONTRAST TECHNIQUE: Multidetector CT imaging of the chest was performed using the standard protocol during bolus administration of intravenous contrast. Multiplanar CT image reconstructions and MIPs were obtained to evaluate the vascular anatomy. CONTRAST:  154m OMNIPAQUE IOHEXOL 350 MG/ML SOLN COMPARISON:  Chest x-ray earlier same day FINDINGS: Cardiovascular: The heart size is normal. No substantial pericardial effusion. Coronary artery calcification is evident. Atherosclerotic calcification is noted in the wall of the thoracic aorta. No large central pulmonary embolus. No evidence for upper lobe pulmonary embolic disease. Assessment of lower lobes is markedly degraded by breathing motion making assessment of lower lobe segmental and subsegmental pulmonary arteries unreliable. Mediastinum/Nodes: 13 mm short axis right hilar node is associated with a 18 mm short axis subcarinal lymph node. No  left hilar lymphadenopathy. Moderate to large hiatal hernia. There is no axillary lymphadenopathy. Lungs/Pleura: Centrilobular and paraseptal emphysema evident. Pleural scarring noted anterior right hemithorax at the base potentially from prior empyema or hemothorax. Patchy and areas of confluent airspace disease noted posterior right upper lobe, posterior right lower lobe, and posterior left lower lobe. Airspace disease has a peripheral predominance. No pleural effusion. Biapical pleuroparenchymal scarring noted. Upper Abdomen: Small hypoattenuating lesions in the liver cannot be definitively characterized but approach water density and are likely cysts. Probable cysts noted posterior interpolar right kidney and upper pole left kidney Musculoskeletal: No worrisome lytic or sclerotic osseous abnormality. Bones are diffusely demineralized. Compression fractures are noted at T11, T12, L1 and T6. Review of the MIP images confirms the above findings. IMPRESSION: 1. No large central pulmonary embolus or evidence for pulmonary embolus to either upper lobe. Assessment of segmental and subsegmental pulmonary arteries to the lower lobes is unreliable due to respiratory breathing motion throughout image acquisition. 2. Fairly marked emphysema with patchy bilateral airspace disease, right greater than left. Airspace opacity is confluent in some regions of the posterior right lower lobe. Multifocal pneumonia is a primary consideration. Distribution is not central as typically seen in the setting of pulmonary hemorrhage. Close follow-up recommended to ensure resolution. 3. Mediastinal and right hilar lymphadenopathy, potentially reactive. Attention on follow-up recommended to ensure resolution. 4.  Aortic Atherosclerois (ICD10-170.0) 5. Osteopenia with multiple thoracolumbar compression fractures. Electronically Signed   By: EMisty StanleyM.D.   On: 05/01/2019 19:26   DG Chest Portable 1 View  Result Date:  05/01/2019 CLINICAL DATA:  Possible hemoptysis, COVID positive EXAM: PORTABLE CHEST 1 VIEW COMPARISON:  None. FINDINGS: There is increased density in the mid right lung and left greater than right lung bases. No pleural effusion or pneumothorax. Heart size is normal. IMPRESSION: Bilateral pulmonary opacities, which may reflect atypical pneumonia. Component of hemorrhage is also possible. Electronically Signed   By: PMacy MisM.D.   On: 05/01/2019 16:52    Assessment/Plan  1. Epistaxis - statrted having epistaxis during the compassionate visit, daughter requested for patient to be sent out for further evaluation - ordered Afrin nasal spray instill 2 sprays to each nostril BID PRN for <=3 days - continue O2 @ 2L/min via [place]    Family/ staff Communication:  Discussed plan of care with resident, daughter and charge nurse.  Labs/tests ordered:  CBC in 1 week  Goals of care:   Long-term care   MDurenda Age DNP, FNP-BC [facility]  [facility] [phone] (Monday-Friday 8:00 a.m. - 5:00 p.m.) [phone] (after hours)

## 2019-05-02 NOTE — ED Notes (Addendum)
Assumed care of pt. Pt alert, requesting on cart in NAD. Repeat lactate drawn, labeled with 2 pt identifiers, and sent to lab

## 2019-05-02 NOTE — Progress Notes (Signed)
Spoke with Dr. Marcial Pacas Opyd regarding patient's Covid 19 status.  Patient had positive Covid19 test on 04/09/2019 at his nursing facility per progress notes of M.C. Medina-Vargas, NP (04/09/2019). Patient is currently in ED with possible pneumonia per Dr. Antionette Char.  Patient is outside the Covid isolation window.  Per protocol, patient does not meet criteria for Covid isolation.

## 2019-05-02 NOTE — ED Notes (Signed)
Urine collected, labeled with 2 pt identifiers, and sent to lab 

## 2019-05-02 NOTE — ED Provider Notes (Signed)
[facility] EMERGENCY DEPARTMENT Provider Note   CSN: [id number] Arrival date & time: 05/02/19  1620     History Chief Complaint  Patient presents with  . Epistaxis  . Hypotension    Paul Suarez is a 79 y.o. male.  HPI     79 year old with history of emphysema, seizure disorder, CAD comes in a chief complaint of bloody nose and low blood pressure.  Patient resides at [facility] nursing facility.  It appears that late last year patient developed a pneumonia and required admission at [facility].  Subsequently he was transferred to nursing home facility for rehab where he acquired COVID-19 sometime around January 19.  It appears that on 211 patient was started on antibiotics for HCAP.  I spoke with patient's daughter, who informs me that patient has lost a lot of weight and looked worse today.  Patient is full code  Past Medical History:  Diagnosis Date  . CAD (coronary artery disease)   . COPD (chronic obstructive pulmonary disease) (HCC)   . Depression   . Essential hypertension   . Seizure disorder [facility])     Patient Active Problem List   Diagnosis Date Noted  . Severe sepsis (HCC) 05/02/2019  . Acute on chronic respiratory failure with hypoxia (HCC) 05/02/2019  . AKI (acute kidney injury) (HCC) 05/02/2019  . Acute blood loss anemia 05/02/2019  . Tachycardia 04/26/2019  . AMS (altered mental status) 02/13/2019  . UTI due to extended-spectrum beta lactamase (ESBL) producing Escherichia coli 02/12/2019  . Multifocal pneumonia 02/12/2019  . CAD (coronary artery disease) 02/12/2019  . Essential hypertension 02/12/2019  . Vitamin D deficiency 02/12/2019  . Seasonal allergies 02/12/2019  . Muscle spasm/History of left BKA 02/12/2019  . Insomnia 02/12/2019  . COPD with acute exacerbation (HCC) 02/12/2019  . Major depression, recurrent (HCC) 02/12/2019  . Hypokalemia 02/12/2019  . Constipation 02/12/2019  . Seizure disorder (HCC) 02/12/2019     Past Surgical History:  Procedure Laterality Date  . BELOW KNEE LEG AMPUTATION Left        No family history on file.  Social History   Tobacco Use  . Smoking status: Former Smoker    Years: 20.00  . Smokeless tobacco: Never Used  Substance Use Topics  . Alcohol use: Not Currently  . Drug use: Never    Home Medications Prior to Admission medications   Medication Sig Start Date End Date Taking? Authorizing Provider  clopidogrel (PLAVIX) 75 MG tablet Take 1 tablet (75 mg total) by mouth daily. 02/12/19  Yes Medina-Vargas, Monina C, NP  divalproex (DEPAKOTE ER) 500 MG 24 hr tablet Take 1 tablet (500 mg total) by mouth at bedtime. 02/12/19  Yes Medina-Vargas, Monina C, NP  docusate sodium (COLACE) 100 MG capsule Take 1 capsule (100 mg total) by mouth at bedtime. 02/12/19  Yes Medina-Vargas, Monina C, NP  loratadine (CLARITIN) 10 MG tablet Take 1 tablet (10 mg total) by mouth daily. 02/12/19  Yes Medina-Vargas, Monina C, NP  Nutritional Supplement LIQD Take 120 mLs by mouth 2 (two) times daily. MedPass   Yes [provider]  Nutritional Supplements (NUTRITIONAL SUPPLEMENT PO) Take 1 each by mouth daily. Magic Cup   Yes [provider]  OXYGEN Inhale 2 L into the lungs. O2 at 2L/min via [place] continuously (portable & stationary oncentrators)   Yes [provider]  sertraline (ZOLOFT) 50 MG tablet Take 25 mg by mouth daily.  04/27/19 05/02/19 Yes [provider]  tiotropium (SPIRIVA HANDIHALER) 18  MCG inhalation capsule Place 1 capsule (18 mcg total) into inhaler and inhale daily. 02/12/19  Yes Medina-Vargas, Monina C, NP  vitamin C (ASCORBIC ACID) 500 MG tablet Take 1 tablet (500 mg total) by mouth daily. 02/12/19  Yes Medina-Vargas, Monina C, NP  zinc sulfate 220 (50 Zn) MG capsule Take 220 mg by mouth daily.  04/19/19  Yes [provider]  albuterol (VENTOLIN HFA) 108 (90 Base) MCG/ACT inhaler Inhale 2 puffs into the lungs every 6 (six) hours as  needed for shortness of breath (FOR Shortness of Breath  (S.O.B.)).    [provider]  cholecalciferol (VITAMIN D) 25 MCG (1000 UT) tablet Take 1 tablet (1,000 Units total) by mouth daily. 02/12/19   Medina-Vargas, Monina C, NP  dexamethasone (DECADRON) 2 MG tablet Take 2 mg by mouth daily.     [provider]  guaiFENesin (ROBITUSSIN) 100 MG/5ML liquid Take 200 mg by mouth 4 (four) times daily as needed for cough or congestion.  05/02/19 05/17/19  [provider]  ipratropium-albuterol (DUONEB) 0.5-2.5 (3) MG/3ML SOLN Take 3 mLs by nebulization 4 (four) times daily. For SOB and Wheezing 04/28/19 05/01/19  [provider]  magnesium hydroxide (MILK OF MAGNESIA) 400 MG/5ML suspension If no BM in 3 days, give 30 cc Milk of Magnesium p.o. x 1 dose in 24 hours as needed (Do not use standing constipation orders for residents with renal failure CFR less than 30. Contact MD for orders) (Physician Order)    [provider]  Melatonin 3 MG TABS Take 3 mg by mouth at bedtime as needed (sleep).     [provider]  oxymetazoline (AFRIN) 0.05 % nasal spray Place 2 sprays into both nostrils 2 (two) times daily.    [provider]  polyethylene glycol powder (GLYCOLAX/MIRALAX) 17 GM/SCOOP powder Take 17 g by mouth daily. 02/12/19   Medina-Vargas, Monina C, NP    Allergies    Patient has no known allergies.  Review of Systems   Review of Systems  Unable to perform ROS: Acuity of condition  Constitutional: Positive for activity change.  Respiratory: Positive for cough and shortness of breath.   Cardiovascular: Positive for chest pain.  Gastrointestinal: Negative for nausea and vomiting.  Allergic/Immunologic: Negative for immunocompromised state.  All other systems reviewed and are negative.   Physical Exam Updated Vital Signs BP 110/65 (BP Location: Left Arm)   Pulse 87   Temp 97.8 F (36.6 C) (Oral)   Resp 18   Ht 5\' 11"  (1.803 m)   Wt 61  kg   SpO2 90%   BMI 18.76 kg/m   Physical Exam Vitals and nursing note reviewed.  Constitutional:      Appearance: He is well-developed.     Comments: Cachectic  HENT:     Head: Atraumatic.  Cardiovascular:     Rate and Rhythm: Tachycardia present.  Pulmonary:     Effort: No respiratory distress.     Breath sounds: Wheezing present.     Comments: Tachypneic Musculoskeletal:     Cervical back: Neck supple.  Skin:    General: Skin is warm.  Neurological:     Mental Status: He is alert and oriented to person, place, and time.     ED Results / Procedures / Treatments   Labs (all labs ordered are listed, but only abnormal results are displayed) Labs Reviewed  URINE CULTURE - Abnormal; Notable for the following components:      Result Value   Culture   (*)  Value: 5,000 COLONIES/mL ESCHERICHIA COLI SUSCEPTIBILITIES TO FOLLOW Performed at [facility], [address] [place], [place] [address]    All other components within normal limits  SARS CORONAVIRUS 2 (TAT 6-24 HRS) - Abnormal; Notable for the following components:   SARS Coronavirus 2 POSITIVE (*)    All other components within normal limits  LACTIC ACID, PLASMA - Abnormal; Notable for the following components:   Lactic Acid, Venous 3.7 (*)    All other components within normal limits  LACTIC ACID, PLASMA - Abnormal; Notable for the following components:   Lactic Acid, Venous 2.3 (*)    All other components within normal limits  COMPREHENSIVE METABOLIC PANEL - Abnormal; Notable for the following components:   Glucose, Bld 181 (*)    BUN 54 (*)    Calcium 8.4 (*)    Total Protein 5.5 (*)    Albumin 1.9 (*)    All other components within normal limits  CBC WITH DIFFERENTIAL/PLATELET - Abnormal; Notable for the following components:   WBC 32.2 (*)    RBC 2.97 (*)    Hemoglobin 9.0 (*)    HCT 28.9 (*)    Neutro Abs 31.9 (*)    Lymphs Abs 0.0 (*)    All other components within normal limits   PROTIME-INR - Abnormal; Notable for the following components:   Prothrombin Time 15.8 (*)    INR 1.3 (*)    All other components within normal limits  URINALYSIS, ROUTINE W REFLEX MICROSCOPIC - Abnormal; Notable for the following components:   Color, Urine AMBER (*)    APPearance CLOUDY (*)    Specific Gravity, Urine 1.031 (*)    Ketones, ur 5 (*)    Protein, ur 30 (*)    Bacteria, UA RARE (*)    All other components within normal limits  VALPROIC ACID LEVEL - Abnormal; Notable for the following components:   Valproic Acid Lvl <10 (*)    All other components within normal limits  COMPREHENSIVE METABOLIC PANEL - Abnormal; Notable for the following components:   Glucose, Bld 138 (*)    BUN 42 (*)    Calcium 8.6 (*)    Total Protein 5.6 (*)    Albumin 2.1 (*)    All other components within normal limits  CBC - Abnormal; Notable for the following components:   WBC 23.5 (*)    RBC 2.55 (*)    Hemoglobin 7.7 (*)    HCT 24.0 (*)    All other components within normal limits  CBC - Abnormal; Notable for the following components:   WBC 19.1 (*)    RBC 2.38 (*)    Hemoglobin 7.2 (*)    HCT 22.0 (*)    All other components within normal limits  CBC - Abnormal; Notable for the following components:   WBC 15.8 (*)    RBC 2.12 (*)    Hemoglobin 6.4 (*)    HCT 19.9 (*)    All other components within normal limits  CULTURE, BLOOD (ROUTINE X 2)  CULTURE, BLOOD (ROUTINE X 2)  MRSA PCR SCREENING  EXPECTORATED SPUTUM ASSESSMENT W REFEX TO RESP CULTURE  APTT  LACTIC ACID, PLASMA  LACTIC ACID, PLASMA  HIV ANTIBODY (ROUTINE TESTING W REFLEX)  STREP PNEUMONIAE URINARY ANTIGEN  STREP PNEUMONIAE URINARY ANTIGEN  LEGIONELLA PNEUMOPHILA SEROGP 1 UR AG  LEGIONELLA PNEUMOPHILA SEROGP 1 UR AG  BASIC METABOLIC PANEL  CBC WITH DIFFERENTIAL/PLATELET  TYPE AND SCREEN  ABO/RH  PREPARE RBC (  CROSSMATCH)    EKG EKG Interpretation  Date/Time:  Wednesday May 02 2019 16:31:13 EST  Ventricular Rate:  123 PR Interval:    QRS Duration: 80 QT Interval:  297 QTC Calculation: 425 R Axis:   15 Text Interpretation: Sinus tachycardia Consider right atrial enlargement ST elevation, consider inferior injury No acute changes No significant change since last tracing Confirmed by Derwood Kaplan ([id number]) on 05/02/2019 4:44:43 PM   Radiology DG Chest Port 1 View  Result Date: 05/02/2019 CLINICAL DATA:  79 year old male with hypoxia. EXAM: PORTABLE CHEST 1 VIEW COMPARISON:  Chest radiograph dated 05/01/2019 and CT dated 05/01/2019. FINDINGS: There is background of emphysema. There has been overall interval worsening of the airspace opacities involving the right upper lobe and lingula compared to the prior radiograph. No large pleural effusion. No pneumothorax. Stable cardiomediastinal silhouette. Moderate size hiatal hernia. No acute osseous pathology. IMPRESSION: Overall interval worsening of bilateral pulmonary airspace opacities compared to the radiograph of 02/28/2020. Clinical correlation and continued follow-up recommended. Electronically Signed   By: Elgie Collard M.D.   On: 05/02/2019 17:35    Procedures .Critical Care Performed by: Derwood Kaplan, MD Authorized by: Derwood Kaplan, MD   Critical care provider statement:    Critical care time (minutes):  68   Critical care was necessary to treat or prevent imminent or life-threatening deterioration of the following conditions:  Respiratory failure   Critical care was time spent personally by me on the following activities:  Discussions with consultants, evaluation of patient's response to treatment, examination of patient, ordering and performing treatments and interventions, ordering and review of laboratory studies, ordering and review of radiographic studies, pulse oximetry, re-evaluation of patient's condition, obtaining history from patient or surrogate and review of old charts   (including critical care time)   Medications Ordered in ED Medications  albuterol (VENTOLIN HFA) 108 (90 Base) MCG/ACT inhaler 2 puff (has no administration in time range)  methylPREDNISolone sodium succinate (SOLU-MEDROL) 125 mg/2 mL injection 60 mg (60 mg Intravenous Given 05/04/19 0230)  sodium chloride flush (NS) 0.9 % injection 3 mL (3 mLs Intravenous Given 05/03/19 2041)  sodium chloride flush (NS) 0.9 % injection 3 mL (3 mLs Intravenous Given 05/03/19 2040)  sodium chloride flush (NS) 0.9 % injection 3 mL (has no administration in time range)  0.9 %  sodium chloride infusion (has no administration in time range)  acetaminophen (TYLENOL) tablet 650 mg (has no administration in time range)    Or  acetaminophen (TYLENOL) suppository 650 mg (has no administration in time range)  ondansetron (ZOFRAN) tablet 4 mg (has no administration in time range)    Or  ondansetron (ZOFRAN) injection 4 mg (has no administration in time range)  cefTRIAXone (ROCEPHIN) 2 g in sodium chloride 0.9 % 100 mL IVPB (2 g Intravenous New Bag/Given 05/03/19 1716)  azithromycin (ZITHROMAX) 500 mg in sodium chloride 0.9 % 250 mL IVPB (500 mg Intravenous New Bag/Given 05/03/19 1723)  sertraline (ZOLOFT) tablet 25 mg (25 mg Oral Given 05/03/19 1100)  Melatonin TABS 3 mg (has no administration in time range)  divalproex (DEPAKOTE ER) 24 hr tablet 500 mg (500 mg Oral Given 05/03/19 2040)  oxymetazoline (AFRIN) 0.05 % nasal spray 2 spray (2 sprays Each Nare Given 05/03/19 2041)  umeclidinium bromide (INCRUSE ELLIPTA) 62.5 MCG/INH 1 puff (1 puff Inhalation Given 05/03/19 1034)  multivitamin with minerals tablet 1 tablet (1 tablet Oral Given 05/03/19 1412)  feeding supplement (ENSURE ENLIVE) (ENSURE ENLIVE) liquid 237 mL (237 mLs Oral Given  05/03/19 1413)  methylPREDNISolone sodium succinate (SOLU-MEDROL) 125 mg/2 mL injection 125 mg (125 mg Intravenous Given 05/02/19 1822)  albuterol (VENTOLIN HFA) 108 (90 Base) MCG/ACT inhaler 4 puff (4 puffs Inhalation Given  05/02/19 1920)  vancomycin (VANCOCIN) IVPB 1000 mg/200 mL premix (0 mg Intravenous Stopped 05/02/19 1938)  ceFEPIme (MAXIPIME) 2 g in sodium chloride 0.9 % 100 mL IVPB (0 g Intravenous Stopped 05/02/19 1938)  azithromycin (ZITHROMAX) 500 mg in sodium chloride 0.9 % 250 mL IVPB (0 mg Intravenous Stopped 05/02/19 1858)  lactated ringers bolus 1,000 mL (0 mLs Intravenous Stopped 05/02/19 1938)    And  lactated ringers bolus 1,000 mL (0 mLs Intravenous Stopped 05/02/19 1938)  0.9 %  sodium chloride infusion (Manually program via Guardrails IV Fluids) ( Intravenous New Bag/Given 05/04/19 0118)    ED Course  I have reviewed the triage vital signs and the nursing notes.  Pertinent labs & imaging results that were available during my care of the patient were reviewed by me and considered in my medical decision making (see chart for details).    MDM Rules/Calculators/A&P                      79 year old male comes in a chief complaint of worsening shortness of breath and nasal bleed.  Nasal bleed is a primary complaint and it has resolved with pressure.  It appears that the bleeding was from the right nare.  Patient is on Plavix.  He has been on oxygen for a long time which could have been drying his mucosa and leading to bleeding.  Patient is also noted to be tachypneic and requiring 4 L of oxygen today.  He has acute on chronic hypoxia.  He has a complex history with multiple respiratory infections, and unfortunately it seems like he might have developed new multifocal pneumonia.  Patient was seen yesterday and he had normal white count, today the white count is significantly elevated.  Yesterday he was not profoundly tachypneic like he is today and he also was on 3 L of oxygen versus requiring 4 L today.  It also appears that patient has not had good p.o. intake since yesterday.  CT scan from yesterday reviewed.  We will treat patient for what appears to be likely multifocal pneumonia that is getting  worse.    Family contacted and the plan discussed with them.  Patient is full code.  Paul Suarez was evaluated in Emergency Department on 05/04/2019 for the symptoms described in the history of present illness. He was evaluated in the context of the global COVID-19 pandemic, which necessitated consideration that the patient might be at risk for infection with the SARS-CoV-2 virus that causes COVID-19. Institutional protocols and algorithms that pertain to the evaluation of patients at risk for COVID-19 are in a state of rapid change based on information released by regulatory bodies including the CDC and federal and state organizations. These policies and algorithms were followed during the patient's care in the ED.   Final Clinical Impression(s) / ED Diagnoses Final diagnoses:  Acute on chronic respiratory failure with hypoxia ([place])  Severe sepsis ([place])  Multifocal pneumonia  Epistaxis    Rx / DC Orders ED Discharge Orders    None       Varney Biles, MD 05/04/19 0502

## 2019-05-02 NOTE — Progress Notes (Signed)
Pharmacy Antibiotic Note  Paul Suarez is a 79 y.o. male admitted on 05/02/2019 with pneumonia.  Pharmacy has been consulted for vancomycin dosing.  Plan: Vancomycin 1000 mg IV x 1, then 750 mg IV every 12 hours Goal AUC 400-550. Expected AUC: 501 SCr used: 0.8 Add MRSA PCR Monitor renal function, Cx/PCR to narrow Vancomycin levels at steady state   Height: 5\' 11"  (180.3 cm) Weight: 145 lb (65.8 kg) IBW/kg (Calculated) : 75.3  Temp (24hrs), Avg:98 F (36.7 C), Min:97.7 F (36.5 C), Max:98.1 F (36.7 C)  Recent Labs  Lab 05/01/19 1617  WBC 7.4  CREATININE 0.55*    Estimated Creatinine Clearance: 70.8 mL/min (A) (by C-G formula based on SCr of 0.55 mg/dL (L)).    No Known Allergies  05/03/19, PharmD Clinical Pharmacist Please check AMION for all [facility] Pharmacy numbers 05/02/2019 5:37 PM

## 2019-05-03 LAB — CBC
HCT: 19.9 % — ABNORMAL LOW (ref 39.0–52.0)
HCT: 22 % — ABNORMAL LOW (ref 39.0–52.0)
HCT: 24 % — ABNORMAL LOW (ref 39.0–52.0)
Hemoglobin: 6.4 g/dL — CL (ref 13.0–17.0)
Hemoglobin: 7.2 g/dL — ABNORMAL LOW (ref 13.0–17.0)
Hemoglobin: 7.7 g/dL — ABNORMAL LOW (ref 13.0–17.0)
MCH: 30.2 pg (ref 26.0–34.0)
MCH: 30.2 pg (ref 26.0–34.0)
MCH: 30.3 pg (ref 26.0–34.0)
MCHC: 32.1 g/dL (ref 30.0–36.0)
MCHC: 32.2 g/dL (ref 30.0–36.0)
MCHC: 32.7 g/dL (ref 30.0–36.0)
MCV: 92.4 fL (ref 80.0–100.0)
MCV: 93.9 fL (ref 80.0–100.0)
MCV: 94.1 fL (ref 80.0–100.0)
Platelets: 218 10*3/uL (ref 150–400)
Platelets: 242 10*3/uL (ref 150–400)
Platelets: 250 10*3/uL (ref 150–400)
RBC: 2.12 MIL/uL — ABNORMAL LOW (ref 4.22–5.81)
RBC: 2.38 MIL/uL — ABNORMAL LOW (ref 4.22–5.81)
RBC: 2.55 MIL/uL — ABNORMAL LOW (ref 4.22–5.81)
RDW: 14.4 % (ref 11.5–15.5)
RDW: 14.5 % (ref 11.5–15.5)
RDW: 14.6 % (ref 11.5–15.5)
WBC: 15.8 10*3/uL — ABNORMAL HIGH (ref 4.0–10.5)
WBC: 19.1 10*3/uL — ABNORMAL HIGH (ref 4.0–10.5)
WBC: 23.5 10*3/uL — ABNORMAL HIGH (ref 4.0–10.5)
nRBC: 0 % (ref 0.0–0.2)
nRBC: 0 % (ref 0.0–0.2)
nRBC: 0 % (ref 0.0–0.2)

## 2019-05-03 LAB — HIV ANTIBODY (ROUTINE TESTING W REFLEX): HIV Screen 4th Generation wRfx: NONREACTIVE

## 2019-05-03 LAB — LACTIC ACID, PLASMA
Lactic Acid, Venous: 1.8 mmol/L (ref 0.5–1.9)
Lactic Acid, Venous: 1.8 mmol/L (ref 0.5–1.9)

## 2019-05-03 LAB — COMPREHENSIVE METABOLIC PANEL
ALT: 14 U/L (ref 0–44)
AST: 16 U/L (ref 15–41)
Albumin: 2.1 g/dL — ABNORMAL LOW (ref 3.5–5.0)
Alkaline Phosphatase: 42 U/L (ref 38–126)
Anion gap: 8 (ref 5–15)
BUN: 42 mg/dL — ABNORMAL HIGH (ref 8–23)
CO2: 26 mmol/L (ref 22–32)
Calcium: 8.6 mg/dL — ABNORMAL LOW (ref 8.9–10.3)
Chloride: 107 mmol/L (ref 98–111)
Creatinine, Ser: 0.76 mg/dL (ref 0.61–1.24)
GFR calc Af Amer: >60 mL/min (ref >60)
GFR calc non Af Amer: >60 mL/min (ref >60)
Glucose, Bld: 138 mg/dL — ABNORMAL HIGH (ref 70–99)
Potassium: 4.8 mmol/L (ref 3.5–5.1)
Sodium: 141 mmol/L (ref 135–145)
Total Bilirubin: 0.5 mg/dL (ref 0.3–1.2)
Total Protein: 5.6 g/dL — ABNORMAL LOW (ref 6.5–8.1)

## 2019-05-03 LAB — STREP PNEUMONIAE URINARY ANTIGEN: Strep Pneumo Urinary Antigen: NEGATIVE

## 2019-05-03 MED ORDER — OXYMETAZOLINE HCL 0.05 % NA SOLN
2.0000 | Freq: Two times a day (BID) | NASAL | Status: AC
  Administered 2019-05-03 – 2019-05-05 (×6): 2 via NASAL
  Filled 2019-05-03: qty 30

## 2019-05-03 MED ORDER — ENSURE ENLIVE PO LIQD
237.0000 mL | Freq: Two times a day (BID) | ORAL | Status: DC
  Administered 2019-05-03 – 2019-05-08 (×9): 237 mL via ORAL

## 2019-05-03 MED ORDER — DIVALPROEX SODIUM ER 500 MG PO TB24
500.0000 mg | ORAL_TABLET | Freq: Every day | ORAL | Status: DC
  Administered 2019-05-03 – 2019-05-10 (×8): 500 mg via ORAL
  Filled 2019-05-03 (×8): qty 1

## 2019-05-03 MED ORDER — MELATONIN 3 MG PO TABS
3.0000 mg | ORAL_TABLET | Freq: Every evening | ORAL | Status: DC | PRN
  Administered 2019-05-05 – 2019-05-09 (×3): 3 mg via ORAL
  Filled 2019-05-03 (×4): qty 1

## 2019-05-03 MED ORDER — SERTRALINE HCL 50 MG PO TABS
25.0000 mg | ORAL_TABLET | Freq: Every day | ORAL | Status: DC
  Administered 2019-05-03 – 2019-05-11 (×9): 25 mg via ORAL
  Filled 2019-05-03 (×9): qty 1

## 2019-05-03 MED ORDER — UMECLIDINIUM BROMIDE 62.5 MCG/INH IN AEPB
1.0000 | INHALATION_SPRAY | Freq: Every day | RESPIRATORY_TRACT | Status: DC
  Administered 2019-05-03 – 2019-05-11 (×8): 1 via RESPIRATORY_TRACT
  Filled 2019-05-03: qty 7

## 2019-05-03 MED ORDER — ADULT MULTIVITAMIN W/MINERALS CH
1.0000 | ORAL_TABLET | Freq: Every day | ORAL | Status: DC
  Administered 2019-05-03 – 2019-05-11 (×8): 1 via ORAL
  Filled 2019-05-03 (×8): qty 1

## 2019-05-03 NOTE — TOC Initial Note (Signed)
Transition of Care [facility]) - Initial/Assessment Note    Patient Details  Name: Paul Suarez MRN: [id number] Date of Birth: 1940-04-30  [facility] CM/SW Contact:    Gildardo Griffes, LCSW Phone Number: 05/03/2019, 12:45 PM  Clinical Narrative:                  CSW spoke with patient's daughter Chrystal who confirms patient is from [place] and has been living there the past 2 months. She reports prior to this he was living at [facility] for 2.5 years. She reports she is talking to her sister and family regarding discharge plan and let CSW know what they decide, as they are debating attempting to get patient back into [place]. She reports she will call CSW back once family discussion occurs.    Expected Discharge Plan: Skilled Nursing Facility Barriers to Discharge: Continued Medical Work up   Patient Goals and CMS Choice   CMS Medicare.gov Compare Post Acute Care list provided to:: Patient Represenative (must comment)([name] (daughter)) Choice offered to / list presented to : Adult Children  Expected Discharge Plan and Services Expected Discharge Plan: Skilled Nursing Facility     Post Acute Care Choice: Skilled Nursing Facility Living arrangements for the past 2 months: Skilled Nursing Facility([facility])                                      Prior Living Arrangements/Services Living arrangements for the past 2 months: Skilled Nursing Facility([facility]) Lives with:: Self Patient language and need for interpreter reviewed:: Yes Do you feel safe going back to the place where you live?: Yes      Need for Family Participation in Patient Care: Yes (Comment) Care giver support system in place?: Yes (comment)   Criminal Activity/Legal Involvement Pertinent to Current Situation/Hospitalization: No - Comment as needed  Activities of Daily Living      Permission Sought/Granted Permission sought to share information with : Case Manager, Optician, dispensing, Family Supports Permission granted to share information with : Yes, Verbal Permission Granted  Share Information with NAME: Crhistal  Permission granted to share info w AGENCY: SNFs/ALF  Permission granted to share info w Relationship: daughter  Permission granted to share info w Contact Information: [phone]  Emotional Assessment Appearance:: Appears stated age Attitude/Demeanor/Rapport: Unable to Assess Affect (typically observed): Unable to Assess Orientation: : Oriented to Self, Oriented to Place, Oriented to  Time, Oriented to Situation Alcohol / Substance Use: Not Applicable Psych Involvement: No (comment)  Admission diagnosis:  Severe sepsis (HCC) [A41.9, R65.20] Acute on chronic respiratory failure with hypoxia (HCC) [J96.21] Multifocal pneumonia [J18.9] Patient Active Problem List   Diagnosis Date Noted  . Severe sepsis (HCC) 05/02/2019  . Acute on chronic respiratory failure with hypoxia (HCC) 05/02/2019  . AKI (acute kidney injury) (HCC) 05/02/2019  . Acute blood loss anemia 05/02/2019  . Tachycardia 04/26/2019  . AMS (altered mental status) 02/13/2019  . UTI due to extended-spectrum beta lactamase (ESBL) producing Escherichia coli 02/12/2019  . Multifocal pneumonia 02/12/2019  . CAD (coronary artery disease) 02/12/2019  . Essential hypertension 02/12/2019  . Vitamin D deficiency 02/12/2019  . Seasonal allergies 02/12/2019  . Muscle spasm/History of left BKA 02/12/2019  . Insomnia 02/12/2019  . COPD with acute exacerbation (HCC) 02/12/2019  . Major depression, recurrent (HCC) 02/12/2019  . Hypokalemia 02/12/2019  . Constipation 02/12/2019  . Seizure disorder (HCC) 02/12/2019  PCP:  Hendricks Limes, MD Pharmacy:   [facility] [address] [place] [address] [place] [address] Phone: [phone] Fax: [phone]     Social Determinants of Health (SDOH)  Interventions    Readmission Risk Interventions No flowsheet data found.

## 2019-05-03 NOTE — Progress Notes (Addendum)
PROGRESS NOTE  Paul Suarez GHW:[id number] DOB: 04-09-1940 DOA: 05/02/2019 PCP: Pecola Lawless, MD  Brief History    Paul Suarez is a 79 y.o. male with medical history significant for emphysema with chronic hypoxic respiratory failure with a 2L O2 requirement at baseline, seizure disorder, hypertension, depression, coronary artery disease, and recent COVID-19 infection, now presenting from his SNF for evaluation of epistaxis.  Patient had been diagnosed with Covid approximately 3-4 weeks ago per report, had been treated with multiple rounds of antibiotics, was seen in the emergency department yesterday for possible hemoptysis and had CTA chest that was negative for large central PE but concerning for increased patchy bilateral airspace disease suggestive of a multifocal pneumonia.  He then developed epistaxis today at his nursing facility, was started on Afrin nasal spray, initially had difficulty controlling the bleeding, and EMS was called.  The bleeding had stopped, but the patient was noted to be tachycardic with low blood pressure and increased oxygen requirements, and was brought into the ED.  Patient denies any chest pain, reports that his cough and shortness of breath has been worse recently, denies any melena or hematochezia, and denies any abdominal pain.   ED Course: Upon arrival to the ED, patient is found to be afebrile, saturating mid 80s on 3 L/min of supplemental oxygen, tachypneic, tachycardic to 130, and with blood pressure 86/61.  EKG features sinus tachycardia with rate 123 and ST abnormalities that appear similar to priors.  Chest x-ray demonstrates interval worsening in bilateral pulmonary airspace disease.  Chemistry panel notable for BUN of 54, up from 26 yesterday.  Albumin is only 1.9.  CBC features a new leukocytosis to 32,200 and anemia with hemoglobin 9.0, down from 11.5 yesterday.  Initial lactic acid is 3.7.  Patient was given 2 L of lactated Ringer's, cefepime, azithromycin,  and vancomycin in the ED.  Is also treated with albuterol in the emergency department, blood and urine cultures were ordered.  COVID-19 was tested in the ED. It was positive, as it would be expected to be. The patient initially tested positive on 04/09/2019. So, he is outside the 21 day period when he would require isolation.  [facility] were consulted to admit the patient for further evaluation and treatment.  Consultants  None  Procedures  None  Antibiotics   Anti-infectives (From admission, onward)    Start     Dose/Rate Route Frequency Ordered Stop   05/03/19 1715  cefTRIAXone (ROCEPHIN) 2 g in sodium chloride 0.9 % 100 mL IVPB     2 g 200 mL/hr over 30 Minutes Intravenous Every 24 hours 05/02/19 2024 05/08/19 1714   05/03/19 1715  azithromycin (ZITHROMAX) 500 mg in sodium chloride 0.9 % 250 mL IVPB     500 mg 250 mL/hr over 60 Minutes Intravenous Every 24 hours 05/02/19 2024 05/08/19 1714   05/03/19 0600  vancomycin (VANCOREADY) IVPB 750 mg/150 mL  Status:  Discontinued     750 mg 150 mL/hr over 60 Minutes Intravenous Every 12 hours 05/02/19 1735 05/02/19 1933   05/02/19 1715  vancomycin (VANCOCIN) IVPB 1000 mg/200 mL premix     1,000 mg 200 mL/hr over 60 Minutes Intravenous  Once 05/02/19 1705 05/02/19 1938   05/02/19 1715  ceFEPIme (MAXIPIME) 2 g in sodium chloride 0.9 % 100 mL IVPB     2 g 200 mL/hr over 30 Minutes Intravenous  Once 05/02/19 1705 05/02/19 1938   05/02/19 1715  azithromycin (ZITHROMAX) 500 mg in sodium chloride 0.9 %  250 mL IVPB     500 mg 250 mL/hr over 60 Minutes Intravenous  Once 05/02/19 1705 05/02/19 1858      Subjective  The patient is resting comfortably. No new complaints.  Objective   Vitals:  Vitals:   05/03/19 0900 05/03/19 1000  BP: 102/61 112/61  Pulse: 96 95  Resp:  20  Temp:  (!) 97.5 F (36.4 C)  SpO2:  91%   Exam:  Constitutional:  The patient is awake, alert, and oriented x 3. No acute distress. Respiratory:    Positive for accessory muscle use and tachypnea.  No wheezes, rales, or rhonchi No tactile fremitus Cardiovascular:  Regular rate and rhythm No murmurs, ectopy, or gallups. No lateral PMI. No thrills. Abdomen:  Abdomen is soft, non-tender, non-distended No hernias, masses, or organomegaly Normoactive bowel sounds.  Musculoskeletal:  No cyanosis, clubbing, or edema Skin:  No rashes, lesions, ulcers palpation of skin: no induration or nodules Neurologic:  CN 2-12 intact Sensation all 4 extremities intact Psychiatric:  Mental status Mood, affect appropriate Orientation to person, place, time  judgment and insight appear intact  I have personally reviewed the following:   Today's Data  Vitals, CBC, CMP  Imaging  CTA chest  Scheduled Meds:  divalproex  500 mg Oral QHS   feeding supplement (ENSURE ENLIVE)  237 mL Oral BID BM   methylPREDNISolone (SOLU-MEDROL) injection  60 mg Intravenous Q6H   multivitamin with minerals  1 tablet Oral Daily   oxymetazoline  2 spray Each Nare BID   sertraline  25 mg Oral Daily   sodium chloride flush  3 mL Intravenous Q12H   sodium chloride flush  3 mL Intravenous Q12H   umeclidinium bromide  1 puff Inhalation Daily   Continuous Infusions:  sodium chloride     azithromycin     cefTRIAXone (ROCEPHIN)  IV      Principal Problem:   Severe sepsis (HCC) Active Problems:   Multifocal pneumonia   CAD (coronary artery disease)   Essential hypertension   COPD with acute exacerbation (HCC)   Major depression, recurrent (HCC)   Seizure disorder (HCC)   Acute on chronic respiratory failure with hypoxia (HCC)   AKI (acute kidney injury) ([place])   Acute blood loss anemia   LOS: 1 day   A & P  Sepsis secondary to PNA : Hypotension, tachycardia, leukocytosis, lactic acidosis, AKI, and increased oxygen requirements. CXR with worsening bilateral airspace disease. Blood cultures x 2 were drawn in the ED.  Urinalysis is positive for UTI. Urine  culture has grown out E. Coli. Sensitivities are pending. They patient has been started on Rocephin and azithromycin. Blood pressures are improved, but the patient remains tachycardic. Leukocytosis is decreased as is lactic acid. Oxygen requirements are increased.   Acute kidney injury: Creatinine is down to 0.76 today. SCr was 1.09 in ED, up from 0.55 prior to admission.  The creatinine was elevated due to prerenal azotemia in setting of hypotension. The patient was fluid-resuscitated in ED. Will monitor creatinine, electrolytes, and volume status. Avoid nephrotoxic substances and hypotension. Antihypertensives are held. Continue IV fluids.   Epistaxis and/or hemoptysis: Anemia has dropped to 7.2 from 11.5 on 03/31/2019. Epistaxis has stopped. Monitor and transfuse for hemoglobin less than 7.0. Platelets are normal, but antiplatelet agents are held.    COPD with acute exacerbation: Exacerbation due to pneumonia. Continue albuterol, Spiriva, and systemic steroids with IV antibiotics. Sputum cultures pending.  CAD: Noted. Appears stable. No anginal complaints. Antiplatelet agents  are pending.    Seizure disorder: Continue Depakote. Monitor.     Recent COVID-19 infection: Patient was reportedly diagnosed with COVID on 04/09/19 as documented in progress note from his nursing home that same day. I confirmed with our Infection Prevention dept that since it has been >21 days but <90 days. So, despite positive test result from the ED, he should not be on special precautions or in a COVID unit. The Infection Prevention dept stated that they would also put a note in his chart to this effect. The current respiratory sxs are suspected to be secondary to a bacterial PNA which may be sequela of the recent COVID.   Malnutrition: Moderate and likely chronic. Pt is receiving nutritional supplements. I appreciate dietician's assistance.  I have seen and examined this patient myself. I have spent 35 minutes in his  evaluation and care.    DVT prophylaxis: SCD's  Code Status: Full, confirmed with patient  Family Communication: Discussed with patient  Disposition Plan: Will likely require SNF on discharge once sepsis resolved, respiratory status improved and stable, and bleeding/anemia stabilized.   Paul Charlesworth, DO [facility] Direct contact: see [email or web address]  7PM-7AM contact night coverage as above 05/03/2019, 1:42 PM  LOS: 1 day

## 2019-05-03 NOTE — Progress Notes (Signed)
Initial Nutrition Assessment  RD working remotely.  DOCUMENTATION CODES:   Not applicable  INTERVENTION:   -Ensure Enlive po BID, each supplement provides 350 kcal and 20 grams of protein -MVI with minerals daily -Magic cup TID with meals, each supplement provides 290 kcal and 9 grams of protein  NUTRITION DIAGNOSIS:   Increased nutrient needs related to chronic illness(COPD) as evidenced by estimated needs.  GOAL:   Patient will meet greater than or equal to 90% of their needs  MONITOR:   PO intake, Supplement acceptance, Labs, Weight trends, Skin, I & O's  REASON FOR ASSESSMENT:   Other (Comment)    ASSESSMENT:   Paul Suarez is a 79 y.o. male with medical history significant for emphysema with chronic hypoxic respiratory failure, seizure disorder, hypertension, depression, coronary artery disease, and recent COVID-19 infection, now presenting from his SNF for evaluation of epistaxis.  Patient had been diagnosed with Covid approximately 3-4 weeks ago per report, had been treated with multiple rounds of antibiotics, was seen in the emergency department yesterday for possible hemoptysis and had CTA chest that was negative for large central PE but concerning for increased patchy bilateral airspace disease suggestive of a multifocal pneumonia.  He then developed epistaxis today at his nursing facility, was started on Afrin nasal spray, initially had difficulty controlling the bleeding, and EMS was called.  The bleeding had stopped, but the patient was noted to be tachycardic with low blood pressure and increased oxygen requirements, and was brought into the ED.  Patient denies any chest pain, reports that his cough and shortness of breath has been worse recently, denies any melena or hematochezia, and denies any abdominal pain.  Pt admitted with sepsis secondary to pneumonia.   Reviewed I/O's: -400 ml x 24 hours  Attempted to speak with pt via phone, however, no answer.   Pt is a  resident of SNF and had recent COVID infection PTA. Also noted pt with lt BKA.   Pt diet just advanced to heart healthy. Per review of PTA medications, pt was receiving Med Pass BID and Magic Cup daily.   Reviewed wt hx; noted pt has experienced a 5.5% wt loss over the past year, which is significant for time frame. Given wt loss, suspect poor oral intake and possible malnutrition, however, RD unable to identify at this time. Pt would greatly benefit from addition of oral nutrition supplements.   Labs reviewed.   Diet Order:   Diet Order            Diet Heart Room service appropriate? Yes; Fluid consistency: Thin  Diet effective now              EDUCATION NEEDS:   No education needs have been identified at this time  Skin:  Skin Assessment: Reviewed RN Assessment  Last BM:  05/02/19  Height:   Ht Readings from Last 1 Encounters:  05/02/19 5\' 11"  (1.803 m)    Weight:   Wt Readings from Last 1 Encounters:  05/03/19 58.6 kg    Ideal Body Weight:  73.1 kg(adjusted for lt BKA)  BMI:  Body mass index is 18.02 kg/m.  Estimated Nutritional Needs:   Kcal:  1750-1950  Protein:  90-105 grams  Fluid:  > 1.7 L    05/05/19, RD, LDN, CDCES Registered Dietitian II Certified Diabetes Care and Education Specialist Please refer to [facility] for RD and/or RD on-call/weekend/after hours pager

## 2019-05-03 NOTE — ED Notes (Signed)
Care endorsed to Jake, RN 

## 2019-05-03 NOTE — ED Notes (Signed)
x1 unsuccessful attempt to call report. RN not available at this time

## 2019-05-03 NOTE — ED Notes (Signed)
Labs drawn, labeled with 2 pt identifiers, and sent to lab 

## 2019-05-03 NOTE — ED Notes (Signed)
Pt remains resting on cart in NAD. Breathing easy, non-labored. Speaking in full sentences. Call light within reach. Hourly rounds completed. Will continue to monitor

## 2019-05-03 NOTE — ED Notes (Signed)
Pt transported via cart to 3E28. Pt conscious, breathing, and A&Ox4. No distress noted. Pt on 6lpm [place]. All belongings with pt. Pt on monitors.

## 2019-05-04 ENCOUNTER — Inpatient Hospital Stay (HOSPITAL_COMMUNITY): Payer: Medicare Other

## 2019-05-04 LAB — LEGIONELLA PNEUMOPHILA SEROGP 1 UR AG
L. pneumophila Serogp 1 Ur Ag: NEGATIVE
L. pneumophila Serogp 1 Ur Ag: NEGATIVE

## 2019-05-04 LAB — BASIC METABOLIC PANEL
Anion gap: 10 (ref 5–15)
BUN: 34 mg/dL — ABNORMAL HIGH (ref 8–23)
CO2: 23 mmol/L (ref 22–32)
Calcium: 8.5 mg/dL — ABNORMAL LOW (ref 8.9–10.3)
Chloride: 107 mmol/L (ref 98–111)
Creatinine, Ser: 0.7 mg/dL (ref 0.61–1.24)
GFR calc Af Amer: >60 mL/min (ref >60)
GFR calc non Af Amer: >60 mL/min (ref >60)
Glucose, Bld: 152 mg/dL — ABNORMAL HIGH (ref 70–99)
Potassium: 4.1 mmol/L (ref 3.5–5.1)
Sodium: 140 mmol/L (ref 135–145)

## 2019-05-04 LAB — URINE CULTURE: Culture: 5000 — AB

## 2019-05-04 LAB — IRON AND TIBC
Iron: 23 ug/dL — ABNORMAL LOW (ref 45–182)
Saturation Ratios: 10 % — ABNORMAL LOW (ref 17.9–39.5)
TIBC: 224 ug/dL — ABNORMAL LOW (ref 250–450)
UIBC: 201 ug/dL

## 2019-05-04 LAB — CBC WITH DIFFERENTIAL/PLATELET
Abs Immature Granulocytes: 0.11 10*3/uL — ABNORMAL HIGH (ref 0.00–0.07)
Basophils Absolute: 0 10*3/uL (ref 0.0–0.1)
Basophils Relative: 0 %
Eosinophils Absolute: 0 10*3/uL (ref 0.0–0.5)
Eosinophils Relative: 0 %
HCT: 22.9 % — ABNORMAL LOW (ref 39.0–52.0)
Hemoglobin: 7.6 g/dL — ABNORMAL LOW (ref 13.0–17.0)
Immature Granulocytes: 1 %
Lymphocytes Relative: 9 %
Lymphs Abs: 1.4 10*3/uL (ref 0.7–4.0)
MCH: 30.5 pg (ref 26.0–34.0)
MCHC: 33.2 g/dL (ref 30.0–36.0)
MCV: 92 fL (ref 80.0–100.0)
Monocytes Absolute: 0.6 10*3/uL (ref 0.1–1.0)
Monocytes Relative: 4 %
Neutro Abs: 13.6 10*3/uL — ABNORMAL HIGH (ref 1.7–7.7)
Neutrophils Relative %: 86 %
Platelets: 220 10*3/uL (ref 150–400)
RBC: 2.49 MIL/uL — ABNORMAL LOW (ref 4.22–5.81)
RDW: 15 % (ref 11.5–15.5)
WBC: 15.7 10*3/uL — ABNORMAL HIGH (ref 4.0–10.5)
nRBC: 0 % (ref 0.0–0.2)

## 2019-05-04 LAB — PREPARE RBC (CROSSMATCH)

## 2019-05-04 LAB — FERRITIN: Ferritin: 80 ng/mL (ref 24–336)

## 2019-05-04 LAB — HEMOGLOBIN AND HEMATOCRIT, BLOOD
HCT: 22.8 % — ABNORMAL LOW (ref 39.0–52.0)
Hemoglobin: 7.5 g/dL — ABNORMAL LOW (ref 13.0–17.0)

## 2019-05-04 MED ORDER — SODIUM CHLORIDE 0.9 % IV SOLN
2.0000 g | INTRAVENOUS | Status: DC
  Administered 2019-05-04: 2 g via INTRAVENOUS
  Filled 2019-05-04 (×2): qty 20

## 2019-05-04 MED ORDER — GUAIFENESIN ER 600 MG PO TB12
1200.0000 mg | ORAL_TABLET | Freq: Two times a day (BID) | ORAL | Status: DC
  Administered 2019-05-04 – 2019-05-11 (×14): 1200 mg via ORAL
  Filled 2019-05-04 (×14): qty 2

## 2019-05-04 MED ORDER — SODIUM CHLORIDE 0.9 % IV SOLN
510.0000 mg | Freq: Once | INTRAVENOUS | Status: AC
  Administered 2019-05-04: 510 mg via INTRAVENOUS
  Filled 2019-05-04: qty 17

## 2019-05-04 MED ORDER — SODIUM CHLORIDE 0.9% IV SOLUTION: Freq: Once | INTRAVENOUS | Status: AC

## 2019-05-04 MED ORDER — SODIUM CHLORIDE 0.9 % IV SOLN
500.0000 mg | INTRAVENOUS | Status: DC
  Administered 2019-05-04: 500 mg via INTRAVENOUS
  Filled 2019-05-04: qty 500

## 2019-05-04 NOTE — NC FL2 (Signed)
[place] MEDICAID FL2 LEVEL OF CARE SCREENING TOOL     IDENTIFICATION  Patient Name: Paul Suarez Birthdate: Dec 13, 1940 Sex: male Admission Date (Current Location): 05/02/2019  [facility] and [place] Number:  Herbalist and Address:  [facility], [place] [address] [place], [place] [address]      Provider Number: [id number]  Attending Physician Name and Address:  Karie Kirks, DO  Relative Name and Phone Number:  Huey Bienenstock (daughter) [phone]    Current Level of Care: Hospital Recommended Level of Care: [place] Prior Approval Number:    Date Approved/Denied:   PASRR Number: [id number]  Discharge Plan: SNF    Current Diagnoses: Patient Active Problem List   Diagnosis Date Noted  . Severe sepsis ([place]) 05/02/2019  . Acute on chronic respiratory failure with hypoxia ([place]) 05/02/2019  . AKI (acute kidney injury) ([place]) 05/02/2019  . Acute blood loss anemia 05/02/2019  . Tachycardia 04/26/2019  . AMS (altered mental status) 02/13/2019  . UTI due to extended-spectrum beta lactamase (ESBL) producing Escherichia coli 02/12/2019  . Multifocal pneumonia 02/12/2019  . CAD (coronary artery disease) 02/12/2019  . Essential hypertension 02/12/2019  . Vitamin D deficiency 02/12/2019  . Seasonal allergies 02/12/2019  . Muscle spasm/History of left BKA 02/12/2019  . Insomnia 02/12/2019  . COPD with acute exacerbation ([place]) 02/12/2019  . Major depression, recurrent ([place]) 02/12/2019  . Hypokalemia 02/12/2019  . Constipation 02/12/2019  . Seizure disorder ([place]) 02/12/2019    Orientation RESPIRATION BLADDER Height & Weight     Self, Time  O2(nasal cannula 6L/min) Incontinent, External catheter Weight: 134 lb 7.7 oz (61 kg) Height:  5\' 11"  (180.3 cm)  BEHAVIORAL SYMPTOMS/MOOD NEUROLOGICAL BOWEL NUTRITION STATUS      Continent Diet(see discharge summary)  AMBULATORY STATUS COMMUNICATION OF NEEDS Skin   Extensive Assist Verbally Other  (Comment)(ecchymosis arms)                       Personal Care Assistance Level of Assistance  Bathing, Feeding, Dressing, Total care Bathing Assistance: Limited assistance Feeding assistance: Independent Dressing Assistance: Maximum assistance Total Care Assistance: Maximum assistance   Functional Limitations Info  Sight, Hearing, Speech Sight Info: Adequate Hearing Info: Impaired Speech Info: Adequate    SPECIAL CARE FACTORS FREQUENCY  PT (By licensed PT), OT (By licensed OT)     PT Frequency: min 5x weekly OT Frequency: min 5x weekly            Contractures Contractures Info: Not present    Additional Factors Info  Code Status, Allergies Code Status Info: full Allergies Info: No Known Allergies           Current Medications (05/04/2019):  This is the current hospital active medication list Current Facility-Administered Medications  Medication Dose Route Frequency Provider Last Rate Last Admin  . 0.9 %  sodium chloride infusion  250 mL Intravenous PRN Opyd, Ilene Qua, MD      . acetaminophen (TYLENOL) tablet 650 mg  650 mg Oral Q6H PRN Opyd, Ilene Qua, MD       Or  . acetaminophen (TYLENOL) suppository 650 mg  650 mg Rectal Q6H PRN Opyd, Ilene Qua, MD      . albuterol (VENTOLIN HFA) 108 (90 Base) MCG/ACT inhaler 2 puff  2 puff Inhalation Q4H PRN Opyd, Ilene Qua, MD      . azithromycin (ZITHROMAX) 500 mg in sodium chloride 0.9 % 250 mL IVPB  500 mg Intravenous Q24H Bridgett Larsson,  Lauretta Chester, RPH      . cefTRIAXone (ROCEPHIN) 2 g in sodium chloride 0.9 % 100 mL IVPB  2 g Intravenous Q24H Leander Rams, [facility]      . divalproex (DEPAKOTE ER) 24 hr tablet 500 mg  500 mg Oral QHS Opyd, Lavone Neri, MD   500 mg at 05/03/19 2040  . feeding supplement (ENSURE ENLIVE) (ENSURE ENLIVE) liquid 237 mL  237 mL Oral BID BM Swayze, Ava, DO   237 mL at 05/04/19 1457  . guaiFENesin (MUCINEX) 12 hr tablet 1,200 mg  1,200 mg Oral BID Swayze, Ava, DO   1,200 mg at 05/04/19 1451  . Melatonin TABS 3  mg  3 mg Oral QHS PRN Opyd, Lavone Neri, MD      . methylPREDNISolone sodium succinate (SOLU-MEDROL) 125 mg/2 mL injection 60 mg  60 mg Intravenous Q6H Opyd, Lavone Neri, MD   60 mg at 05/04/19 1452  . multivitamin with minerals tablet 1 tablet  1 tablet Oral Daily Swayze, Ava, DO   1 tablet at 05/04/19 0954  . ondansetron (ZOFRAN) tablet 4 mg  4 mg Oral Q6H PRN Opyd, Lavone Neri, MD       Or  . ondansetron (ZOFRAN) injection 4 mg  4 mg Intravenous Q6H PRN Opyd, Lavone Neri, MD      . oxymetazoline (AFRIN) 0.05 % nasal spray 2 spray  2 spray Each Nare BID Opyd, Lavone Neri, MD   2 spray at 05/04/19 0956  . sertraline (ZOLOFT) tablet 25 mg  25 mg Oral Daily Opyd, Lavone Neri, MD   25 mg at 05/04/19 0956  . sodium chloride flush (NS) 0.9 % injection 3 mL  3 mL Intravenous Q12H Opyd, Lavone Neri, MD   3 mL at 05/04/19 0957  . sodium chloride flush (NS) 0.9 % injection 3 mL  3 mL Intravenous Q12H Opyd, Lavone Neri, MD   3 mL at 05/04/19 0957  . sodium chloride flush (NS) 0.9 % injection 3 mL  3 mL Intravenous PRN Opyd, Lavone Neri, MD      . umeclidinium bromide (INCRUSE ELLIPTA) 62.5 MCG/INH 1 puff  1 puff Inhalation Daily Opyd, Lavone Neri, MD   1 puff at 05/04/19 8466     Discharge Medications: Please see discharge summary for a list of discharge medications.  Relevant Imaging Results:  Relevant Lab Results:   Additional Information SSN: [id number]  Gildardo Griffes, LCSW

## 2019-05-04 NOTE — TOC Progression Note (Signed)
Transition of Care [facility]) - Progression Note    Patient Details  Name: Paul Suarez MRN: [id number] Date of Birth: May 02, 1940  [facility]) CM/SW Contact  Gildardo Griffes, [place] Phone Number: [phone] 05/04/2019, 3:48 PM  Clinical Narrative:     Patient's daughter Paul Suarez called CSW to follow up on discharge planning, CSW informed her that PT recommending SNF. She inquired regarding CIR as she was aware of the program, CSW has [place] with IP assess and reports CIR not appropriate at this time, rec remains SNF. Paul Suarez was updated and agreeable for referrals to be sent for SNF bed offers.   CSW has faxed out referrals for bed offers, pending bed offers at this time.   Expected Discharge Plan: Skilled Nursing Facility Barriers to Discharge: Continued Medical Work up  Expected Discharge Plan and Services Expected Discharge Plan: Skilled Nursing Facility     Post Acute Care Choice: Skilled Nursing Facility Living arrangements for the past 2 months: Skilled Nursing Facility([facility])                                       Social Determinants of Health (SDOH) Interventions    Readmission Risk Interventions No flowsheet data found.

## 2019-05-04 NOTE — Evaluation (Signed)
Physical Therapy Evaluation Patient Details Name: Paul Suarez MRN: [id number] DOB: [birth year] Today's Date: 05/04/2019   History of Present Illness  Patient is a 79 y/o male who presents from [place] with epistaxis. Admitted with sepsis secondary to PNA. PMH includes seizures, HTN, depression, CAd, COPD,  emphysema with chronic hypoxic respiratory failure with a 2L O2 requirement at baseline, recent Covid-10 infection.  Clinical Impression  Patient presents with generalized weakness, dyspnea at rest/with exertion, impaired balance, decreased activity tolerance, impaired cognition and impaired mobility s/p above. Daughter present for evaluation and provided PLOF/history. Pt has been at [facility] and has been transferring to w/c independently as well as propelling w/c for mobility up until 3 weeks ago. Pt has been bed-bound for the last 3 weeks. Today, pt requires Max A for bed mobility and for static sitting balance. Fatigues quickly and easily. HR noted to be 136 bpm and Sp02 70-83% on 6L/min 02 [place] with increased RR. Encouraged pt to cough and noted to cough up some blood. RN notified of above. Pt is a mouth breather at baseline. Recommend acapella. Would benefit from return to SNF to maximize independence and mobility prior to return to ALF. Will follow acutely.    Follow Up Recommendations SNF;Supervision for mobility/OOB    Equipment Recommendations  None recommended by PT    Recommendations for Other Services       Precautions / Restrictions Precautions Precautions: Fall Precaution Comments: watch 02 and HR Restrictions Weight Bearing Restrictions: No      Mobility  Bed Mobility Overal bed mobility: Needs Assistance Bed Mobility: Supine to Sit;Sit to Supine     Supine to sit: Max assist;HOB elevated Sit to supine: Max assist;HOB elevated   General bed mobility comments: Assist to bring LEs to EOB, scoot bottom and elevate trunk; manual assist to reach for rail.  Transfers                 General transfer comment: Deferred due to poor static sitting balance; Sp02 70% on 6L/min 02 [place] and HR 136 bpm. Pt with increased RR, gargling present. RN made aware.  Ambulation/Gait                Stairs            Wheelchair Mobility    Modified Rankin (Stroke Patients Only)       Balance Overall balance assessment: Needs assistance Sitting-balance support: Bilateral upper extremity supported(foot supported) Sitting balance-Leahy Scale: Zero Sitting balance - Comments: Max A for static sitting balance with posterior lean; fatigues quickly. Postural control: Posterior lean                                   Pertinent Vitals/Pain      Home Living Family/patient expects to be discharged to:: Skilled nursing facility                 Additional Comments: [facility]    Prior Function Level of Independence: Needs assistance   Gait / Transfers Assistance Needed: Has not walked in 3 years, transfers to w/c independently and propels w/c at baseline.  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 knee flexion contracture. Limited hip flexion AROM.    Cervical / Trunk Assessment Cervical / Trunk Assessment: Kyphotic  Communication   Communication: No difficulties  Cognition Arousal/Alertness: Awake/alert Behavior During Therapy: WFL for tasks assessed/performed Overall Cognitive Status: Impaired/Different from  baseline Area of Impairment: Orientation;Following commands;Memory;Problem solving                 Orientation Level: Disoriented to;Situation;Time   Memory: Decreased short-term memory Following Commands: Follows one step commands with increased time;Follows one step commands inconsistently     Problem Solving: Slow processing;Decreased initiation;Difficulty sequencing;Requires verbal cues;Requires tactile cues General Comments: Thinks he was at [facility]). Talking about police officers bringing him over here twice. Appears confused and not the best at answering questions appropriately.      General Comments General comments (skin integrity, edema, etc.): Daughter present during session. Sp02 70%-83% on 6L/min 02 [place] and HR 136 bpm. Pt with increased RR, gargling present. RN made aware.    Exercises     Assessment/Plan    PT Assessment Patient needs continued PT services  PT Problem List Decreased strength;Decreased mobility;Decreased safety awareness;Decreased balance;Cardiopulmonary status limiting activity;Decreased cognition;Decreased activity tolerance;Decreased range of motion       PT Treatment Interventions Therapeutic activities;Functional mobility training;Balance training;Wheelchair mobility training;Patient/family education;Therapeutic exercise;Cognitive remediation    PT Goals (Current goals can be found in the Care Plan section)  Acute Rehab PT Goals Patient Stated Goal: per daughter, to get him a breathing treatment and to cough this stuff up PT Goal Formulation: With family Time For Goal Achievement: 05/18/19 Potential to Achieve Goals: Fair    Frequency Min 2X/week   Barriers to discharge        Co-evaluation               AM-PAC PT "6 Clicks" Mobility  Outcome Measure Help needed turning from your back to your side while in a flat bed without using bedrails?: A Lot Help needed moving from lying on your back to sitting on  the side of a flat bed without using bedrails?: Total Help needed moving to and from a bed to a chair (including a wheelchair)?: Total Help needed standing up from a chair using your arms (e.g., wheelchair or bedside chair)?: Total Help needed to walk in hospital room?: Total Help needed climbing 3-5 steps with a railing? : Total 6 Click Score: 7    End of Session Equipment Utilized During Treatment: Oxygen Activity Tolerance: Treatment limited secondary to medical complications (Comment);Patient limited by fatigue(decreased 02, elevated HR, increased RR, gargling) Patient left: in bed;with call bell/phone within reach;with bed alarm set;with family/visitor present Nurse Communication: Mobility status;Other (comment)(vitals) PT Visit Diagnosis: Muscle weakness (generalized) (M62.81)    Time: 1341(1410-1420)-1400 PT Time Calculation (min) (ACUTE ONLY): 19 min   Charges:   PT Evaluation $PT Eval Moderate Complexity: 1 Mod          Vale Haven, PT, DPT Acute Rehabilitation Services Pager [phone] Office [phone]      Blake Divine A Lanier Ensign 05/04/2019, 3:23 PM

## 2019-05-04 NOTE — Progress Notes (Signed)
Pt IV site was removed, due to site infiltration, during blood administration.  RN notified oncall MD via Amion.  Pt denies any pain at IV sited during this time.  Pt right arm elevated, and alternating heat and cold compress.

## 2019-05-04 NOTE — Progress Notes (Signed)
PROGRESS NOTE  Eshawn Coor BTD:[id number] DOB: 12-10-1940 DOA: 05/02/2019 PCP: Pecola Lawless, MD  Brief History    Cylas Falzone is a 79 y.o. male with medical history significant for emphysema with chronic hypoxic respiratory failure with a 2L O2 requirement at baseline, seizure disorder, hypertension, depression, coronary artery disease, and recent COVID-19 infection, now presenting from his SNF for evaluation of epistaxis.  Patient had been diagnosed with Covid approximately 3-4 weeks ago per report, had been treated with multiple rounds of antibiotics, was seen in the emergency department yesterday for possible hemoptysis and had CTA chest that was negative for large central PE but concerning for increased patchy bilateral airspace disease suggestive of a multifocal pneumonia.  He then developed epistaxis today at his nursing facility, was started on Afrin nasal spray, initially had difficulty controlling the bleeding, and EMS was called.  The bleeding had stopped, but the patient was noted to be tachycardic with low blood pressure and increased oxygen requirements, and was brought into the ED.  Patient denies any chest pain, reports that his cough and shortness of breath has been worse recently, denies any melena or hematochezia, and denies any abdominal pain.   ED Course: Upon arrival to the ED, patient is found to be afebrile, saturating mid 80s on 3 L/min of supplemental oxygen, tachypneic, tachycardic to 130, and with blood pressure 86/61.  EKG features sinus tachycardia with rate 123 and ST abnormalities that appear similar to priors.  Chest x-ray demonstrates interval worsening in bilateral pulmonary airspace disease.  Chemistry panel notable for BUN of 54, up from 26 yesterday.  Albumin is only 1.9.  CBC features a new leukocytosis to 32,200 and anemia with hemoglobin 9.0, down from 11.5 yesterday.  Initial lactic acid is 3.7.  Patient was given 2 L of lactated Ringer's, cefepime, azithromycin,  and vancomycin in the ED.  Is also treated with albuterol in the emergency department, blood and urine cultures were ordered.  COVID-19 was tested in the ED. It was positive, as it would be expected to be. The patient initially tested positive on 04/09/2019. So, he is outside the 21 day period when he would require isolation.  [facility] were consulted to admit the patient for further evaluation and treatment.  The patient is receiving IV steroids which are being tapered down, nebulizer treatments, and rocephin and azithromycin. He received IV fluids for correction of his acute renal insufficiency. He is receiving supplemental O2. Nutrition has been consulted for evaluation and treatment of his malnourished status. His oxygen requirements remain high.  Consultants  . None  Procedures  . None  Antibiotics   Anti-infectives (From admission, onward)   Start     Dose/Rate Route Frequency Ordered Stop   05/04/19 1715  azithromycin (ZITHROMAX) 500 mg in sodium chloride 0.9 % 250 mL IVPB     500 mg 250 mL/hr over 60 Minutes Intravenous Every 24 hours 05/04/19 1030 05/07/19 1714   05/04/19 1715  cefTRIAXone (ROCEPHIN) 2 g in sodium chloride 0.9 % 100 mL IVPB     2 g 200 mL/hr over 30 Minutes Intravenous Every 24 hours 05/04/19 1030 05/07/19 1714   05/03/19 1715  cefTRIAXone (ROCEPHIN) 2 g in sodium chloride 0.9 % 100 mL IVPB  Status:  Discontinued     2 g 200 mL/hr over 30 Minutes Intravenous Every 24 hours 05/02/19 2024 05/04/19 1030   05/03/19 1715  azithromycin (ZITHROMAX) 500 mg in sodium chloride 0.9 % 250 mL IVPB  Status:  Discontinued  500 mg 250 mL/hr over 60 Minutes Intravenous Every 24 hours 05/02/19 2024 05/04/19 1030   05/03/19 0600  vancomycin (VANCOREADY) IVPB 750 mg/150 mL  Status:  Discontinued     750 mg 150 mL/hr over 60 Minutes Intravenous Every 12 hours 05/02/19 1735 05/02/19 1933   05/02/19 1715  vancomycin (VANCOCIN) IVPB 1000 mg/200 mL premix     1,000  mg 200 mL/hr over 60 Minutes Intravenous  Once 05/02/19 1705 05/02/19 1938   05/02/19 1715  ceFEPIme (MAXIPIME) 2 g in sodium chloride 0.9 % 100 mL IVPB     2 g 200 mL/hr over 30 Minutes Intravenous  Once 05/02/19 1705 05/02/19 1938   05/02/19 1715  azithromycin (ZITHROMAX) 500 mg in sodium chloride 0.9 % 250 mL IVPB     500 mg 250 mL/hr over 60 Minutes Intravenous  Once 05/02/19 1705 05/02/19 1858     Subjective  The patient is resting comfortably. He has a coarse cough today.  Objective   Vitals:  Vitals:   05/04/19 1156 05/04/19 1200  BP: (!) 110/59   Pulse: (!) 108   Resp: 20   Temp:    SpO2: (!) 89% 92%   Exam:  Constitutional:  . The patient is awake, alert, and oriented x 3. No acute distress. Respiratory:  . Positive for accessory muscle use and tachypnea.  Marland Kitchen Positive for rales and rhonchi. No wheezes. . No tactile fremitus Cardiovascular:  . Regular rate and rhythm . No murmurs, ectopy, or gallups. . No lateral PMI. No thrills. Abdomen:  . Abdomen is soft, non-tender, non-distended . No hernias, masses, or organomegaly . Normoactive bowel sounds.  Musculoskeletal:  . No cyanosis, clubbing, or edema Skin:  . No rashes, lesions, ulcers . palpation of skin: no induration or nodules Neurologic:  . CN 2-12 intact . Sensation all 4 extremities intact Psychiatric:  . Mental status o Mood, affect appropriate o Orientation to person, place, time  . judgment and insight appear intact  I have personally reviewed the following:   Today's Data  . Vitals, CBC, CMP, Iron studies  Imaging  . CTA chest  Scheduled Meds: . divalproex  500 mg Oral QHS  . feeding supplement (ENSURE ENLIVE)  237 mL Oral BID BM  . methylPREDNISolone (SOLU-MEDROL) injection  60 mg Intravenous Q6H  . multivitamin with minerals  1 tablet Oral Daily  . oxymetazoline  2 spray Each Nare BID  . sertraline  25 mg Oral Daily  . sodium chloride flush  3 mL Intravenous Q12H  . sodium  chloride flush  3 mL Intravenous Q12H  . umeclidinium bromide  1 puff Inhalation Daily   Continuous Infusions: . sodium chloride    . azithromycin    . cefTRIAXone (ROCEPHIN)  IV      Principal Problem:   Severe sepsis (HCC) Active Problems:   Multifocal pneumonia   CAD (coronary artery disease)   Essential hypertension   COPD with acute exacerbation (HCC)   Major depression, recurrent (HCC)   Seizure disorder (HCC)   Acute on chronic respiratory failure with hypoxia (HCC)   AKI (acute kidney injury) ([place])   Acute blood loss anemia   LOS: 2 days   A & P  Sepsis secondary to PNA : Hypotension, tachycardia, leukocytosis, lactic acidosis, AKI, and increased oxygen requirements. CXR with worsening bilateral airspace disease. Blood cultures x 2 were drawn in the ED.  Urinalysis is positive for UTI. Urine culture has grown out E. Coli. Sensitivities are pending. They  patient has been started on Rocephin and azithromycin. Blood pressures are improved, but the patient remains tachycardic. Leukocytosis is decreased as is lactic acid. Oxygen requirements are increased. Today lung sounds were very coarse and oxygen requirements are increased. Will check CXR. Will add guaifenesin, incentive spirometry, and flutter valve.   Acute kidney injury: Creatinine is down to 0.70 today. SCr was 1.09 in ED, up from 0.55 prior to admission.  The creatinine was elevated due to prerenal azotemia in setting of hypotension. The patient was fluid-resuscitated in ED. Will monitor creatinine, electrolytes, and volume status. Avoid nephrotoxic substances and hypotension. Antihypertensives are held. Continue IV fluids.   Epistaxis and/or hemoptysis: Anemia has dropped to 7.2 from 11.5 on 03/31/2019. Epistaxis has stopped. Monitor and transfuse for hemoglobin less than 7.0. Platelets are normal, but antiplatelet agents are held.  Iron studies reveal severe iron deficiency. I will supplement.   COPD with acute  exacerbation: Exacerbation due to pneumonia. Continue albuterol, Spiriva, and systemic steroids with IV antibiotics. Sputum cultures pending.  CAD: Noted. Appears stable. No anginal complaints. Antiplatelet agents are pending.    Seizure disorder: Continue Depakote. Monitor.     Recent COVID-19 infection: Patient was reportedly diagnosed with COVID on 04/09/19 as documented in progress note from his nursing home that same day. I confirmed with our Infection Prevention dept that since it has been >21 days but <90 days. So, despite positive test result from the ED, he should not be on special precautions or in a COVID unit. The Infection Prevention dept stated that they would also put a note in his chart to this effect. The current respiratory sxs are suspected to be secondary to a bacterial PNA which may be sequela of the recent COVID.   Malnutrition: Moderate and likely chronic. Pt is receiving nutritional supplements. I appreciate dietician's assistance.  I have seen and examined this patient myself. I have spent 35 minutes in his evaluation and care.    DVT prophylaxis: SCD's  Code Status: Full, confirmed with patient  Family Communication: Discussed with patient  Disposition Plan: Will likely require SNF on discharge once sepsis resolved, respiratory status improved and stable, and bleeding/anemia stabilized.   Eternity Dexter, DO [facility] Direct contact: see [email or web address]  7PM-7AM contact night coverage as above 05/04/2019, 1:53 PM  LOS: 1 day

## 2019-05-04 NOTE — Progress Notes (Signed)
Inpatient Rehabilitation Admissions Coordinator  Asked by SW, Morrie Sheldon, to review case for possible admit to CIR. Based on his current level of tolerance with activity. He is not at a level to consider for CIR admit. Recommend SNF. I will follow his progress.  Ottie Glazier, RN, MSN Rehab Admissions Coordinator [phone] 05/04/2019 3:40 PM

## 2019-05-05 ENCOUNTER — Other Ambulatory Visit: Payer: Self-pay

## 2019-05-05 ENCOUNTER — Encounter (HOSPITAL_COMMUNITY): Payer: Self-pay

## 2019-05-05 LAB — MRSA PCR SCREENING: MRSA by PCR: NEGATIVE

## 2019-05-05 MED ORDER — PIPERACILLIN-TAZOBACTAM 3.375 G IVPB
3.3750 g | Freq: Three times a day (TID) | INTRAVENOUS | Status: DC
  Administered 2019-05-05 – 2019-05-06 (×3): 3.375 g via INTRAVENOUS
  Filled 2019-05-05 (×4): qty 50

## 2019-05-05 MED ORDER — PIPERACILLIN-TAZOBACTAM 3.375 G IVPB 30 MIN: 3.3750 g | Freq: Four times a day (QID) | INTRAVENOUS | Status: DC

## 2019-05-05 NOTE — Progress Notes (Signed)
PHARMACY - PHYSICIAN COMMUNICATION CRITICAL VALUE ALERT - BLOOD CULTURE IDENTIFICATION (BCID)  Paul Suarez is an 79 y.o. male who presented to [facility] on 05/02/2019 with a chief complaint of SOB  Assessment:  1/4 BC with gpc, no BCID  Name of physician (or Provider) Contacted: X Blount  Current antibiotics: rocephin and azithromycin  Changes to prescribed antibiotics recommended:  none  No results found for this or any previous visit.  Paul Suarez 05/05/2019  3:20 AM

## 2019-05-05 NOTE — Progress Notes (Signed)
Asked to evaluate patients oxygenation status. Patient was wearing oxygen set at 7lpm with Sp02=80-82%. Increased patients oxygen to 15lpm with Sp02 increasing to 93-95%. Patient does not appear to be in any respiratory distress. Patient stated he was breathing fine. Will continue to monitor patient.

## 2019-05-05 NOTE — Progress Notes (Signed)
PROGRESS NOTE  Paul Suarez GQQ:[id number] DOB: 1940/10/05 DOA: 05/02/2019 PCP: Pecola Lawless, MD  Brief History    Paul Suarez is a 79 y.o. male with medical history significant for emphysema with chronic hypoxic respiratory failure with a 2L O2 requirement at baseline, seizure disorder, hypertension, depression, coronary artery disease, and recent COVID-19 infection, now presenting from his SNF for evaluation of epistaxis.  Patient had been diagnosed with Covid approximately 3-4 weeks ago per report, had been treated with multiple rounds of antibiotics, was seen in the emergency department yesterday for possible hemoptysis and had CTA chest that was negative for large central PE but concerning for increased patchy bilateral airspace disease suggestive of a multifocal pneumonia.  He then developed epistaxis today at his nursing facility, was started on Afrin nasal spray, initially had difficulty controlling the bleeding, and EMS was called.  The bleeding had stopped, but the patient was noted to be tachycardic with low blood pressure and increased oxygen requirements, and was brought into the ED.  Patient denies any chest pain, reports that his cough and shortness of breath has been worse recently, denies any melena or hematochezia, and denies any abdominal pain.   ED Course: Upon arrival to the ED, patient is found to be afebrile, saturating mid 80s on 3 L/min of supplemental oxygen, tachypneic, tachycardic to 130, and with blood pressure 86/61.  EKG features sinus tachycardia with rate 123 and ST abnormalities that appear similar to priors.  Chest x-ray demonstrates interval worsening in bilateral pulmonary airspace disease.  Chemistry panel notable for BUN of 54, up from 26 yesterday.  Albumin is only 1.9.  CBC features a new leukocytosis to 32,200 and anemia with hemoglobin 9.0, down from 11.5 yesterday.  Initial lactic acid is 3.7.  Patient was given 2 L of lactated Ringer's, cefepime, azithromycin,  and vancomycin in the ED.  Is also treated with albuterol in the emergency department, blood and urine cultures were ordered.  COVID-19 was tested in the ED. It was positive, as it would be expected to be. The patient initially tested positive on 04/09/2019. So, he is outside the 21 day period when he would require isolation.  [facility] were consulted to admit the patient for further evaluation and treatment.  The patient is receiving IV steroids which are being tapered down, nebulizer treatments, and rocephin and azithromycin. He received IV fluids for correction of his acute renal insufficiency. He is receiving supplemental O2. Nutrition has been consulted for evaluation and treatment of his malnourished status. His oxygen requirements remain high.  Due to worsening cough, increasing oxygen requirements and 1/4 blood cultures + for GPC in clusters I have changed the patient from Rocephin and azithromycin to Zosyn. Urine culture has grown out e. Coli.  Consultants  . None  Procedures  . None  Antibiotics   Anti-infectives (From admission, onward)   Start     Dose/Rate Route Frequency Ordered Stop   05/05/19 1200  piperacillin-tazobactam (ZOSYN) IVPB 3.375 g     3.375 g 100 mL/hr over 30 Minutes Intravenous Every 6 hours 05/05/19 1100     05/04/19 1715  azithromycin (ZITHROMAX) 500 mg in sodium chloride 0.9 % 250 mL IVPB  Status:  Discontinued     500 mg 250 mL/hr over 60 Minutes Intravenous Every 24 hours 05/04/19 1030 05/05/19 1100   05/04/19 1715  cefTRIAXone (ROCEPHIN) 2 g in sodium chloride 0.9 % 100 mL IVPB  Status:  Discontinued     2 g 200 mL/hr  over 30 Minutes Intravenous Every 24 hours 05/04/19 1030 05/05/19 1100   05/03/19 1715  cefTRIAXone (ROCEPHIN) 2 g in sodium chloride 0.9 % 100 mL IVPB  Status:  Discontinued     2 g 200 mL/hr over 30 Minutes Intravenous Every 24 hours 05/02/19 2024 05/04/19 1030   05/03/19 1715  azithromycin (ZITHROMAX) 500 mg in sodium  chloride 0.9 % 250 mL IVPB  Status:  Discontinued     500 mg 250 mL/hr over 60 Minutes Intravenous Every 24 hours 05/02/19 2024 05/04/19 1030   05/03/19 0600  vancomycin (VANCOREADY) IVPB 750 mg/150 mL  Status:  Discontinued     750 mg 150 mL/hr over 60 Minutes Intravenous Every 12 hours 05/02/19 1735 05/02/19 1933   05/02/19 1715  vancomycin (VANCOCIN) IVPB 1000 mg/200 mL premix     1,000 mg 200 mL/hr over 60 Minutes Intravenous  Once 05/02/19 1705 05/02/19 1938   05/02/19 1715  ceFEPIme (MAXIPIME) 2 g in sodium chloride 0.9 % 100 mL IVPB     2 g 200 mL/hr over 30 Minutes Intravenous  Once 05/02/19 1705 05/02/19 1938   05/02/19 1715  azithromycin (ZITHROMAX) 500 mg in sodium chloride 0.9 % 250 mL IVPB     500 mg 250 mL/hr over 60 Minutes Intravenous  Once 05/02/19 1705 05/02/19 1858     Subjective  The patient is resting comfortably. He has a coarse cough today. O2 requirements up to 7 liters.  Objective   Vitals:  Vitals:   05/05/19 0601 05/05/19 0809  BP: (!) 121/59 125/80  Pulse: 79 92  Resp: 18 18  Temp: 98 F (36.7 C) 98 F (36.7 C)  SpO2: 93% (!) 88%   Exam:  Constitutional:  . The patient is awake, alert, and oriented x 3. No acute distress. Respiratory:  . Positive for accessory muscle use and tachypnea.  Marland Kitchen Positive for rales at right base and rhonchi. No wheezes. . No tactile fremitus Cardiovascular:  . Regular rate and rhythm . No murmurs, ectopy, or gallups. . No lateral PMI. No thrills. Abdomen:  . Abdomen is soft, non-tender, non-distended . No hernias, masses, or organomegaly . Normoactive bowel sounds.  Musculoskeletal:  . No cyanosis, clubbing, or edema Skin:  . No rashes, lesions, ulcers . palpation of skin: no induration or nodules Neurologic:  . CN 2-12 intact . Sensation all 4 extremities intact Psychiatric:  . Mental status o Mood, affect appropriate o Orientation to person, place, time  . judgment and insight appear intact  I have  personally reviewed the following:   Today's Data  . Vitals, CBC, CMP, Iron studies . 1/4 blood cultures positive for  . Urine culture positive for E. coli  Imaging  . CTA chest . CXR  Scheduled Meds: . divalproex  500 mg Oral QHS  . feeding supplement (ENSURE ENLIVE)  237 mL Oral BID BM  . guaiFENesin  1,200 mg Oral BID  . methylPREDNISolone (SOLU-MEDROL) injection  60 mg Intravenous Q6H  . multivitamin with minerals  1 tablet Oral Daily  . sertraline  25 mg Oral Daily  . sodium chloride flush  3 mL Intravenous Q12H  . sodium chloride flush  3 mL Intravenous Q12H  . umeclidinium bromide  1 puff Inhalation Daily   Continuous Infusions: . sodium chloride    . piperacillin-tazobactam      Principal Problem:   Severe sepsis Paul Suarez) Active Problems:   Multifocal pneumonia   CAD (coronary artery disease)   Essential hypertension  COPD with acute exacerbation (HCC)   Major depression, recurrent ([place])   Seizure disorder (Paul Suarez)   Acute on chronic respiratory failure with hypoxia (HCC)   AKI (acute kidney injury) (Paul Suarez)   Acute blood loss anemia   LOS: 3 days   A & P  Sepsis secondary to PNA : Hypotension, tachycardia, leukocytosis, lactic acidosis, AKI, and increased oxygen requirements. CXR with worsening bilateral airspace disease. Blood cultures x 2 were drawn in the ED.  Urinalysis is positive for UTI. Urine culture has grown out E. Coli. Sensitivities are pending. They patient has been started on Rocephin and azithromycin. Blood pressures are improved, but the patient remains tachycardic. Leukocytosis is decreased as is lactic acid. Oxygen requirements are increased. Today lung sounds were very coarse and oxygen requirements are increased. CXR performed on 05/04/2019 demonstrated worsening consolidate at the right base. The patient has increasing oxygen requirements. Blood cultures 1/4 have grown out GPC in clusters and Urine culture has grown out E. Coli. I have stopped rocephin  and azithromycin. I have started zosyn. Will add guaifenesin, incentive spirometry, and flutter valve.   Acute kidney injury: Creatinine is down to 0.70 today. SCr was 1.09 in ED, up from 0.55 prior to admission.  The creatinine was elevated due to prerenal azotemia in setting of hypotension. The patient was fluid-resuscitated in ED. Will monitor creatinine, electrolytes, and volume status. Avoid nephrotoxic substances and hypotension. Antihypertensives are held. Continue IV fluids.   Epistaxis and/or hemoptysis: Anemia has dropped to 7.5 from 11.5 on 03/31/2019. Epistaxis has stopped. Monitor and transfuse for hemoglobin less than 7.0. Platelets are normal, but antiplatelet agents are held.  Iron studies reveal severe iron deficiency. I will supplement. Monitor anemia.   COPD with acute exacerbation: Exacerbation due to pneumonia. Continue albuterol, Spiriva, and systemic steroids with IV antibiotics. Sputum cultures pending.  CAD: Noted. Appears stable. No anginal complaints. Antiplatelet agents are pending.    Seizure disorder: Continue Depakote. Monitor.     Recent COVID-19 infection: Patient was reportedly diagnosed with COVID on 04/09/19 as documented in progress note from his nursing home that same day. I confirmed with our Infection Prevention dept that since it has been >21 days but <90 days. So, despite positive test result from the ED, he should not be on special precautions or in a COVID unit. The Infection Prevention dept stated that they would also put a note in his chart to this effect. The current respiratory sxs are suspected to be secondary to a bacterial PNA which may be sequela of the recent COVID.   Malnutrition: Moderate and likely chronic. Pt is receiving nutritional supplements. I appreciate dietician's assistance.  I have seen and examined this patient myself. I have spent 32 minutes in his evaluation and care.    DVT prophylaxis: SCD's  Code Status: Full, confirmed with  patient  Family Communication: Discussed with patient  Disposition Plan: Will likely require SNF on discharge once sepsis resolved, respiratory status improved and stable, and bleeding/anemia stabilized.   Gustave Lindeman, DO [facility] Direct contact: see [email or web address]  7PM-7AM contact night coverage as above 05/05/2019, 2:38 PM  LOS: 1 day

## 2019-05-06 ENCOUNTER — Encounter (HOSPITAL_COMMUNITY): Payer: Self-pay | Admitting: Family Medicine

## 2019-05-06 ENCOUNTER — Inpatient Hospital Stay (HOSPITAL_COMMUNITY): Payer: Medicare Other

## 2019-05-06 DIAGNOSIS — I10 Essential (primary) hypertension: Secondary | ICD-10-CM

## 2019-05-06 DIAGNOSIS — R7881 Bacteremia: Secondary | ICD-10-CM

## 2019-05-06 DIAGNOSIS — J9611 Chronic respiratory failure with hypoxia: Secondary | ICD-10-CM

## 2019-05-06 DIAGNOSIS — G40909 Epilepsy, unspecified, not intractable, without status epilepticus: Secondary | ICD-10-CM

## 2019-05-06 DIAGNOSIS — R413 Other amnesia: Secondary | ICD-10-CM

## 2019-05-06 DIAGNOSIS — Z8616 Personal history of COVID-19: Secondary | ICD-10-CM

## 2019-05-06 DIAGNOSIS — J432 Centrilobular emphysema: Secondary | ICD-10-CM

## 2019-05-06 DIAGNOSIS — Z87891 Personal history of nicotine dependence: Secondary | ICD-10-CM

## 2019-05-06 DIAGNOSIS — E43 Unspecified severe protein-calorie malnutrition: Secondary | ICD-10-CM

## 2019-05-06 DIAGNOSIS — R040 Epistaxis: Secondary | ICD-10-CM

## 2019-05-06 DIAGNOSIS — R410 Disorientation, unspecified: Secondary | ICD-10-CM

## 2019-05-06 DIAGNOSIS — Y95 Nosocomial condition: Secondary | ICD-10-CM

## 2019-05-06 DIAGNOSIS — B9561 Methicillin susceptible Staphylococcus aureus infection as the cause of diseases classified elsewhere: Secondary | ICD-10-CM

## 2019-05-06 DIAGNOSIS — Z89512 Acquired absence of left leg below knee: Secondary | ICD-10-CM

## 2019-05-06 DIAGNOSIS — J439 Emphysema, unspecified: Secondary | ICD-10-CM

## 2019-05-06 DIAGNOSIS — I2584 Coronary atherosclerosis due to calcified coronary lesion: Secondary | ICD-10-CM

## 2019-05-06 DIAGNOSIS — U071 COVID-19: Secondary | ICD-10-CM

## 2019-05-06 DIAGNOSIS — J189 Pneumonia, unspecified organism: Secondary | ICD-10-CM

## 2019-05-06 DIAGNOSIS — J9601 Acute respiratory failure with hypoxia: Secondary | ICD-10-CM

## 2019-05-06 DIAGNOSIS — G9340 Encephalopathy, unspecified: Secondary | ICD-10-CM

## 2019-05-06 DIAGNOSIS — R4789 Other speech disturbances: Secondary | ICD-10-CM

## 2019-05-06 DIAGNOSIS — I2510 Atherosclerotic heart disease of native coronary artery without angina pectoris: Secondary | ICD-10-CM

## 2019-05-06 HISTORY — DX: Methicillin susceptible Staphylococcus aureus infection as the cause of diseases classified elsewhere: B95.61

## 2019-05-06 HISTORY — DX: Bacteremia: R78.81

## 2019-05-06 LAB — CBC WITH DIFFERENTIAL/PLATELET
Abs Immature Granulocytes: 0.33 10*3/uL — ABNORMAL HIGH (ref 0.00–0.07)
Basophils Absolute: 0 10*3/uL (ref 0.0–0.1)
Basophils Relative: 0 %
Eosinophils Absolute: 0 10*3/uL (ref 0.0–0.5)
Eosinophils Relative: 0 %
HCT: 26 % — ABNORMAL LOW (ref 39.0–52.0)
Hemoglobin: 8.3 g/dL — ABNORMAL LOW (ref 13.0–17.0)
Immature Granulocytes: 2 %
Lymphocytes Relative: 9 %
Lymphs Abs: 1.5 10*3/uL (ref 0.7–4.0)
MCH: 30.5 pg (ref 26.0–34.0)
MCHC: 31.9 g/dL (ref 30.0–36.0)
MCV: 95.6 fL (ref 80.0–100.0)
Monocytes Absolute: 0.4 10*3/uL (ref 0.1–1.0)
Monocytes Relative: 3 %
Neutro Abs: 13.9 10*3/uL — ABNORMAL HIGH (ref 1.7–7.7)
Neutrophils Relative %: 86 %
Platelets: 254 10*3/uL (ref 150–400)
RBC: 2.72 MIL/uL — ABNORMAL LOW (ref 4.22–5.81)
RDW: 15.4 % (ref 11.5–15.5)
WBC: 16.2 10*3/uL — ABNORMAL HIGH (ref 4.0–10.5)
nRBC: 0.2 % (ref 0.0–0.2)

## 2019-05-06 LAB — BPAM RBC
Blood Product Expiration Date: 202103192359
Blood Product Expiration Date: 202103192359
ISSUE DATE / TIME: 202102190048
Unit Type and Rh: 5100
Unit Type and Rh: 5100

## 2019-05-06 LAB — TYPE AND SCREEN
ABO/RH(D): A POS
Antibody Screen: POSITIVE
Unit division: 0
Unit division: 0

## 2019-05-06 LAB — BLOOD GAS, ARTERIAL
Acid-Base Excess: 2.6 mmol/L — ABNORMAL HIGH (ref 0.0–2.0)
Bicarbonate: 26.3 mmol/L (ref 20.0–28.0)
FIO2: 100
O2 Saturation: 94.8 %
Patient temperature: 37
pCO2 arterial: 37.8 mmHg (ref 32.0–48.0)
pH, Arterial: 7.456 — ABNORMAL HIGH (ref 7.350–7.450)
pO2, Arterial: 73.6 mmHg — ABNORMAL LOW (ref 83.0–108.0)

## 2019-05-06 LAB — FOLATE RBC
Folate, Hemolysate: 368 ng/mL
Folate, RBC: 1502 ng/mL (ref >498)
Hematocrit: 24.5 % — ABNORMAL LOW (ref 37.5–51.0)

## 2019-05-06 LAB — BASIC METABOLIC PANEL
Anion gap: 10 (ref 5–15)
BUN: 26 mg/dL — ABNORMAL HIGH (ref 8–23)
CO2: 25 mmol/L (ref 22–32)
Calcium: 8.5 mg/dL — ABNORMAL LOW (ref 8.9–10.3)
Chloride: 104 mmol/L (ref 98–111)
Creatinine, Ser: 0.66 mg/dL (ref 0.61–1.24)
GFR calc Af Amer: >60 mL/min (ref >60)
GFR calc non Af Amer: >60 mL/min (ref >60)
Glucose, Bld: 122 mg/dL — ABNORMAL HIGH (ref 70–99)
Potassium: 4.6 mmol/L (ref 3.5–5.1)
Sodium: 139 mmol/L (ref 135–145)

## 2019-05-06 MED ORDER — CEFAZOLIN SODIUM-DEXTROSE 2-4 GM/100ML-% IV SOLN
2.0000 g | Freq: Three times a day (TID) | INTRAVENOUS | Status: DC
  Filled 2019-05-06 (×2): qty 100

## 2019-05-06 MED ORDER — VANCOMYCIN HCL 750 MG/150ML IV SOLN: 750.0000 mg | Freq: Two times a day (BID) | INTRAVENOUS | Status: DC

## 2019-05-06 MED ORDER — VANCOMYCIN HCL 1250 MG/250ML IV SOLN
1250.0000 mg | Freq: Once | INTRAVENOUS | Status: DC
  Filled 2019-05-06: qty 250

## 2019-05-06 MED ORDER — VANCOMYCIN HCL 1250 MG/250ML IV SOLN
1250.0000 mg | Freq: Once | INTRAVENOUS | Status: AC
  Administered 2019-05-06: 1250 mg via INTRAVENOUS
  Filled 2019-05-06: qty 250

## 2019-05-06 MED ORDER — CEFAZOLIN SODIUM-DEXTROSE 2-4 GM/100ML-% IV SOLN
2.0000 g | Freq: Three times a day (TID) | INTRAVENOUS | Status: DC
  Administered 2019-05-06 – 2019-05-08 (×6): 2 g via INTRAVENOUS
  Filled 2019-05-06 (×9): qty 100

## 2019-05-06 MED ORDER — FUROSEMIDE 10 MG/ML IJ SOLN
40.0000 mg | Freq: Once | INTRAMUSCULAR | Status: AC
  Administered 2019-05-06: 40 mg via INTRAVENOUS
  Filled 2019-05-06: qty 4

## 2019-05-06 MED ORDER — VANCOMYCIN HCL 750 MG/150ML IV SOLN
750.0000 mg | Freq: Two times a day (BID) | INTRAVENOUS | Status: DC
  Administered 2019-05-07 (×2): 750 mg via INTRAVENOUS
  Filled 2019-05-06 (×2): qty 150

## 2019-05-06 NOTE — Consult Note (Signed)
Date of Admission:  05/02/2019          Reason for Consult: Staphylococcus aureus bacteremia    Referring Provider: CHAMP   Assessment:   1. Staphylococcus aureus bacteremia likely due to  2. Multifocal Pneumonia 3. Epistaxis 4. Recent COVID 5. COPD 6. 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 Start vancomycin 2. DC Zosyn and switch to cefazolin 3. Repeat blood cultures 4. 2D echocardiogram   Principal Problem:   Staphylococcus aureus bacteremia Active Problems:   Multifocal pneumonia   CAD (coronary artery disease)   Essential hypertension   COPD with acute exacerbation (HCC)   Major depression, recurrent (HCC)   Seizure disorder (HCC)   Severe sepsis (HCC)   Acute on chronic respiratory failure with hypoxia (HCC)   AKI (acute kidney injury) ([place])   Acute blood loss anemia   Scheduled Meds: . divalproex  500 mg Oral QHS  . feeding supplement (ENSURE ENLIVE)  237 mL Oral BID BM  . guaiFENesin  1,200 mg Oral BID  . methylPREDNISolone (SOLU-MEDROL) injection  60 mg Intravenous Q6H  . multivitamin with minerals  1 tablet Oral Daily  . sertraline  25 mg Oral Daily  . sodium chloride flush  3 mL Intravenous Q12H  . sodium chloride flush  3 mL Intravenous Q12H  . umeclidinium bromide  1 puff Inhalation Daily   Continuous Infusions: . sodium chloride    . piperacillin-tazobactam (ZOSYN)  IV 3.375 g (05/06/19 0514)  . vancomycin    . vancomycin     PRN Meds:.sodium chloride, acetaminophen **OR** acetaminophen, albuterol, Melatonin, ondansetron **OR** ondansetron (ZOFRAN) IV, sodium chloride flush  HPI: Paul Suarez is a 79 y.o. male with history of COPD with emphysema and chronic hypoxic respiratory failure, seizure disorder hypertension coronary artery disease who had a recent Covid infection and now was brought in from a skilled nursing facility due to problems with epistaxis.  He also was having episodes of possible  hemoptysis and had a CT angiogram performed which was negative for pulmonary embolism but showed increased patchy bilateral airspace consistent disease consistent with multifocal pneumonia.  He then had worsening epistaxis and started on Afrin.  Bleeding stopped but he was tachycardic and hypotensive requiring increased oxygen.  He was admitted to the hospitalist service and started on ceftriaxone and azithromycin then changed to Zosyn yesterday.  Blood cultures were drawn on admission and now are growing staph coccus aureus in one of the 2 culture sites that were taken.    Review of Systems: Review of Systems  Unable to perform ROS: Mental acuity    Past Medical History:  Diagnosis Date  . CAD (coronary artery disease)   . COPD (chronic obstructive pulmonary disease) ([place])   . Depression   . Essential hypertension   . Seizure disorder ([place])   . Staphylococcus aureus bacteremia 05/06/2019    Social History   Tobacco Use  . Smoking status: Former Smoker    Years: 20.00  . Smokeless tobacco: Never Used  Substance Use Topics  . Alcohol use: Not Currently  . Drug use: Never    No family history on file. No Known Allergies  OBJECTIVE: Blood pressure 124/75, pulse 95, temperature (!) 97.4 F (36.3 C), temperature source Oral, resp. rate 18, height 5\' 11"  (1.803 m), weight 58.4 kg, SpO2 94 %.  Physical Exam Vitals reviewed.  Constitutional:      Appearance: He is ill-appearing.  HENT:  Head: Normocephalic and atraumatic.  Eyes:     General: No scleral icterus.       Right eye: No discharge.        Left eye: No discharge.  Cardiovascular:     Heart sounds: Normal heart sounds. No murmur. No friction rub. No gallop.   Pulmonary:     Effort: No respiratory distress.     Breath sounds: Rhonchi present. No wheezing.  Abdominal:     General: Bowel sounds are normal. There is no distension.     Palpations: There is no mass.  Neurological:     General: No focal deficit  present.     Mental Status: He is alert. He is disoriented.  Psychiatric:        Mood and Affect: Affect normal.        Speech: Speech is delayed.        Behavior: Behavior is cooperative.        Cognition and Memory: Cognition is impaired. Memory is impaired.   BKA site is clean no ulcers   Lab Results Lab Results  Component Value Date   WBC 16.2 (H) 05/06/2019   HGB 8.3 (L) 05/06/2019   HCT 26.0 (L) 05/06/2019   MCV 95.6 05/06/2019   PLT 254 05/06/2019    Lab Results  Component Value Date   CREATININE 0.66 05/06/2019   BUN 26 (H) 05/06/2019   NA 139 05/06/2019   K 4.6 05/06/2019   CL 104 05/06/2019   CO2 25 05/06/2019    Lab Results  Component Value Date   ALT 14 05/03/2019   AST 16 05/03/2019   ALKPHOS 42 05/03/2019   BILITOT 0.5 05/03/2019     Microbiology: Recent Results (from the past 240 hour(s))  Blood Culture (routine x 2)     Status: Abnormal (Preliminary result)   Collection Time: 05/02/19  5:06 PM   Specimen: BLOOD  Result Value Ref Range Status   Specimen Description BLOOD BLOOD RIGHT FOREARM  Final   Special Requests   Final    BOTTLES DRAWN AEROBIC AND ANAEROBIC Blood Culture adequate volume   Culture  Setup Time   Final    ANAEROBIC BOTTLE ONLY GRAM POSITIVE COCCI IN CLUSTERS CRITICAL RESULT CALLED TO, READ BACK BY AND VERIFIED WITH: L SEAY PHARMD 05/05/19 0318 JDW    Culture (A)  Final    STAPHYLOCOCCUS AUREUS SUSCEPTIBILITIES TO FOLLOW Performed at [facility], [address] [place], [place] [address]    Report Status PENDING  Incomplete  Blood Culture (routine x 2)     Status: None (Preliminary result)   Collection Time: 05/02/19  5:08 PM   Specimen: BLOOD  Result Value Ref Range Status   Specimen Description BLOOD BLOOD LEFT FOREARM  Final   Special Requests   Final    BOTTLES DRAWN AEROBIC AND ANAEROBIC Blood Culture adequate volume   Culture NO GROWTH 3 DAYS  Final   Report Status PENDING  Incomplete  Urine culture      Status: Abnormal   Collection Time: 05/02/19  5:08 PM   Specimen: In/Out Cath Urine  Result Value Ref Range Status   Specimen Description IN/OUT CATH URINE  Final   Special Requests   Final    NONE Performed at [facility], [address], [place], [place] [address]    Culture (A)  Final    5,000 COLONIES/mL ESCHERICHIA COLI Confirmed Extended Spectrum Beta-Lactamase Producer (ESBL).  In bloodstream infections from ESBL  organisms, carbapenems are preferred over piperacillin/tazobactam. They are shown to have a lower risk of mortality.    Report Status 05/04/2019 FINAL  Final   Organism ID, Bacteria ESCHERICHIA COLI (A)  Final      Susceptibility   Escherichia coli - MIC*    AMPICILLIN >=32 RESISTANT Resistant     CEFAZOLIN >=64 RESISTANT Resistant     CEFTRIAXONE >=64 RESISTANT Resistant     CIPROFLOXACIN >=4 RESISTANT Resistant     GENTAMICIN <=1 SENSITIVE Sensitive     IMIPENEM <=0.25 SENSITIVE Sensitive     NITROFURANTOIN <=16 SENSITIVE Sensitive     TRIMETH/SULFA <=20 SENSITIVE Sensitive     AMPICILLIN/SULBACTAM 16 INTERMEDIATE Intermediate     PIP/TAZO <=4 SENSITIVE Sensitive     * 5,000 COLONIES/mL ESCHERICHIA COLI  SARS CORONAVIRUS 2 (TAT 6-24 HRS) Nasopharyngeal Nasopharyngeal Swab     Status: Abnormal   Collection Time: 05/02/19  6:26 PM   Specimen: Nasopharyngeal Swab  Result Value Ref Range Status   SARS Coronavirus 2 POSITIVE (A) NEGATIVE Final    Comment: RESULT CALLED TO, READ BACK BY AND VERIFIED WITH: H.MASHBURN RN [id number] 05/02/19 MCCORMICK K (NOTE) SARS-CoV-2 target nucleic acids are DETECTED. The SARS-CoV-2 RNA is generally detectable in upper and lower respiratory specimens during the acute phase of infection. Positive results are indicative of the presence of SARS-CoV-2 RNA. Clinical correlation with patient history and other diagnostic information is  necessary to determine patient infection status. Positive results do not rule out bacterial  infection or co-infection with other viruses.  The expected result is Negative. Fact Sheet for Patients: HairSlick.no Fact Sheet for Healthcare Providers: quierodirigir.com This test is not yet approved or cleared by the [place] FDA and  has been authorized for detection and/or diagnosis of SARS-CoV-2 by FDA under an Emergency Use Authorization (EUA). This EUA will remain  in effect (meaning this test can be used) for  the duration of the COVID-19 declaration under Section 564(b)(1) of the Act, 21 U.S.C. section 360bbb-3(b)(1), unless the authorization is terminated or revoked sooner. Performed at [facility], [address] [place], [place] [address]   MRSA PCR Screening     Status: None   Collection Time: 05/02/19  8:30 PM   Specimen: Nasopharyngeal  Result Value Ref Range Status   MRSA by PCR NEGATIVE NEGATIVE Final    Comment:        The GeneXpert MRSA Assay (FDA approved for NASAL specimens only), is one component of a comprehensive MRSA colonization surveillance program. It is not intended to diagnose MRSA infection nor to guide or monitor treatment for MRSA infections. Performed at [facility], [address], [place], [place] [address]   MRSA PCR Screening     Status: None   Collection Time: 05/05/19 11:06 AM   Specimen: Nasopharyngeal  Result Value Ref Range Status   MRSA by PCR NEGATIVE NEGATIVE Final    Comment:        The GeneXpert MRSA Assay (FDA approved for NASAL specimens only), is one component of a comprehensive MRSA colonization surveillance program. It is not intended to diagnose MRSA infection nor to guide or monitor treatment for MRSA infections. Performed at [facility], [address] [place], [place] [address]     Acey Lav, MD [facility] [phone] pager  05/06/2019, 11:32 AM

## 2019-05-06 NOTE — Consult Note (Signed)
NAME:  Paul Suarez, MRN:  [id number], DOB:  [birth year], LOS: 4 ADMISSION DATE:  05/02/2019, CONSULTATION DATE:  05/06/2019  REFERRING MD:  Karie Kirks, DO, CHIEF COMPLAINT:  Respiratory failure  History of present illness   Paul Suarez is a 79 y.o. gentleman with history of CAD and PVD s/p BKA, Severe centrilobular emphysema on home oxygen 2LNC, who lives in a SNF.He was diagnosed with COVID 19 pneumonia. Has a history of recurrent resistant ESBL producing organisms. Recent outpatient anitbiotics courses for concern for post-covid bacterial pneumonia. Having epistaxis which was refractory and prompted ED visit. He was more hypoxemic on [facility] on presentation and started on CAP coverage with ceftriaxone/azithromycin and steroids for COPD exacerbation. Progressively worsening hypoxemia over the last 3 days. This afternoon possible acute aspiration event and now on more oxygen. Also found to have blood cultures positive for Staph aureus - sensitivities pending.  Currently on Vancomycin and Cefazolin, ID is following. PCCM being consulted to evaluate for worsening respiratory failure.   This afternoon he is alert and oriented to self and place but not to time or situation. He is very weak and denies any complaints. He is hallucinating things in the room next to him.   Past Medical History  He,  has a past medical history of CAD (coronary artery disease), COPD (chronic obstructive pulmonary disease) ([place]), Depression, Essential hypertension, Seizure disorder ([place]), and Staphylococcus aureus bacteremia (05/06/2019).   Consults:  PCCM  Procedures:   Significant Diagnostic Tests:  CT Chest Angio 2/16 demonstrates Severe centrilobular emphysema, multifocal consolidation concerning for pneumonia  Micro Data:  2/17 Blood cultures  2 out of 2 positive for staph aureus, sensitivities pending. 2/17 UA with ESBL producing E. Coli 2/17 COVID 19 positive (expected, patient with known infection greater than 30 days  but less than 90 days.) Antimicrobials:  Vancomycin 2/21 >> Cefazolin 2/21 >> Ceftriaxone2/18>>2/19 Azithromycin 2/18>>2/19 Piperacillin-Tazobactam 2/20 >>2/21 Objective   Blood pressure 117/72, pulse 94, temperature 98 F (36.7 C), temperature source Oral, resp. rate (!) 36, height 5\' 11"  (1.803 m), weight 58.4 kg, SpO2 90 %.        Intake/Output Summary (Last 24 hours) at 05/06/2019 1517 Last data filed at 05/06/2019 1500 Gross per 24 hour  Intake 250.05 ml  Output 575 ml  Net -324.95 ml   Filed Weights   05/04/19 0440 05/05/19 0601 05/06/19 0434  Weight: 61 kg 60.8 kg 58.4 kg    Examination: General: elderly man man in no respiratory distress, on nasal cannula O2 sats 89% on 7L HENT: dry mucus membranes, no blood oropharynx or epistaxis Lungs: diminished bilaterally Cardiovascular: tachycardic, regular Abdomen: scaphoid, soft, nontender Extremities: thin, left BKA Neuro: normal speech but does not consistently follow commands. Profoundly weak and unable to sit up in bed.  MSK: no rashes, skin is pale Lines/Tubes: PIV   Assessment & Plan:  Acute on Chronic Hypoxemic Respiratory Failure Healthcare Associated Pneumonia Severe Centrilobular Emphysema MRSA Bacteremia ESBL UTI Severe Protein Calorie Malnutrition Body mass index is 17.96 kg/m. CAD and PAD s/p BKA Recent COVID 19 infection Seizure disorder - on depakote  The patient was on nonrebreather, and worsening respiratory failure.  Has improved down to 7 LPM satting 89%.  This was after some nasotracheal suctioning.  I recommend continuation of antibiotics as recommended by infectious disease team.  Oxygen support for goal saturations over 88%.  Overall I think Mr. Rickel is having complications from his progressive chronic illnesses including coronary artery disease, centrilobular emphysema on home oxygen,  and severe deconditioning.  He is also having complications after having COVID-19 pneumonia.  I discussed these  findings with his 2 daughters Corrie Dandy and [employer].  He would not be a good candidate for invasive mechanical ventilation, or CPR.  They are in agreement with this.  I recommend bronchodilators, speech-language pathology consultation for aspiration evaluation, antibiotics, NT suctioning. In the event that he does not improve with these measures, should be transition to comfort care.  Discussed this with Dr. Gerri Lins.  Patient hesitate to contact PCCM team if we can be of further assistance.  Durel Salts, MD Pulmonary and Critical Care Medicine [facility] Pager: [phone] [phone]    Labs   CBC: Recent Labs  Lab 05/01/19 0000 05/01/19 0000 05/01/19 1617 05/01/19 1617 05/02/19 1647 05/02/19 1647 05/02/19 2350 05/02/19 2350 05/03/19 1058 05/03/19 1058 05/03/19 2257 05/04/19 0434 05/04/19 0742 05/04/19 0852 05/06/19 0359  WBC 9.1  --  7.4   < > 32.2*   < > 23.5*  --  19.1*  --  15.8* 15.7*  --   --  16.2*  NEUTROABS 6  --  6.4  --  31.9*  --   --   --   --   --   --  13.6*  --   --  13.9*  HGB 10.3*   < > 11.5*   < > 9.0*   < > 7.7*   < > 7.2*  --  6.4* 7.6* 7.5*  --  8.3*  HCT 31*   < > 36.8*   < > 28.9*   < > 24.0*   < > 22.0*   < > 19.9* 22.9* 22.8* 24.5* 26.0*  MCV  --   --  93.2   < > 97.3   < > 94.1  --  92.4  --  93.9 92.0  --   --  95.6  PLT 335   < > 376   < > 327   < > 250  --  242  --  218 220  --   --  254   < > = values in this interval not displayed.    Basic Metabolic Panel: Recent Labs  Lab 05/01/19 1617 05/02/19 1647 05/03/19 0218 05/04/19 0434 05/06/19 0359  NA 141 139 141 140 139  K 4.8 4.9 4.8 4.1 4.6  CL 106 105 107 107 104  CO2 26 22 26 23 25   GLUCOSE 114* 181* 138* 152* 122*  BUN 26* 54* 42* 34* 26*  CREATININE 0.55* 1.09 0.76 0.70 0.66  CALCIUM 8.8* 8.4* 8.6* 8.5* 8.5*   GFR: Estimated Creatinine Clearance: 62.9 mL/min (by C-G formula based on SCr of 0.66 mg/dL). Recent Labs  Lab 05/02/19 1647 05/02/19 1647  05/02/19 1911 05/02/19 2350 05/02/19 2350 05/03/19 0218 05/03/19 1058 05/03/19 2257 05/04/19 0434 05/06/19 0359  WBC 32.2*   < >  --  23.5*   < >  --  19.1* 15.8* 15.7* 16.2*  LATICACIDVEN 3.7*  --  2.3* 1.8  --  1.8  --   --   --   --    < > = values in this interval not displayed.    Liver Function Tests: Recent Labs  Lab 05/01/19 1617 05/02/19 1647 05/03/19 0218  AST 15 18 16   ALT 14 12 14   ALKPHOS 56 44 42  BILITOT 0.6 0.7 0.5  PROT 7.4 5.5* 5.6*  ALBUMIN 2.6* 1.9* 2.1*   No results for input(s): LIPASE, AMYLASE in the last 168  hours. No results for input(s): AMMONIA in the last 168 hours.  ABG    Component Value Date/Time   PHART 7.456 (H) 05/06/2019 1330   PCO2ART 37.8 05/06/2019 1330   PO2ART 73.6 (L) 05/06/2019 1330   HCO3 26.3 05/06/2019 1330   O2SAT 94.8 05/06/2019 1330     Coagulation Profile: Recent Labs  Lab 05/01/19 1617 05/02/19 1647  INR 1.2 1.3*    Cardiac Enzymes: No results for input(s): CKTOTAL, CKMB, CKMBINDEX, TROPONINI in the last 168 hours.  HbA1C: No results found for: HGBA1C  CBG: No results for input(s): GLUCAP in the last 168 hours.  Review of Systems:   Unable to obtain due to mental status Denies any pain.   Surgical History    Past Surgical History:  Procedure Laterality Date  . BELOW KNEE LEG AMPUTATION Left      Social History   reports that he has quit smoking. He quit after 20.00 years of use. He has never used smokeless tobacco. He reports previous alcohol use. He reports that he does not use drugs.   Family History   His family history is not on file.   Allergies No Known Allergies   Home Medications  Prior to Admission medications   Medication Sig Start Date End Date Taking? Authorizing Provider  clopidogrel (PLAVIX) 75 MG tablet Take 1 tablet (75 mg total) by mouth daily. 02/12/19  Yes Medina-Vargas, Monina C, NP  divalproex (DEPAKOTE ER) 500 MG 24 hr tablet Take 1 tablet (500 mg total) by mouth at  bedtime. 02/12/19  Yes Medina-Vargas, Monina C, NP  docusate sodium (COLACE) 100 MG capsule Take 1 capsule (100 mg total) by mouth at bedtime. 02/12/19  Yes Medina-Vargas, Monina C, NP  loratadine (CLARITIN) 10 MG tablet Take 1 tablet (10 mg total) by mouth daily. 02/12/19  Yes Medina-Vargas, Monina C, NP  Nutritional Supplement LIQD Take 120 mLs by mouth 2 (two) times daily. MedPass   Yes [provider]  Nutritional Supplements (NUTRITIONAL SUPPLEMENT PO) Take 1 each by mouth daily. Magic Cup   Yes [provider]  OXYGEN Inhale 2 L into the lungs. O2 at 2L/min via [place] continuously (portable & stationary oncentrators)   Yes [provider]  sertraline (ZOLOFT) 50 MG tablet Take 25 mg by mouth daily.  04/27/19 05/02/19 Yes [provider]  tiotropium (SPIRIVA HANDIHALER) 18 MCG inhalation capsule Place 1 capsule (18 mcg total) into inhaler and inhale daily. 02/12/19  Yes Medina-Vargas, Monina C, NP  vitamin C (ASCORBIC ACID) 500 MG tablet Take 1 tablet (500 mg total) by mouth daily. 02/12/19  Yes Medina-Vargas, Monina C, NP  zinc sulfate 220 (50 Zn) MG capsule Take 220 mg by mouth daily.  04/19/19  Yes [provider]  albuterol (VENTOLIN HFA) 108 (90 Base) MCG/ACT inhaler Inhale 2 puffs into the lungs every 6 (six) hours as needed for shortness of breath (FOR Shortness of Breath  (S.O.B.)).    [provider]  cholecalciferol (VITAMIN D) 25 MCG (1000 UT) tablet Take 1 tablet (1,000 Units total) by mouth daily. 02/12/19   Medina-Vargas, Monina C, NP  dexamethasone (DECADRON) 2 MG tablet Take 2 mg by mouth daily.     [provider]  guaiFENesin (ROBITUSSIN) 100 MG/5ML liquid Take 200 mg by mouth 4 (four) times daily as needed for cough or congestion.  05/02/19 05/17/19  [provider]  ipratropium-albuterol (DUONEB) 0.5-2.5 (3) MG/3ML SOLN Take 3 mLs by nebulization 4 (four) times daily. For SOB and  Wheezing 04/28/19 05/01/19  [provider]  magnesium hydroxide (MILK OF MAGNESIA) 400 MG/5ML suspension If no BM in 3 days, give 30 cc Milk of Magnesium p.o. x 1 dose in 24 hours as needed (Do not use standing constipation orders for residents with renal failure CFR less than 30. Contact MD for orders) (Physician Order)    [provider]  Melatonin 3 MG TABS Take 3 mg by mouth at bedtime as needed (sleep).     [provider]  oxymetazoline (AFRIN) 0.05 % nasal spray Place 2 sprays into both nostrils 2 (two) times daily.    [provider]  polyethylene glycol powder (GLYCOLAX/MIRALAX) 17 GM/SCOOP powder Take 17 g by mouth daily. 02/12/19   Medina-Vargas, Monina C, NP     Critical care time   The patient is critically ill with multiple organ systems failure and requires high complexity decision making for assessment and support, frequent evaluation and titration of therapies, application of advanced monitoring technologies and extensive interpretation of multiple databases.   Critical Care Time devoted to patient care services described in this note is 38 minutes. This time reflects my personal involvement in patient care. This critical care time does not reflect separately billable procedures or procedure time, teaching time or supervisory time of PA/NP/Med student/Med Resident etc but could involve care discussion time.  Mickel Baas Pulmonary and Critical Care Medicine 05/06/2019 3:17 PM  Pager: [phone] After hours pager: [phone]

## 2019-05-06 NOTE — Progress Notes (Signed)
RT called to patient room due to patient having to be placed on a non-rebreather mask.  Upon arrival patient noted to have increased work of breathing, sats were reading 94% on NRB and HR noted to be 130s.  EKG being performed on arrival to room.  Increased flow on NRB due to noticing bag deflation on inhalation. Unable to perform bipap due to patient having bites of food periodically and per RN patient possibly aspirating.  ABG performed and stat CXR ordered.  RN to page MD and inform of current events.  Will also update rapid response RN.  Patient states that his breathing feels ok at this time.  Will continue to monitor.

## 2019-05-06 NOTE — Progress Notes (Signed)
Pharmacy Antibiotic Note  Paul Suarez is a 79 y.o. male admitted on 05/02/2019 with bacteremia.  Pharmacy has been consulted for Vancomycin and Cefazolin dosing.  Patient is currently afebrile with elevated WBC (16.2) while receiving IV steroids. Renal function is stable. CXR on 2/19 showed worsening L lower lobe opacity with improved R upper lobe opacity.  Plan: Start Vancomycin 1250 mg IV once, then 750 mg IV Q12 hrs  (Scr 0.8, Vd 0.72 L, est AUC 497.7) Start Cefazolin 2g IV Q8 hrs Monitor cultures and sensitivities, renal function, and clinical progression Check Vancomycin levels at steady state  Height: 5\' 11"  (180.3 cm) Weight: 128 lb 12 oz (58.4 kg) IBW/kg (Calculated) : 75.3  Temp (24hrs), Avg:97.7 F (36.5 C), Min:97.4 F (36.3 C), Max:98 F (36.7 C)  Recent Labs  Lab 05/01/19 1617 05/01/19 1617 05/02/19 1647 05/02/19 1647 05/02/19 1911 05/02/19 2350 05/03/19 0218 05/03/19 1058 05/03/19 2257 05/04/19 0434 05/06/19 0359  WBC 7.4   < > 32.2*   < >  --  23.5*  --  19.1* 15.8* 15.7* 16.2*  CREATININE 0.55*  --  1.09  --   --   --  0.76  --   --  0.70 0.66  LATICACIDVEN  --   --  3.7*  --  2.3* 1.8 1.8  --   --   --   --    < > = values in this interval not displayed.    Estimated Creatinine Clearance: 62.9 mL/min (by C-G formula based on SCr of 0.66 mg/dL).    No Known Allergies  Antimicrobials this admission: Vancomycin 2/17, 2/21 >> Cefepime 2/17 x1 Azithromycin 2/17 >> 2/20 Ceftriaxone 2/18 >> 2/20 Zosyn 2/20 >>2/21 Cefazolin 2/21 >>  Dose adjustments this admission: N/A  Microbiology results: 2/17 BCx: 1/4 Staph Aureus 2/17 UCx: ESBL Ecoli (likely colonization)  2/20 MRSA PCR: neg  3/20, PharmD PGY1 Pharmacy Resident Phone: [phone] 05/06/2019  10:10 AM  Please check [email or web address] for unit-specific pharmacy phone numbers.

## 2019-05-06 NOTE — Progress Notes (Addendum)
   Vital Signs MEWS/VS Documentation      05/06/2019 1330 05/06/2019 1345 05/06/2019 1400 05/06/2019 1430   MEWS Score:  5  6  6  6    MEWS Score Color:  Red  Red  Red  Red   Resp: 32  (!) 36  --  --   BP:  123/76  112/70  115/72  112/80   Temp: 96.8  98.6 F (37 C)  --  --   O2 Device:  Partial Rebreather Mask  Partial Rebreather Mask  Partial Rebreather Mask  Partial Rebreather Mask   Level of Consciousness:  Alert  Alert  Alert  Alert      Patient with increased WOB, low SpO2 on 7L [place]. Dr. and RT called to bedside. Placed on NRBM with SpO2 increasing to 90-93%. Audible rhonchi present, NT suctioned for large amount of tan, blood tinged sputum. EKG competed which confirmed ST in 120-130's. CXR obtained, ABG obtained. Patient made NPO with speech consult pending.Patient's WOB gradually improving, remains on NRBM.  Pulmonary consult placed. RR nurse to bedside to evaluate. Followed up with Dr. Theodis Sato- no plan to change any treatment at this time. Daughter updated and goals of care discussed per Dr. Theodis Sato and ICU team. Patient able to be weaned down to 9L HFNC at 1500- see flowsheets for detailed vital signs. Will continue to monitor.   Gerri Lins 05/06/2019,4:06 PM

## 2019-05-06 NOTE — Progress Notes (Signed)
PROGRESS NOTE  Anothony Bursch ATF:[id number] DOB: 1940-06-20 DOA: 05/02/2019 PCP: Pecola Lawless, MD  Brief History    Paul Suarez is a 79 y.o. male with medical history significant for emphysema with chronic hypoxic respiratory failure with a 2L O2 requirement at baseline, seizure disorder, hypertension, depression, coronary artery disease, and recent COVID-19 infection, now presenting from his SNF for evaluation of epistaxis.  Patient had been diagnosed with Covid approximately 3-4 weeks ago per report, had been treated with multiple rounds of antibiotics, was seen in the emergency department yesterday for possible hemoptysis and had CTA chest that was negative for large central PE but concerning for increased patchy bilateral airspace disease suggestive of a multifocal pneumonia.  He then developed epistaxis today at his nursing facility, was started on Afrin nasal spray, initially had difficulty controlling the bleeding, and EMS was called.  The bleeding had stopped, but the patient was noted to be tachycardic with low blood pressure and increased oxygen requirements, and was brought into the ED.  Patient denies any chest pain, reports that his cough and shortness of breath has been worse recently, denies any melena or hematochezia, and denies any abdominal pain.   ED Course: Upon arrival to the ED, patient is found to be afebrile, saturating mid 80s on 3 L/min of supplemental oxygen, tachypneic, tachycardic to 130, and with blood pressure 86/61.  EKG features sinus tachycardia with rate 123 and ST abnormalities that appear similar to priors.  Chest x-ray demonstrates interval worsening in bilateral pulmonary airspace disease.  Chemistry panel notable for BUN of 54, up from 26 yesterday.  Albumin is only 1.9.  CBC features a new leukocytosis to 32,200 and anemia with hemoglobin 9.0, down from 11.5 yesterday.  Initial lactic acid is 3.7.  Patient was given 2 L of lactated Ringer's, cefepime, azithromycin,  and vancomycin in the ED.  Is also treated with albuterol in the emergency department, blood and urine cultures were ordered.  COVID-19 was tested in the ED. It was positive, as it would be expected to be. The patient initially tested positive on 04/09/2019. So, he is outside the 21 day period when he would require isolation.  [facility] were consulted to admit the patient for further evaluation and treatment.  The patient is receiving IV steroids which are being tapered down, nebulizer treatments, and rocephin and azithromycin. He received IV fluids for correction of his acute renal insufficiency. He is receiving supplemental O2. Nutrition has been consulted for evaluation and treatment of his malnourished status. His oxygen requirements remain high.  Due to worsening cough, increasing oxygen requirements and 1/4 blood cultures + for GPC in clusters I have changed the patient from Rocephin and azithromycin to Zosyn. Urine culture has grown out e. Coli. Organism grown out of blood culture has been identified as staph aureus. Sensitivities are pending.  Antibiotics have been changed to Vancomycin and ancef per infectious disease.  Consultants  . None  Procedures  . None  Antibiotics   Anti-infectives (From admission, onward)   Start     Dose/Rate Route Frequency Ordered Stop   05/07/19 0200  vancomycin (VANCOREADY) IVPB 750 mg/150 mL     750 mg 150 mL/hr over 60 Minutes Intravenous Every 12 hours 05/06/19 1340     05/06/19 2300  vancomycin (VANCOREADY) IVPB 750 mg/150 mL  Status:  Discontinued     750 mg 150 mL/hr over 60 Minutes Intravenous Every 12 hours 05/06/19 1018 05/06/19 1340   05/06/19 1415  vancomycin (VANCOREADY)  IVPB 1250 mg/250 mL     1,250 mg 166.7 mL/hr over 90 Minutes Intravenous  Once 05/06/19 1340     05/06/19 1415  ceFAZolin (ANCEF) IVPB 2g/100 mL premix     2 g 200 mL/hr over 30 Minutes Intravenous Every 8 hours 05/06/19 1340     05/06/19 1230  ceFAZolin  (ANCEF) IVPB 2g/100 mL premix  Status:  Discontinued     2 g 200 mL/hr over 30 Minutes Intravenous Every 8 hours 05/06/19 1200 05/06/19 1340   05/06/19 1030  vancomycin (VANCOREADY) IVPB 1250 mg/250 mL  Status:  Discontinued     1,250 mg 166.7 mL/hr over 90 Minutes Intravenous  Once 05/06/19 0955 05/06/19 1339   05/05/19 1530  piperacillin-tazobactam (ZOSYN) IVPB 3.375 g  Status:  Discontinued     3.375 g 12.5 mL/hr over 240 Minutes Intravenous Every 8 hours 05/05/19 1454 05/06/19 1147   05/05/19 1200  piperacillin-tazobactam (ZOSYN) IVPB 3.375 g  Status:  Discontinued     3.375 g 100 mL/hr over 30 Minutes Intravenous Every 6 hours 05/05/19 1100 05/05/19 1451   05/04/19 1715  azithromycin (ZITHROMAX) 500 mg in sodium chloride 0.9 % 250 mL IVPB  Status:  Discontinued     500 mg 250 mL/hr over 60 Minutes Intravenous Every 24 hours 05/04/19 1030 05/05/19 1100   05/04/19 1715  cefTRIAXone (ROCEPHIN) 2 g in sodium chloride 0.9 % 100 mL IVPB  Status:  Discontinued     2 g 200 mL/hr over 30 Minutes Intravenous Every 24 hours 05/04/19 1030 05/05/19 1100   05/03/19 1715  cefTRIAXone (ROCEPHIN) 2 g in sodium chloride 0.9 % 100 mL IVPB  Status:  Discontinued     2 g 200 mL/hr over 30 Minutes Intravenous Every 24 hours 05/02/19 2024 05/04/19 1030   05/03/19 1715  azithromycin (ZITHROMAX) 500 mg in sodium chloride 0.9 % 250 mL IVPB  Status:  Discontinued     500 mg 250 mL/hr over 60 Minutes Intravenous Every 24 hours 05/02/19 2024 05/04/19 1030   05/03/19 0600  vancomycin (VANCOREADY) IVPB 750 mg/150 mL  Status:  Discontinued     750 mg 150 mL/hr over 60 Minutes Intravenous Every 12 hours 05/02/19 1735 05/02/19 1933   05/02/19 1715  vancomycin (VANCOCIN) IVPB 1000 mg/200 mL premix     1,000 mg 200 mL/hr over 60 Minutes Intravenous  Once 05/02/19 1705 05/02/19 1938   05/02/19 1715  ceFEPIme (MAXIPIME) 2 g in sodium chloride 0.9 % 100 mL IVPB     2 g 200 mL/hr over 30 Minutes Intravenous  Once  05/02/19 1705 05/02/19 1938   05/02/19 1715  azithromycin (ZITHROMAX) 500 mg in sodium chloride 0.9 % 250 mL IVPB     500 mg 250 mL/hr over 60 Minutes Intravenous  Once 05/02/19 1705 05/02/19 1858     Subjective  The patient is resting comfortably. No new complaints. He is encouraged to use the incentive spirometer.  Objective   Vitals:  Vitals:   05/06/19 1330 05/06/19 1345  BP: 123/76 112/70  Pulse:    Resp:  (!) 36  Temp:  98.6 F (37 C)  SpO2: 94% 94%   Exam:  Constitutional:  . The patient is awake, alert, and oriented x 3. No acute distress. Respiratory:  . Positive for accessory muscle use and tachypnea.  Marland Kitchen Positive for rales at right base and rhonchi. No wheezes. . No tactile fremitus Cardiovascular:  . Regular rate and rhythm . No murmurs, ectopy, or gallups. Marland Kitchen No  lateral PMI. No thrills. Abdomen:  . Abdomen is soft, non-tender, non-distended . No hernias, masses, or organomegaly . Normoactive bowel sounds.  Musculoskeletal:  . No cyanosis, clubbing, or edema Skin:  . No rashes, lesions, ulcers . palpation of skin: no induration or nodules Neurologic:  . CN 2-12 intact . Sensation all 4 extremities intact Psychiatric:  . Mental status o Mood, affect appropriate o Orientation to person, place, time  . judgment and insight appear intact  I have personally reviewed the following:   Today's Data  . Vitals, CBC, CMP, Iron studies . 1/4 blood cultures positive for staph aureus. Sensitivities pending.  . Urine culture positive for E. coli  Imaging  . CTA chest . CXR 05/04/2019  Scheduled Meds: . divalproex  500 mg Oral QHS  . feeding supplement (ENSURE ENLIVE)  237 mL Oral BID BM  . guaiFENesin  1,200 mg Oral BID  . methylPREDNISolone (SOLU-MEDROL) injection  60 mg Intravenous Q6H  . multivitamin with minerals  1 tablet Oral Daily  . sertraline  25 mg Oral Daily  . sodium chloride flush  3 mL Intravenous Q12H  . sodium chloride flush  3 mL  Intravenous Q12H  . umeclidinium bromide  1 puff Inhalation Daily   Continuous Infusions: . sodium chloride    .  ceFAZolin (ANCEF) IV 2 g (05/06/19 1357)  . vancomycin    . [START ON 05/07/2019] vancomycin      Principal Problem:   Staphylococcus aureus bacteremia Active Problems:   Multifocal pneumonia   CAD (coronary artery disease)   Essential hypertension   COPD with acute exacerbation (HCC)   Major depression, recurrent (HCC)   Seizure disorder (HCC)   Severe sepsis (HCC)   Acute on chronic respiratory failure with hypoxia (HCC)   AKI (acute kidney injury) ([facility])   Acute blood loss anemia   LOS: 4 days   A & P  Sepsis secondary to PNA : Hypotension, tachycardia, leukocytosis, lactic acidosis, AKI, and increased oxygen requirements. CXR with worsening bilateral airspace disease. Blood cultures x 2 were drawn in the ED.  Urinalysis is positive for UTI. Urine culture has grown out E. Coli. Sensitivities are pending. They patient has been started on Rocephin and azithromycin. Blood pressures are improved, but the patient remains tachycardic. Leukocytosis is decreased as is lactic acid. Oxygen requirements are increased. Today lung sounds were very coarse and oxygen requirements are increased. CXR performed on 05/04/2019 demonstrated worsening consolidate at the right base. The patient has increasing oxygen requirements. Blood cultures 1/4 have grown out GPC in clusters and Urine culture has grown out E. Coli. I have stopped rocephin and azithromycin. I have started zosyn. Will add guaifenesin, incentive spirometry, and flutter valve. As 1/4 blood cultures is now known to have grown out staph aureus, infectious disease has changed his antibiotics to Vancomycin and ancef.   Acute kidney injury: Creatinine is down to 0.66 today. SCr was 1.09 in ED, up from 0.55 prior to admission.  The creatinine was elevated due to prerenal azotemia in setting of hypotension. The patient was  fluid-resuscitated in ED. Will monitor creatinine, electrolytes, and volume status. Avoid nephrotoxic substances and hypotension. Antihypertensives are held. Continue IV fluids.   Epistaxis and/or hemoptysis: Anemia has dropped to 7.5 from 11.5 on 03/31/2019. Epistaxis has stopped. Monitor and transfuse for hemoglobin less than 7.0. Platelets are normal, but antiplatelet agents are held.  Iron studies reveal severe iron deficiency. I will supplement. Monitor anemia.   COPD with acute exacerbation:  Exacerbation due to pneumonia. Continue albuterol, Spiriva, and systemic steroids with IV antibiotics. Sputum cultures pending.  CAD: Noted. Appears stable. No anginal complaints. Antiplatelet agents are pending.    Seizure disorder: Continue Depakote. Monitor.     Recent COVID-19 infection: Patient was reportedly diagnosed with COVID on 04/09/19 as documented in progress note from his nursing home that same day. I confirmed with our Infection Prevention dept that since it has been >21 days but <90 days. So, despite positive test result from the ED, he should not be on special precautions or in a COVID unit. The Infection Prevention dept stated that they would also put a note in his chart to this effect. The current respiratory sxs are suspected to be secondary to a bacterial PNA which may be sequela of the recent COVID.   Malnutrition: Moderate and likely chronic. Pt is receiving nutritional supplements. I appreciate dietician's assistance.  I have seen and examined this patient myself. I have spent 30 minutes in his evaluation and care.    DVT prophylaxis: SCD's  Code Status: Full, confirmed with patient  Family Communication: Discussed with patient  Disposition Plan: Will likely require SNF on discharge once sepsis resolved, respiratory status improved and stable, and bleeding/anemia stabilized.   Samantha Ragen, DO [facility] Direct contact: see [email or web address]  7PM-7AM contact night coverage as  above 05/06/2019,12:38 PM  LOS: 1 day

## 2019-05-06 NOTE — Significant Event (Signed)
Rapid Response Event Note  Overview: Respiratory  Initial Focused Assessment: Nursing staff called with concerns of patient being in distress. I originally received a call from the RT earlier but I was in with another patient in an emergency. When I came up to see the patient, Mr. 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 Lung sounds - clear in the upper sounds and diminished breath sounds. Skin warm and dry. HR 100-115 - ST, stable BP. Nurse transitioned 9L HFNC (was on NRB 15L - for about an hour). Saturations 93%-95% on HFNC 9L.   Interventions: -- No RRT Interventions   Plan of Care: -- Wean oxygen to keep oxygen saturations > 88%  -- IS/FV, encourage OOB with assistance -- Could utilize [facility] if needed  Event Summary:  1st Call Time 1248 ( was with another patient in a medical emergency, when I called the nurse at 1400, she updated me on the patient and had already received orders from the [facility] MD. As I was walking to 3E, the nursing staff called me at 1455 and I came up to see the patient   Start Time 1455 Arrival Time 1456 End Time 1535   Ninnie Fein R

## 2019-05-06 NOTE — Progress Notes (Addendum)
PROGRESS NOTE  Paul Suarez BOF:[id number] DOB: [birth year] DOA: 05/02/2019 PCP: Pecola Lawless, MD  Brief History    Paul Suarez is a 79 y.o. male with medical history significant for emphysema with chronic hypoxic respiratory failure with a 2L O2 requirement at baseline, seizure disorder, hypertension, depression, coronary artery disease, and recent COVID-19 infection, now presenting from his SNF for evaluation of epistaxis.  Patient had been diagnosed with Covid approximately 3-4 weeks ago per report, had been treated with multiple rounds of antibiotics, was seen in the emergency department yesterday for possible hemoptysis and had CTA chest that was negative for large central PE but concerning for increased patchy bilateral airspace disease suggestive of a multifocal pneumonia.  He then developed epistaxis today at his nursing facility, was started on Afrin nasal spray, initially had difficulty controlling the bleeding, and EMS was called.  The bleeding had stopped, but the patient was noted to be tachycardic with low blood pressure and increased oxygen requirements, and was brought into the ED.  Patient denies any chest pain, reports that his cough and shortness of breath has been worse recently, denies any melena or hematochezia, and denies any abdominal pain.   ED Course: Upon arrival to the ED, patient is found to be afebrile, saturating mid 80s on 3 L/min of supplemental oxygen, tachypneic, tachycardic to 130, and with blood pressure 86/61.  EKG features sinus tachycardia with rate 123 and ST abnormalities that appear similar to priors.  Chest x-ray demonstrates interval worsening in bilateral pulmonary airspace disease.  Chemistry panel notable for BUN of 54, up from 26 yesterday.  Albumin is only 1.9.  CBC features a new leukocytosis to 32,200 and anemia with hemoglobin 9.0, down from 11.5 yesterday.  Initial lactic acid is 3.7.  Patient was given 2 L of lactated Ringer's, cefepime, azithromycin,  and vancomycin in the ED.  Is also treated with albuterol in the emergency department, blood and urine cultures were ordered.  COVID-19 was tested in the ED. It was positive, as it would be expected to be. The patient initially tested positive on 04/09/2019. So, he is outside the 21 day period when he would require isolation.  [facility] were consulted to admit the patient for further evaluation and treatment.  The patient is receiving IV steroids which are being tapered down, nebulizer treatments, and rocephin and azithromycin. He received IV fluids for correction of his acute renal insufficiency. He is receiving supplemental O2. Nutrition has been consulted for evaluation and treatment of his malnourished status. His oxygen requirements remain high.  Due to worsening cough, increasing oxygen requirements and 1/4 blood cultures + for GPC in clusters I have changed the patient from Rocephin and azithromycin to Zosyn. Urine culture has grown out e. Coli. Organism grown out of blood culture has been identified as staph aureus. Sensitivities are pending.  Antibiotics have been changed to Vancomycin and ancef per infectious disease.  Consultants  . None  Procedures  . None  Antibiotics   Anti-infectives (From admission, onward)   Start     Dose/Rate Route Frequency Ordered Stop   05/07/19 0200  vancomycin (VANCOREADY) IVPB 750 mg/150 mL     750 mg 150 mL/hr over 60 Minutes Intravenous Every 12 hours 05/06/19 1340     05/06/19 2300  vancomycin (VANCOREADY) IVPB 750 mg/150 mL  Status:  Discontinued     750 mg 150 mL/hr over 60 Minutes Intravenous Every 12 hours 05/06/19 1018 05/06/19 1340   05/06/19 1415  vancomycin (VANCOREADY)  IVPB 1250 mg/250 mL     1,250 mg 166.7 mL/hr over 90 Minutes Intravenous  Once 05/06/19 1340     05/06/19 1415  ceFAZolin (ANCEF) IVPB 2g/100 mL premix     2 g 200 mL/hr over 30 Minutes Intravenous Every 8 hours 05/06/19 1340     05/06/19 1230  ceFAZolin  (ANCEF) IVPB 2g/100 mL premix  Status:  Discontinued     2 g 200 mL/hr over 30 Minutes Intravenous Every 8 hours 05/06/19 1200 05/06/19 1340   05/06/19 1030  vancomycin (VANCOREADY) IVPB 1250 mg/250 mL  Status:  Discontinued     1,250 mg 166.7 mL/hr over 90 Minutes Intravenous  Once 05/06/19 0955 05/06/19 1339   05/05/19 1530  piperacillin-tazobactam (ZOSYN) IVPB 3.375 g  Status:  Discontinued     3.375 g 12.5 mL/hr over 240 Minutes Intravenous Every 8 hours 05/05/19 1454 05/06/19 1147   05/05/19 1200  piperacillin-tazobactam (ZOSYN) IVPB 3.375 g  Status:  Discontinued     3.375 g 100 mL/hr over 30 Minutes Intravenous Every 6 hours 05/05/19 1100 05/05/19 1451   05/04/19 1715  azithromycin (ZITHROMAX) 500 mg in sodium chloride 0.9 % 250 mL IVPB  Status:  Discontinued     500 mg 250 mL/hr over 60 Minutes Intravenous Every 24 hours 05/04/19 1030 05/05/19 1100   05/04/19 1715  cefTRIAXone (ROCEPHIN) 2 g in sodium chloride 0.9 % 100 mL IVPB  Status:  Discontinued     2 g 200 mL/hr over 30 Minutes Intravenous Every 24 hours 05/04/19 1030 05/05/19 1100   05/03/19 1715  cefTRIAXone (ROCEPHIN) 2 g in sodium chloride 0.9 % 100 mL IVPB  Status:  Discontinued     2 g 200 mL/hr over 30 Minutes Intravenous Every 24 hours 05/02/19 2024 05/04/19 1030   05/03/19 1715  azithromycin (ZITHROMAX) 500 mg in sodium chloride 0.9 % 250 mL IVPB  Status:  Discontinued     500 mg 250 mL/hr over 60 Minutes Intravenous Every 24 hours 05/02/19 2024 05/04/19 1030   05/03/19 0600  vancomycin (VANCOREADY) IVPB 750 mg/150 mL  Status:  Discontinued     750 mg 150 mL/hr over 60 Minutes Intravenous Every 12 hours 05/02/19 1735 05/02/19 1933   05/02/19 1715  vancomycin (VANCOCIN) IVPB 1000 mg/200 mL premix     1,000 mg 200 mL/hr over 60 Minutes Intravenous  Once 05/02/19 1705 05/02/19 1938   05/02/19 1715  ceFEPIme (MAXIPIME) 2 g in sodium chloride 0.9 % 100 mL IVPB     2 g 200 mL/hr over 30 Minutes Intravenous  Once  05/02/19 1705 05/02/19 1938   05/02/19 1715  azithromycin (ZITHROMAX) 500 mg in sodium chloride 0.9 % 250 mL IVPB     500 mg 250 mL/hr over 60 Minutes Intravenous  Once 05/02/19 1705 05/02/19 1858     Subjective  The patient is resting comfortably. No new complaints. He is encouraged to use the incentive spirometer.  Objective   Vitals:  Vitals:   05/06/19 1330 05/06/19 1345  BP: 123/76 112/70  Pulse:    Resp:  (!) 36  Temp:  98.6 F (37 C)  SpO2: 94% 94%   Exam:  Constitutional:  . The patient is awake, alert, and oriented x 3. No acute distress. Respiratory:  . Positive for accessory muscle use and tachypnea.  Marland Kitchen Positive for rales at right base and rhonchi. No wheezes. . No tactile fremitus Cardiovascular:  . Regular rate and rhythm . No murmurs, ectopy, or gallups. Marland Kitchen No  lateral PMI. No thrills. Abdomen:  . Abdomen is soft, non-tender, non-distended . No hernias, masses, or organomegaly . Normoactive bowel sounds.  Musculoskeletal:  . No cyanosis, clubbing, or edema Skin:  . No rashes, lesions, ulcers . palpation of skin: no induration or nodules Neurologic:  . CN 2-12 intact . Sensation all 4 extremities intact Psychiatric:  . Mental status o Mood, affect appropriate o Orientation to person, place, time  . judgment and insight appear intact  I have personally reviewed the following:   Today's Data  . Vitals, CBC, CMP, Iron studies . 1/4 blood cultures positive for staph aureus. Sensitivities pending.  . Urine culture positive for E. coli  Imaging  . CTA chest . CXR 05/04/2019  Scheduled Meds: . divalproex  500 mg Oral QHS  . feeding supplement (ENSURE ENLIVE)  237 mL Oral BID BM  . guaiFENesin  1,200 mg Oral BID  . methylPREDNISolone (SOLU-MEDROL) injection  60 mg Intravenous Q6H  . multivitamin with minerals  1 tablet Oral Daily  . sertraline  25 mg Oral Daily  . sodium chloride flush  3 mL Intravenous Q12H  . sodium chloride flush  3 mL  Intravenous Q12H  . umeclidinium bromide  1 puff Inhalation Daily   Continuous Infusions: . sodium chloride    .  ceFAZolin (ANCEF) IV 2 g (05/06/19 1357)  . vancomycin    . [START ON 05/07/2019] vancomycin      Principal Problem:   Staphylococcus aureus bacteremia Active Problems:   Multifocal pneumonia   CAD (coronary artery disease)   Essential hypertension   COPD with acute exacerbation (HCC)   Major depression, recurrent (HCC)   Seizure disorder (HCC)   Severe sepsis (HCC)   Acute on chronic respiratory failure with hypoxia (HCC)   AKI (acute kidney injury) ([facility])   Acute blood loss anemia   LOS: 4 days   A & P  Sepsis secondary to PNA : Hypotension, tachycardia, leukocytosis, lactic acidosis, AKI, and increased oxygen requirements. CXR with worsening bilateral airspace disease. Blood cultures x 2 were drawn in the ED.  Urinalysis is positive for UTI. Urine culture has grown out E. Coli. Sensitivities are pending. They patient has been started on Rocephin and azithromycin. Blood pressures are improved, but the patient remains tachycardic. Leukocytosis is decreased as is lactic acid. Oxygen requirements are increased. Today lung sounds were very coarse and oxygen requirements are increased. CXR performed on 05/04/2019 demonstrated worsening consolidate at the right base. The patient has increasing oxygen requirements. Blood cultures 1/4 have grown out GPC in clusters and Urine culture has grown out E. Coli. I have stopped rocephin and azithromycin. I have started zosyn. Will add guaifenesin, incentive spirometry, and flutter valve. As 1/4 blood cultures is now known to have grown out staph aureus, infectious disease has changed his antibiotics to Vancomycin and ancef.   Acute kidney injury: Creatinine is down to 0.66 today. SCr was 1.09 in ED, up from 0.55 prior to admission.  The creatinine was elevated due to prerenal azotemia in setting of hypotension. The patient was  fluid-resuscitated in ED. Will monitor creatinine, electrolytes, and volume status. Avoid nephrotoxic substances and hypotension. Antihypertensives are held. Continue IV fluids.   Epistaxis and/or hemoptysis: Anemia has dropped to 7.5 from 11.5 on 03/31/2019. Epistaxis has stopped. Monitor and transfuse for hemoglobin less than 7.0. Platelets are normal, but antiplatelet agents are held.  Iron studies reveal severe iron deficiency. I will supplement. Monitor anemia.   COPD with acute exacerbation:  Exacerbation due to pneumonia. Continue albuterol, Spiriva, and systemic steroids with IV antibiotics. Sputum cultures pending.  CAD: Noted. Appears stable. No anginal complaints. Antiplatelet agents are pending.    Seizure disorder: Continue Depakote. Monitor.     Recent COVID-19 infection: Patient was reportedly diagnosed with COVID on 04/09/19 as documented in progress note from his nursing home that same day. I confirmed with our Infection Prevention dept that since it has been >21 days but <90 days. So, despite positive test result from the ED, he should not be on special precautions or in a COVID unit. The Infection Prevention dept stated that they would also put a note in his chart to this effect. The current respiratory sxs are suspected to be secondary to a bacterial PNA which may be sequela of the recent COVID.   Malnutrition: Moderate and likely chronic. Pt is receiving nutritional supplements. I appreciate dietician's assistance.  I have seen and examined this patient myself. I have spent 30 minutes in his evaluation and care.    DVT prophylaxis: SCD's  Code Status: Full, confirmed with patient  Family Communication: Discussed with patient  Disposition Plan: Will likely require SNF on discharge once sepsis resolved, respiratory status improved and stable, and bleeding/anemia stabilized.   Alyus Mofield, DO [facility] Direct contact: see [email or web address]  7PM-7AM contact night coverage as  above 05/06/2019,12:38 PM  LOS: 1 day   ADDENDUM: I was contacted by nursing at 12:48 regarding abrupt increase in oxygen requirement. Rapid response was called. The patient was saturating in the low to mid nineties on 100% NRB. HR was in the 130's. EKG has confirmed that the patient is in Sinus tachycardia. IV lasix was ordered. ABG demonstrated hypoxia, but no issues with ventilation or CO2 retention. CXR demonstrates worsening of pneumonia. I have discussed the patient with his daughter. She is considering a change to his code status. She will come up here to see him soon. I will consult pulmonology as well.  Additional 42 minutes in the direct care of the patient, counseling with family and coordination of care with pulmonology.

## 2019-05-07 ENCOUNTER — Inpatient Hospital Stay (HOSPITAL_COMMUNITY): Payer: Medicare Other

## 2019-05-07 DIAGNOSIS — R7881 Bacteremia: Secondary | ICD-10-CM

## 2019-05-07 LAB — CBC WITH DIFFERENTIAL/PLATELET
Abs Immature Granulocytes: 0.47 10*3/uL — ABNORMAL HIGH (ref 0.00–0.07)
Basophils Absolute: 0.1 10*3/uL (ref 0.0–0.1)
Basophils Relative: 0 %
Eosinophils Absolute: 0 10*3/uL (ref 0.0–0.5)
Eosinophils Relative: 0 %
HCT: 25.9 % — ABNORMAL LOW (ref 39.0–52.0)
Hemoglobin: 8.2 g/dL — ABNORMAL LOW (ref 13.0–17.0)
Immature Granulocytes: 3 %
Lymphocytes Relative: 7 %
Lymphs Abs: 1.2 10*3/uL (ref 0.7–4.0)
MCH: 29.7 pg (ref 26.0–34.0)
MCHC: 31.7 g/dL (ref 30.0–36.0)
MCV: 93.8 fL (ref 80.0–100.0)
Monocytes Absolute: 0.5 10*3/uL (ref 0.1–1.0)
Monocytes Relative: 3 %
Neutro Abs: 14.9 10*3/uL — ABNORMAL HIGH (ref 1.7–7.7)
Neutrophils Relative %: 87 %
Platelets: 274 10*3/uL (ref 150–400)
RBC: 2.76 MIL/uL — ABNORMAL LOW (ref 4.22–5.81)
RDW: 15.7 % — ABNORMAL HIGH (ref 11.5–15.5)
WBC: 17.1 10*3/uL — ABNORMAL HIGH (ref 4.0–10.5)
nRBC: 0.4 % — ABNORMAL HIGH (ref 0.0–0.2)

## 2019-05-07 LAB — BASIC METABOLIC PANEL
Anion gap: 12 (ref 5–15)
BUN: 27 mg/dL — ABNORMAL HIGH (ref 8–23)
CO2: 27 mmol/L (ref 22–32)
Calcium: 8.3 mg/dL — ABNORMAL LOW (ref 8.9–10.3)
Chloride: 100 mmol/L (ref 98–111)
Creatinine, Ser: 0.61 mg/dL (ref 0.61–1.24)
GFR calc Af Amer: >60 mL/min (ref >60)
GFR calc non Af Amer: >60 mL/min (ref >60)
Glucose, Bld: 131 mg/dL — ABNORMAL HIGH (ref 70–99)
Potassium: 4.4 mmol/L (ref 3.5–5.1)
Sodium: 139 mmol/L (ref 135–145)

## 2019-05-07 LAB — CULTURE, BLOOD (ROUTINE X 2)
Culture: NO GROWTH
Special Requests: ADEQUATE

## 2019-05-07 LAB — ECHOCARDIOGRAM LIMITED
Height: 71 in
Weight: 2077.62 oz

## 2019-05-07 LAB — HEPATITIS C ANTIBODY: HCV Ab: NONREACTIVE

## 2019-05-07 NOTE — Progress Notes (Addendum)
PROGRESS NOTE  Paul Suarez BFX:[id number] DOB: 1940-05-28 DOA: 05/02/2019 PCP: Pecola Lawless, MD  Brief History    Paul Suarez is a 79 y.o. male with medical history significant for emphysema with chronic hypoxic respiratory failure with a 2L O2 requirement at baseline, seizure disorder, hypertension, depression, coronary artery disease, and recent COVID-19 infection, now presenting from his SNF for evaluation of epistaxis.  Patient had been diagnosed with Covid approximately 3-4 weeks ago per report, had been treated with multiple rounds of antibiotics, was seen in the emergency department yesterday for possible hemoptysis and had CTA chest that was negative for large central PE but concerning for increased patchy bilateral airspace disease suggestive of a multifocal pneumonia.  He then developed epistaxis today at his nursing facility, was started on Afrin nasal spray, initially had difficulty controlling the bleeding, and EMS was called.  The bleeding had stopped, but the patient was noted to be tachycardic with low blood pressure and increased oxygen requirements, and was brought into the ED.  Patient denies any chest pain, reports that his cough and shortness of breath has been worse recently, denies any melena or hematochezia, and denies any abdominal pain.   ED Course: Upon arrival to the ED, patient is found to be afebrile, saturating mid 80s on 3 L/min of supplemental oxygen, tachypneic, tachycardic to 130, and with blood pressure 86/61.  EKG features sinus tachycardia with rate 123 and ST abnormalities that appear similar to priors.  Chest x-ray demonstrates interval worsening in bilateral pulmonary airspace disease.  Chemistry panel notable for BUN of 54, up from 26 yesterday.  Albumin is only 1.9.  CBC features a new leukocytosis to 32,200 and anemia with hemoglobin 9.0, down from 11.5 yesterday.  Initial lactic acid is 3.7.  Patient was given 2 L of lactated Ringer's, cefepime, azithromycin,  and vancomycin in the ED.  Is also treated with albuterol in the emergency department, blood and urine cultures were ordered.  COVID-19 was tested in the ED. It was positive, as it would be expected to be. The patient initially tested positive on 04/09/2019. So, he is outside the 21 day period when he would require isolation.  [facility] were consulted to admit the patient for further evaluation and treatment.  The patient is receiving IV steroids which are being tapered down, nebulizer treatments, and rocephin and azithromycin. He received IV fluids for correction of his acute renal insufficiency. He is receiving supplemental O2. Nutrition has been consulted for evaluation and treatment of his malnourished status. His oxygen requirements remain high.  Due to worsening cough, increasing oxygen requirements and 1/4 blood cultures + for GPC in clusters I have changed the patient from Rocephin and azithromycin to Zosyn. Urine culture has grown out e. Coli. Organism grown out of blood culture has been identified as staph aureus. Sensitivities are pending.  Antibiotics have been changed to Vancomycin and ancef per infectious disease.  Rapid Response called on afternoon of 05/06/2019 with increase of oxygen requirements to 15L. CXR demonstrated worsening pneumonia.Family updated. Code Status changed to DNR. Lasix IV given. Pulmonary critical care consulted.  Continue supportive care.    Consultants  . None  Procedures  . None  Antibiotics   Anti-infectives (From admission, onward)   Start     Dose/Rate Route Frequency Ordered Stop   05/07/19 0200  vancomycin (VANCOREADY) IVPB 750 mg/150 mL     750 mg 150 mL/hr over 60 Minutes Intravenous Every 12 hours 05/06/19 1340     05/06/19  2300  vancomycin (VANCOREADY) IVPB 750 mg/150 mL  Status:  Discontinued     750 mg 150 mL/hr over 60 Minutes Intravenous Every 12 hours 05/06/19 1018 05/06/19 1340   05/06/19 1415  vancomycin (VANCOREADY) IVPB  1250 mg/250 mL     1,250 mg 166.7 mL/hr over 90 Minutes Intravenous  Once 05/06/19 1340 05/06/19 1936   05/06/19 1415  ceFAZolin (ANCEF) IVPB 2g/100 mL premix     2 g 200 mL/hr over 30 Minutes Intravenous Every 8 hours 05/06/19 1340     05/06/19 1230  ceFAZolin (ANCEF) IVPB 2g/100 mL premix  Status:  Discontinued     2 g 200 mL/hr over 30 Minutes Intravenous Every 8 hours 05/06/19 1200 05/06/19 1340   05/06/19 1030  vancomycin (VANCOREADY) IVPB 1250 mg/250 mL  Status:  Discontinued     1,250 mg 166.7 mL/hr over 90 Minutes Intravenous  Once 05/06/19 0955 05/06/19 1339   05/05/19 1530  piperacillin-tazobactam (ZOSYN) IVPB 3.375 g  Status:  Discontinued     3.375 g 12.5 mL/hr over 240 Minutes Intravenous Every 8 hours 05/05/19 1454 05/06/19 1147   05/05/19 1200  piperacillin-tazobactam (ZOSYN) IVPB 3.375 g  Status:  Discontinued     3.375 g 100 mL/hr over 30 Minutes Intravenous Every 6 hours 05/05/19 1100 05/05/19 1451   05/04/19 1715  azithromycin (ZITHROMAX) 500 mg in sodium chloride 0.9 % 250 mL IVPB  Status:  Discontinued     500 mg 250 mL/hr over 60 Minutes Intravenous Every 24 hours 05/04/19 1030 05/05/19 1100   05/04/19 1715  cefTRIAXone (ROCEPHIN) 2 g in sodium chloride 0.9 % 100 mL IVPB  Status:  Discontinued     2 g 200 mL/hr over 30 Minutes Intravenous Every 24 hours 05/04/19 1030 05/05/19 1100   05/03/19 1715  cefTRIAXone (ROCEPHIN) 2 g in sodium chloride 0.9 % 100 mL IVPB  Status:  Discontinued     2 g 200 mL/hr over 30 Minutes Intravenous Every 24 hours 05/02/19 2024 05/04/19 1030   05/03/19 1715  azithromycin (ZITHROMAX) 500 mg in sodium chloride 0.9 % 250 mL IVPB  Status:  Discontinued     500 mg 250 mL/hr over 60 Minutes Intravenous Every 24 hours 05/02/19 2024 05/04/19 1030   05/03/19 0600  vancomycin (VANCOREADY) IVPB 750 mg/150 mL  Status:  Discontinued     750 mg 150 mL/hr over 60 Minutes Intravenous Every 12 hours 05/02/19 1735 05/02/19 1933   05/02/19 1715   vancomycin (VANCOCIN) IVPB 1000 mg/200 mL premix     1,000 mg 200 mL/hr over 60 Minutes Intravenous  Once 05/02/19 1705 05/02/19 1938   05/02/19 1715  ceFEPIme (MAXIPIME) 2 g in sodium chloride 0.9 % 100 mL IVPB     2 g 200 mL/hr over 30 Minutes Intravenous  Once 05/02/19 1705 05/02/19 1938   05/02/19 1715  azithromycin (ZITHROMAX) 500 mg in sodium chloride 0.9 % 250 mL IVPB     500 mg 250 mL/hr over 60 Minutes Intravenous  Once 05/02/19 1705 05/02/19 1858     Subjective  The patient is resting comfortably.  Objective   Vitals:  Vitals:   05/07/19 0803 05/07/19 1220  BP: 119/84 116/70  Pulse:    Resp:    Temp: 98.3 F (36.8 C) 97.7 F (36.5 C)  SpO2:     Exam:  Constitutional:  . The patient is awake, alert, and oriented x 3. No acute distress. Respiratory:  . No increased work of breathing. Marland Kitchen Positive for rales  at right base and rhonchi. No wheezes. . No tactile fremitus Cardiovascular:  . Regular rate and rhythm . No murmurs, ectopy, or gallups. . No lateral PMI. No thrills. Abdomen:  . Abdomen is soft, non-tender, non-distended . No hernias, masses, or organomegaly . Normoactive bowel sounds.  Musculoskeletal:  . No cyanosis, clubbing, or edema Skin:  . No rashes, lesions, ulcers . palpation of skin: no induration or nodules Neurologic:  . CN 2-12 intact . Sensation all 4 extremities intact Psychiatric:  . Mental status o Mood, affect appropriate o Orientation to person, place, time  . judgment and insight appear intact  I have personally reviewed the following:   Today's Data  . Vitals, CBC, CMP, Iron studies . 1/4 blood cultures positive for staph aureus. Sensitivities pending.  . Urine culture positive for E. coli  Imaging  . CTA chest . CXR 05/04/2019  Scheduled Meds: . divalproex  500 mg Oral QHS  . feeding supplement (ENSURE ENLIVE)  237 mL Oral BID BM  . guaiFENesin  1,200 mg Oral BID  . methylPREDNISolone (SOLU-MEDROL) injection  60 mg  Intravenous Q6H  . multivitamin with minerals  1 tablet Oral Daily  . sertraline  25 mg Oral Daily  . sodium chloride flush  3 mL Intravenous Q12H  . sodium chloride flush  3 mL Intravenous Q12H  . umeclidinium bromide  1 puff Inhalation Daily   Continuous Infusions: . sodium chloride    .  ceFAZolin (ANCEF) IV 2 g (05/07/19 0508)  . vancomycin 750 mg (05/07/19 0133)    Principal Problem:   Staphylococcus aureus bacteremia Active Problems:   Multifocal pneumonia   CAD (coronary artery disease)   Essential hypertension   COPD with acute exacerbation (HCC)   Major depression, recurrent (HCC)   Seizure disorder (HCC)   Severe sepsis (HCC)   Acute on chronic respiratory failure with hypoxia (HCC)   AKI (acute kidney injury) (HCC)   Acute blood loss anemia   LOS: 5 days   A & P  Sepsis secondary to PNA : Hypotension, tachycardia, leukocytosis, lactic acidosis, AKI, and increased oxygen requirements. CXR with worsening bilateral airspace disease. Blood cultures x 2 were drawn in the ED.  Urinalysis is positive for UTI. Urine culture has grown out E. Coli. Sensitivities are pending. They patient has been started on Rocephin and azithromycin. Blood pressures are improved, but the patient remains tachycardic. Leukocytosis is decreased as is lactic acid. Oxygen requirements are increased. Today lung sounds were very coarse and oxygen requirements are increased. CXR performed on 05/04/2019 demonstrated worsening consolidate at the right base. The patient has increasing oxygen requirements. He is now requiring 15L HFNC to maintain saturations in of 100%.  Blood cultures 1/4 have grown out GPC in clusters and Urine culture has grown out E. Coli. I have stopped rocephin and azithromycin. I have started zosyn. Will add guaifenesin, incentive spirometry, and flutter valve. As 1/4 blood cultures is now known to have grown out staph aureus, infectious disease has changed his antibiotics to Vancomycin and  ancef. Echocardiogram was obtained on 05/06/2019 to guide therapy. It demonstrated EF of 60-65% with no regional wall motion abnormalities and Grade I diastolic dysfuntion. RV systolic function is nomal and RV size is normal. There is moderate calcification of the mitral valve leaflet. No mitral stenosis or regurgitation. There is no aortic stenosis. No valvular vegetations were seen.    Acute kidney injury: Creatinine is down to 0.66 today. SCr was 1.09 in ED, up from  0.55 prior to admission.  The creatinine was elevated due to prerenal azotemia in setting of hypotension. The patient was fluid-resuscitated in ED. Will monitor creatinine, electrolytes, and volume status. Avoid nephrotoxic substances and hypotension. Antihypertensives are held. Continue IV fluids.  Acute hypoxic respiratory failure: Oxygen requirements have increased abruptly. CXR from 05/06/2019 demonstrates a worsening of the appearance of the pneumonia. Currently the patient is requiring 15L by HFNC, but he is maintaining saturations of 100%. Echocardiogram was obtained on 05/06/2019 to guide therapy. It demonstrated EF of 60-65% with no regional wall motion abnormalities and Grade I diastolic dysfuntion. RV systolic function is nomal and RV size is normal. There is moderate calcification of the mitral valve leaflet. No mitral stenosis or regurgitation. There is no aortic stenosis. No valvular vegetations were seen.   Epistaxis and/or hemoptysis: Anemia has dropped to 8.2 from 11.5 on 03/31/2019. Epistaxis has stopped. Monitor and transfuse for hemoglobin less than 7.0. Platelets are normal, but antiplatelet agents are held.  Iron studies reveal severe iron deficiency. I will supplement. Monitor anemia.   COPD with acute exacerbation: Exacerbation due to pneumonia. Continue albuterol, Spiriva, and systemic steroids with IV antibiotics. Sputum cultures pending.  CAD: Noted. Appears stable. No anginal complaints. Antiplatelet agents are  pending.    Seizure disorder: Continue Depakote. Monitor.     Recent COVID-19 infection: Patient was reportedly diagnosed with COVID on 04/09/19 as documented in progress note from his nursing home that same day. I confirmed with our Infection Prevention dept that since it has been >21 days but <90 days. So, despite positive test result from the ED, he should not be on special precautions or in a COVID unit. The Infection Prevention dept stated that they would also put a note in his chart to this effect. The current respiratory sxs are suspected to be secondary to a bacterial PNA which may be sequela of the recent COVID.   Malnutrition: Moderate and likely chronic. Pt is receiving nutritional supplements. I appreciate dietician's assistance.  I have seen and examined this patient myself. I have spent 38 minutes in his evaluation and care.    DVT prophylaxis: SCD's  Code Status: Full, confirmed with patient  Family Communication: Discussed with patient  Disposition Plan: Will likely require SNF on discharge once sepsis resolved, respiratory status improved and stable, and bleeding/anemia stabilized.   Paul Gheen, DO [facility] Direct contact: see [email or web address]  7PM-7AM contact night coverage as above 05/07/2019,1:58 PM  LOS: 1 day

## 2019-05-07 NOTE — Evaluation (Signed)
Clinical/Bedside Swallow Evaluation Patient Details  Name: Paul Suarez MRN: [id number] Date of Birth: Sep 27, 1940  Today's Date: 05/07/2019 Time: SLP Start Time (ACUTE ONLY): 0845 SLP Stop Time (ACUTE ONLY): 0905 SLP Time Calculation (min) (ACUTE ONLY): 20 min  Past Medical History:  Past Medical History:  Diagnosis Date  . CAD (coronary artery disease)   . COPD (chronic obstructive pulmonary disease) ([place])   . Depression   . Essential hypertension   . Seizure disorder ([place])   . Staphylococcus aureus bacteremia 05/06/2019   Past Surgical History:  Past Surgical History:  Procedure Laterality Date  . BELOW KNEE LEG AMPUTATION Left    HPI:  Paul Suarez is a 79 y.o. gentleman with history of CAD and PVD s/p BKA, Severe centrilobular emphysema on home oxygen 2LNC, who lives in a SNF.He was previously diagnosed with COVID 19 pneumonia. Has a history of recurrent resistant ESBL producing organisms. Recent outpatient anitbiotics courses for concern for post-covid bacterial pneumonia. Having epistaxis which was refractory and prompted ED visit. He was started on CAP coverage with ceftriaxone/azithromycin and steroids for COPD exacerbation. Progressively worsening hypoxemia over the last 3 days PTA. Possible acute aspiration event (secretions?) and now on more oxygen. Also found to have blood cultures positive for Staph aureus - sensitivities pending.    Assessment / Plan / Recommendation Clinical Impression  Pt demonstrates possible oropharyngeal dysphagia; with initial sip sof water pt did ahve some immediate coughing. However, further trials with several oz did not elicit the same result. There are several possibilities, such as retained secretions or also inconcsistent sensation of aspiration. Instrumental testing is necessary for accurate diagnosis. I discussed this finding with his daughter who reported no prior history of dysphagia with liquids. She did report that she was very unhappy with  prior recommendations for pureed diet as it significantly decreased pts QOL and nutritional intake. She wants a balance between safety and comfort. Will proceed with MBS tomorrow for differential dx and best informed decision making, but pt can resume a regular diet and thin liquids in the meantime; his daughter calls in his orders and chooses appropriate soft textures.  SLP Visit Diagnosis: Dysphagia, oropharyngeal phase (R13.12)    Aspiration Risk  Moderate aspiration risk    Diet Recommendation Regular;Thin liquid   Liquid Administration via: Cup;Straw Medication Administration: Whole meds with puree Supervision: Staff to assist with self feeding Compensations: Slow rate;Small sips/bites Postural Changes: Seated upright at 90 degrees    Other  Recommendations Oral Care Recommendations: Oral care BID   Follow up Recommendations Skilled Nursing facility      Frequency and Duration            Prognosis        Swallow Study   General HPI: Paul Suarez is a 79 y.o. gentleman with history of CAD and PVD s/p BKA, Severe centrilobular emphysema on home oxygen 2LNC, who lives in a SNF.He was previously diagnosed with COVID 19 pneumonia. Has a history of recurrent resistant ESBL producing organisms. Recent outpatient anitbiotics courses for concern for post-covid bacterial pneumonia. Having epistaxis which was refractory and prompted ED visit. He was started on CAP coverage with ceftriaxone/azithromycin and steroids for COPD exacerbation. Progressively worsening hypoxemia over the last 3 days PTA. Possible acute aspiration event (secretions?) and now on more oxygen. Also found to have blood cultures positive for Staph aureus - sensitivities pending.  Type of Study: Bedside Swallow Evaluation Previous Swallow Assessment: DTR reports hx of puree diet with weight loss in the  past Temperature Spikes Noted: No Respiratory Status: Room air History of Recent Intubation: No Behavior/Cognition:  Alert;Cooperative;Pleasant mood Oral Cavity - Dentition: Edentulous Vision: Functional for self-feeding Self-Feeding Abilities: Able to feed self Patient Positioning: Upright in bed Baseline Vocal Quality: Wet Volitional Cough: Weak Volitional Swallow: Unable to elicit    Oral/Motor/Sensory Function Overall Oral Motor/Sensory Function: Within functional limits   Ice Chips Ice chips: Within functional limits   Thin Liquid Thin Liquid: Impaired Presentation: Cup;Straw Pharyngeal  Phase Impairments: Cough - Immediate    Nectar Thick Nectar Thick Liquid: Not tested   Honey Thick Honey Thick Liquid: Not tested   Puree Puree: Within functional limits   Solid     Solid: Within functional limits     Harlon Ditty, MA CCC-SLP  Acute Rehabilitation Services Pager [phone] Office [phone]  Paul Suarez, Paul Suarez 05/07/2019,10:42 AM

## 2019-05-07 NOTE — Progress Notes (Signed)
[facility]  Date of Admission:  05/02/2019      Total days of antibiotics 5    Day 5 Vancomycin           ASSESSMENT: Paul Suarez is a 79 y.o. male with a history of COVID pneumonia with (+) test 1/25; increased in oxygen from baseline 2 LPM to 3-4 lpm on 2/09. He has been treated for secondary community acquired pneumonia with extended course of Azithromycin and later Ceftriaxone injections prior to hospitalization. Now with worsening hypoxia, patchy multifocal infiltrates and staphylococcus aureus bacteremia in 1/4 bottles with sensitivities pending from 2/17.  With negative nasal MRSA PCR will d/c vancomycin an continue cefazolin alone for now. Follow up final micro tests. Repeat blood cultures are no growth. Likely secondary from pneumonia.  Transthoracic echo negative for vegetations. I worry about his respiratory status currently to recommend TEE for further evaluation at this time. Will order flutter valve to help with pulmonary toilet to clear secretions.    PLAN: 1. D/C vancomycin 2. Continue cefazolin 3. Hold on PICC line for now  4. Follow up micro  5. Flutter Valve   Principal Problem:   Staphylococcus aureus bacteremia Active Problems:   Multifocal pneumonia   CAD (coronary artery disease)   Essential hypertension   COPD with acute exacerbation (HCC)   Major depression, recurrent (HCC)   Seizure disorder (HCC)   Severe sepsis (HCC)   Acute on chronic respiratory failure with hypoxia (HCC)   AKI (acute kidney injury) (HCC)   Acute blood loss anemia    . divalproex  500 mg Oral QHS  . feeding supplement (ENSURE ENLIVE)  237 mL Oral BID BM  . guaiFENesin  1,200 mg Oral BID  . methylPREDNISolone (SOLU-MEDROL) injection  60 mg Intravenous Q6H  . multivitamin with minerals  1 tablet Oral Daily  . sertraline  25 mg Oral Daily  . sodium chloride flush  3 mL Intravenous Q12H  . sodium chloride flush  3 mL Intravenous Q12H  .  umeclidinium bromide  1 puff Inhalation Daily    SUBJECTIVE: He has a cough but a hard time clearing secretions.  Afebrile.  WBC 17.1K  Still with HFNC 15 LPM  Tachycardic with cough effort up to 120s Creatinine 0.61   Review of Systems: Review of Systems  Constitutional: Negative for chills, fever and malaise/fatigue.  HENT: Negative for sore throat.   Respiratory: Positive for cough and shortness of breath. Negative for sputum production.   Cardiovascular: Negative for chest pain and leg swelling.  Gastrointestinal: Negative for abdominal pain, diarrhea and vomiting.  Genitourinary: Negative for dysuria.  Musculoskeletal: Negative for back pain, joint pain and myalgias.  Skin: Negative for rash.  Neurological: Negative for weakness.    No Known Allergies  OBJECTIVE: Vitals:   05/07/19 0500 05/07/19 0600 05/07/19 0803 05/07/19 1220  BP: 104/64 116/63 119/84 116/70  Pulse: 96 97    Resp: 18 18    Temp:   98.3 F (36.8 C) 97.7 F (36.5 C)  TempSrc:   Oral Oral  SpO2: 100% 100%    Weight:      Height:       Body mass index is 18.11 kg/m.  Physical Exam Constitutional:      Appearance: Normal appearance.     Comments: Very soft spoken and difficult to hear him a majority of the conversation.   HENT:     Mouth/Throat:  Mouth: Mucous membranes are dry.  Eyes:     General: No scleral icterus.    Pupils: Pupils are equal, round, and reactive to light.  Cardiovascular:     Rate and Rhythm: Regular rhythm. Tachycardia present.     Pulses: Normal pulses.     Heart sounds: Murmur present.  Pulmonary:     Breath sounds: Rhonchi present.     Comments: Very rhonchous over anterior upper airway. No wheezing. POx reading 98% on HFNC Abdominal:     General: There is no distension.     Tenderness: There is no abdominal tenderness.  Musculoskeletal:        General: Normal range of motion.  Skin:    General: Skin is warm and dry.  Neurological:     Mental Status:  He is alert. He is disoriented.  Psychiatric:        Mood and Affect: Mood normal.     Lab Results Lab Results  Component Value Date   WBC 17.1 (H) 05/07/2019   HGB 8.2 (L) 05/07/2019   HCT 25.9 (L) 05/07/2019   MCV 93.8 05/07/2019   PLT 274 05/07/2019    Lab Results  Component Value Date   CREATININE 0.61 05/07/2019   BUN 27 (H) 05/07/2019   NA 139 05/07/2019   K 4.4 05/07/2019   CL 100 05/07/2019   CO2 27 05/07/2019    Lab Results  Component Value Date   ALT 14 05/03/2019   AST 16 05/03/2019   ALKPHOS 42 05/03/2019   BILITOT 0.5 05/03/2019     Microbiology: Recent Results (from the past 240 hour(s))  Blood Culture (routine x 2)     Status: Abnormal (Preliminary result)   Collection Time: 05/02/19  5:06 PM   Specimen: BLOOD  Result Value Ref Range Status   Specimen Description BLOOD BLOOD RIGHT FOREARM  Final   Special Requests   Final    BOTTLES DRAWN AEROBIC AND ANAEROBIC Blood Culture adequate volume   Culture  Setup Time   Final    ANAEROBIC BOTTLE ONLY GRAM POSITIVE COCCI IN CLUSTERS CRITICAL RESULT CALLED TO, READ BACK BY AND VERIFIED WITH: L SEAY PHARMD 05/05/19 0318 JDW    Culture (A)  Final    STAPHYLOCOCCUS AUREUS SUSCEPTIBILITIES TO FOLLOW REPEATING Performed at [facility], [place] [address] [place], [place] [address]    Report Status PENDING  Incomplete  Blood Culture (routine x 2)     Status: None   Collection Time: 05/02/19  5:08 PM   Specimen: BLOOD  Result Value Ref Range Status   Specimen Description BLOOD BLOOD LEFT FOREARM  Final   Special Requests   Final    BOTTLES DRAWN AEROBIC AND ANAEROBIC Blood Culture adequate volume   Culture   Final    NO GROWTH 5 DAYS Performed at [facility], [place] [address], [place], [place] [address]    Report Status 05/07/2019 FINAL  Final  Urine culture     Status: Abnormal   Collection Time: 05/02/19  5:08 PM   Specimen: In/Out Cath Urine  Result Value Ref Range Status   Specimen  Description IN/OUT CATH URINE  Final   Special Requests   Final    NONE Performed at [facility], [place] [address] [place],  [address]    Culture (A)  Final    5,000 COLONIES/mL ESCHERICHIA COLI Confirmed Extended Spectrum Beta-Lactamase Producer (ESBL).  In bloodstream infections from ESBL organisms, carbapenems are preferred over  piperacillin/tazobactam. They are shown to have a lower risk of mortality.    Report Status 05/04/2019 FINAL  Final   Organism ID, Bacteria ESCHERICHIA COLI (A)  Final      Susceptibility   Escherichia coli - MIC*    AMPICILLIN >=32 RESISTANT Resistant     CEFAZOLIN >=64 RESISTANT Resistant     CEFTRIAXONE >=64 RESISTANT Resistant     CIPROFLOXACIN >=4 RESISTANT Resistant     GENTAMICIN <=1 SENSITIVE Sensitive     IMIPENEM <=0.25 SENSITIVE Sensitive     NITROFURANTOIN <=16 SENSITIVE Sensitive     TRIMETH/SULFA <=20 SENSITIVE Sensitive     AMPICILLIN/SULBACTAM 16 INTERMEDIATE Intermediate     PIP/TAZO <=4 SENSITIVE Sensitive     * 5,000 COLONIES/mL ESCHERICHIA COLI  SARS CORONAVIRUS 2 (TAT 6-24 HRS) Nasopharyngeal Nasopharyngeal Swab     Status: Abnormal   Collection Time: 05/02/19  6:26 PM   Specimen: Nasopharyngeal Swab  Result Value Ref Range Status   SARS Coronavirus 2 POSITIVE (A) NEGATIVE Final    Comment: RESULT CALLED TO, READ BACK BY AND VERIFIED WITH: H.MASHBURN RN 2253 05/02/19 MCCORMICK K (NOTE) SARS-CoV-2 target nucleic acids are DETECTED. The SARS-CoV-2 RNA is generally detectable in upper and lower respiratory specimens during the acute phase of infection. Positive results are indicative of the presence of SARS-CoV-2 RNA. Clinical correlation with patient history and other diagnostic information is  necessary to determine patient infection status. Positive results do not rule out bacterial infection or co-infection with other viruses.  The expected result is Negative. Fact Sheet for Patients:  HairSlick.no Fact Sheet for Healthcare Providers: quierodirigir.com This test is not yet approved or cleared by the [place] FDA and  has been authorized for detection and/or diagnosis of SARS-CoV-2 by FDA under an Emergency Use Authorization (EUA). This EUA will remain  in effect (meaning this test can be used) for  the duration of the COVID-19 declaration under Section 564(b)(1) of the Act, 21 U.S.C. section 360bbb-3(b)(1), unless the authorization is terminated or revoked sooner. Performed at [facility], [address], [place], [place] [address]   MRSA PCR Screening     Status: None   Collection Time: 05/02/19  8:30 PM   Specimen: Nasopharyngeal  Result Value Ref Range Status   MRSA by PCR NEGATIVE NEGATIVE Final    Comment:        The GeneXpert MRSA Assay (FDA approved for NASAL specimens only), is one component of a comprehensive MRSA colonization surveillance program. It is not intended to diagnose MRSA infection nor to guide or monitor treatment for MRSA infections. Performed at [facility], [address], [place], [place] [address]   MRSA PCR Screening     Status: None   Collection Time: 05/05/19 11:06 AM   Specimen: Nasopharyngeal  Result Value Ref Range Status   MRSA by PCR NEGATIVE NEGATIVE Final    Comment:        The GeneXpert MRSA Assay (FDA approved for NASAL specimens only), is one component of a comprehensive MRSA colonization surveillance program. It is not intended to diagnose MRSA infection nor to guide or monitor treatment for MRSA infections. Performed at [facility], [address], [place], [place] [address]   Culture, blood (Routine X 2) w Reflex to ID Panel     Status: None (Preliminary result)   Collection Time: 05/06/19 11:59 AM   Specimen: BLOOD  Result Value Ref Range Status   Specimen Description BLOOD RIGHT ANTECUBITAL  Final   Special Requests   Final     BOTTLES DRAWN AEROBIC ONLY Blood Culture adequate volume   Culture   Final    NO GROWTH < 24 HOURS Performed at [facility], [address], [place], [place] [address]    Report Status PENDING  Incomplete  Culture, blood (Routine X 2) w Reflex to ID Panel     Status: None (Preliminary result)   Collection Time: 05/06/19 11:59 AM   Specimen: BLOOD LEFT FOREARM  Result Value Ref Range Status   Specimen Description BLOOD LEFT FOREARM  Final   Special Requests   Final    BOTTLES DRAWN AEROBIC ONLY Blood Culture adequate volume   Culture   Final    NO GROWTH < 24 HOURS Performed at [facility], [address] [place], [place] [address]    Report Status PENDING  Incomplete     Rexene Alberts, MSN, NP-C [facility]  [email or web address] Pager: [phone] Office: [phone] [facility] Main Line: [phone]

## 2019-05-07 NOTE — Progress Notes (Signed)
Pt had 11 beat run of SVT. Asymptomatic. Strip saved by CCMD.

## 2019-05-07 NOTE — Progress Notes (Signed)
  Echocardiogram 2D Echocardiogram has been performed.  Paul Suarez 05/07/2019, 12:28 PM

## 2019-05-07 NOTE — Plan of Care (Signed)

## 2019-05-07 NOTE — TOC Progression Note (Addendum)
Transition of Care [facility]) - Progression Note    Patient Details  Name: Paul Suarez MRN: [id number] Date of Birth: [birth year]  [facility]) CM/SW Contact  Paul Suarez, [place] Phone Number: 05/07/2019, 2:01 PM  Clinical Narrative:     Update:  Paul Suarez called back to choose Paul Suarez, CSW has informed facility of choice. Will need updated covid test prior to dc.   CSW called patient's daughter Paul Suarez to update on bed offers of [place] and [facility]. No answer, lvm. Pending call back at this time.   Expected Discharge Plan: Skilled Nursing Facility Barriers to Discharge: Continued Medical Work up  Expected Discharge Plan and Services Expected Discharge Plan: Skilled Nursing Facility     Post Acute Care Choice: Skilled Nursing Facility Living arrangements for the past 2 months: Skilled Nursing Facility([facility])                                       Social Determinants of Health (SDOH) Interventions    Readmission Risk Interventions No flowsheet data found.

## 2019-05-08 ENCOUNTER — Inpatient Hospital Stay (HOSPITAL_COMMUNITY): Payer: Medicare Other

## 2019-05-08 DIAGNOSIS — L8990 Pressure ulcer of unspecified site, unspecified stage: Secondary | ICD-10-CM | POA: Insufficient documentation

## 2019-05-08 LAB — BASIC METABOLIC PANEL
Anion gap: 14 (ref 5–15)
BUN: 30 mg/dL — ABNORMAL HIGH (ref 8–23)
CO2: 26 mmol/L (ref 22–32)
Calcium: 8.6 mg/dL — ABNORMAL LOW (ref 8.9–10.3)
Chloride: 99 mmol/L (ref 98–111)
Creatinine, Ser: 0.67 mg/dL (ref 0.61–1.24)
GFR calc Af Amer: >60 mL/min (ref >60)
GFR calc non Af Amer: >60 mL/min (ref >60)
Glucose, Bld: 172 mg/dL — ABNORMAL HIGH (ref 70–99)
Potassium: 3.7 mmol/L (ref 3.5–5.1)
Sodium: 139 mmol/L (ref 135–145)

## 2019-05-08 LAB — CULTURE, BLOOD (ROUTINE X 2): Special Requests: ADEQUATE

## 2019-05-08 LAB — CBC WITH DIFFERENTIAL/PLATELET
Abs Immature Granulocytes: 0.39 10*3/uL — ABNORMAL HIGH (ref 0.00–0.07)
Basophils Absolute: 0 10*3/uL (ref 0.0–0.1)
Basophils Relative: 0 %
Eosinophils Absolute: 0 10*3/uL (ref 0.0–0.5)
Eosinophils Relative: 0 %
HCT: 27 % — ABNORMAL LOW (ref 39.0–52.0)
Hemoglobin: 8.4 g/dL — ABNORMAL LOW (ref 13.0–17.0)
Immature Granulocytes: 2 %
Lymphocytes Relative: 11 %
Lymphs Abs: 2.2 10*3/uL (ref 0.7–4.0)
MCH: 29.8 pg (ref 26.0–34.0)
MCHC: 31.1 g/dL (ref 30.0–36.0)
MCV: 95.7 fL (ref 80.0–100.0)
Monocytes Absolute: 0.8 10*3/uL (ref 0.1–1.0)
Monocytes Relative: 4 %
Neutro Abs: 17.3 10*3/uL — ABNORMAL HIGH (ref 1.7–7.7)
Neutrophils Relative %: 83 %
Platelets: 369 10*3/uL (ref 150–400)
RBC: 2.82 MIL/uL — ABNORMAL LOW (ref 4.22–5.81)
RDW: 16.9 % — ABNORMAL HIGH (ref 11.5–15.5)
WBC: 20.7 10*3/uL — ABNORMAL HIGH (ref 4.0–10.5)
nRBC: 0.1 % (ref 0.0–0.2)

## 2019-05-08 LAB — PROTIME-INR
INR: 1.1 (ref 0.8–1.2)
Prothrombin Time: 14.1 seconds (ref 11.4–15.2)

## 2019-05-08 LAB — OCCULT BLOOD X 1 CARD TO LAB, STOOL: Fecal Occult Bld: POSITIVE — AB

## 2019-05-08 LAB — HEMOGLOBIN AND HEMATOCRIT, BLOOD
HCT: 25.7 % — ABNORMAL LOW (ref 39.0–52.0)
Hemoglobin: 8.1 g/dL — ABNORMAL LOW (ref 13.0–17.0)

## 2019-05-08 MED ORDER — ENSURE ENLIVE PO LIQD
237.0000 mL | Freq: Three times a day (TID) | ORAL | Status: DC
  Administered 2019-05-08 – 2019-05-11 (×9): 237 mL via ORAL

## 2019-05-08 MED ORDER — VANCOMYCIN HCL IN DEXTROSE 1-5 GM/200ML-% IV SOLN
1000.0000 mg | INTRAVENOUS | Status: DC
  Administered 2019-05-08 – 2019-05-11 (×4): 1000 mg via INTRAVENOUS
  Filled 2019-05-08 (×4): qty 200

## 2019-05-08 MED ORDER — PREDNISONE 20 MG PO TABS
40.0000 mg | ORAL_TABLET | Freq: Every day | ORAL | Status: DC
  Administered 2019-05-09 – 2019-05-11 (×3): 40 mg via ORAL
  Filled 2019-05-08 (×4): qty 2

## 2019-05-08 NOTE — Progress Notes (Signed)
Modified Barium Swallow Progress Note  Patient Details  Name: Paul Suarez MRN: [id number] Date of Birth: 06-13-1940  Today's Date: 05/08/2019  Modified Barium Swallow completed.  Full report located under Chart Review in the Imaging Section.  Brief recommendations include the following:  Clinical Impression  Pt demonstrates normal swallow function for age. Strength WNL, no significant residue. There are instances of slightly slower laryngeal elevation in the swallow, but again WNL for age and did not result in any penetration or aspiraiton. Pt able to masticate soft solids with extra time despite missing dentition. Recommend pt continue regular/thin unrestricted diet with family assist to choose appropriate foods, discussed with dtr. No SLP f/u needed, will sign off.    Swallow Evaluation Recommendations       SLP Diet Recommendations: Regular solids;Thin liquid   Liquid Administration via: Cup;Straw   Medication Administration: Whole meds with liquid   Supervision: Patient able to self feed   Compensations: Slow rate;Small sips/bites                Paul Suarez, Paul Suarez 05/08/2019,10:03 AM

## 2019-05-08 NOTE — Care Management Important Message (Signed)
Important Message  Patient Details  Name: Paul Suarez MRN: [id number] Date of Birth: [birth year]   Medicare Important Message Given:  Yes     Leone Haven, RN 05/08/2019, 4:57 PM

## 2019-05-08 NOTE — Progress Notes (Addendum)
Nutrition Follow-up  RD working remotely.  DOCUMENTATION CODES:   Not applicable  INTERVENTION:   -Increase Ensure Enlive po to TID, each supplement provides 350 kcal and 20 grams of protein -ContinueMVI with minerals daily -Continue Magic cup TID with meals, each supplement provides 290 kcal and 9 grams of protein  NUTRITION DIAGNOSIS:   Increased nutrient needs related to chronic illness(COPD) as evidenced by estimated needs.  Ongoing  GOAL:   Patient will meet greater than or equal to 90% of their needs  Unmet  MONITOR:   PO intake, Supplement acceptance, Labs, Weight trends, Skin, I & O's  REASON FOR ASSESSMENT:   Other (Comment)    ASSESSMENT:   Paul Suarez is a 79 y.o. male with medical history significant for emphysema with chronic hypoxic respiratory failure, seizure disorder, hypertension, depression, coronary artery disease, and recent COVID-19 infection, now presenting from his SNF for evaluation of epistaxis.  Patient had been diagnosed with Covid approximately 3-4 weeks ago per report, had been treated with multiple rounds of antibiotics, was seen in the emergency department yesterday for possible hemoptysis and had CTA chest that was negative for large central PE but concerning for increased patchy bilateral airspace disease suggestive of a multifocal pneumonia.  He then developed epistaxis today at his nursing facility, was started on Afrin nasal spray, initially had difficulty controlling the bleeding, and EMS was called.  The bleeding had stopped, but the patient was noted to be tachycardic with low blood pressure and increased oxygen requirements, and was brought into the ED.  Patient denies any chest pain, reports that his cough and shortness of breath has been worse recently, denies any melena or hematochezia, and denies any abdominal pain.  2/23- s/p MBSS- recommending regular diet with thin liquids  Reviewed I/O's: +214 ml x 24 hours and -342 ml since  admission  UOP: 525 ml x 24 hours  Attempted to speak with pt via phone, however, no answer.   Per SLP notes, pt has been placed on a pureed diet in the past, however, pt did not like the the diet and had very poor oral intake when he was prescribed it; pt daughter resting to avoid pureed diet if at all possible. Per discussion with RN yesterday, pt tolerating diet, however, consuming very little. Noted meal completion 0-25%. Per MAR, he is consuming Ensure Enlive supplements.   Plan to d/c to [facility] at discharge, per request of family.   Labs reviewed.   Diet Order:   Diet Order            Diet regular Room service appropriate? 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  Estimated Nutritional Needs:   Kcal:  1750-1950  Protein:  90-105 grams  Fluid:  > 1.7 L    05/10/19, RD, LDN, CDCES Registered Dietitian II Certified Diabetes Care and Education Specialist Please refer to [facility] for RD and/or  RD on-call/weekend/after hours pager

## 2019-05-08 NOTE — TOC Progression Note (Signed)
Transition of Care [facility]) - Progression Note    Patient Details  Name: Paul Suarez MRN: [id number] Date of Birth: 1940/07/22  [facility] CM/SW Contact  Gildardo Griffes, [place] Phone Number: 05/08/2019, 9:02 AM  Clinical Narrative:     CSW received all from [place] requesting patient's clinicals be faxed to [facility] at [phone] for them to assess if he can return and if they are able to meet patient's needs.   CSW has faxed clinicals to [place].   Expected Discharge Plan: Skilled Nursing Facility Barriers to Discharge: Continued Medical Work up  Expected Discharge Plan and Services Expected Discharge Plan: Skilled Nursing Facility     Post Acute Care Choice: Skilled Nursing Facility Living arrangements for the past 2 months: Skilled Nursing Facility([facility])                                       Social Determinants of Health (SDOH) Interventions    Readmission Risk Interventions No flowsheet data found.

## 2019-05-08 NOTE — Plan of Care (Signed)
Encourage patient to cough and deep breathe, attempting to wean Oxygen today from HF [place].  Patient has poor PO intake, encourage Ensure.

## 2019-05-08 NOTE — Progress Notes (Signed)
[facility]  Date of Admission:  05/02/2019      Total days of antibiotics 6    Day 6 Vancomycin           ASSESSMENT: Paul Suarez is a 79 y.o. male with a history of COVID pneumonia now worsening respiratory status/hypoxia and MRSA bacteremia in 1/4 bottles. WBC increased but has been getting steroids also. He does look better today - would check CXR again in AM to follow worsening R middle lung opacities. His cough effort does not seem very efficient - would push flutter valve for pulmonary hygiene (ordered yesterday but not at bedside).    Transthoracic echo negative for vegetations.   Cleared by speech with intact swallow function.     PLAN: 1. Resume Vancomycin  2. Follow blood cultures for PICC timing  3. Flutter valve  4. Consider CXR in AM    Principal Problem:   Staphylococcus aureus bacteremia Active Problems:   Multifocal pneumonia   CAD (coronary artery disease)   Essential hypertension   COPD with acute exacerbation (HCC)   Major depression, recurrent (HCC)   Seizure disorder (HCC)   Severe sepsis (HCC)   Acute on chronic respiratory failure with hypoxia (HCC)   AKI (acute kidney injury) (HCC)   Acute blood loss anemia    . divalproex  500 mg Oral QHS  . feeding supplement (ENSURE ENLIVE)  237 mL Oral TID BM  . guaiFENesin  1,200 mg Oral BID  . methylPREDNISolone (SOLU-MEDROL) injection  60 mg Intravenous Q6H  . multivitamin with minerals  1 tablet Oral Daily  . sertraline  25 mg Oral Daily  . sodium chloride flush  3 mL Intravenous Q12H  . sodium chloride flush  3 mL Intravenous Q12H  . umeclidinium bromide  1 puff Inhalation Daily    SUBJECTIVE: He is happy to see [place] this morning to let us know where he grew up and is very pleasant. He is coughing up sputum. Dry nose with the oxygen.  No other concerns.  Weaning down O2 - now 4 LPM. He is asking for his inhalers presently.   SLP evaluated today with intact  swallow function and no changes or restrictions to diet going forward.    Review of Systems: Review of Systems  Constitutional: Negative for chills, fever and malaise/fatigue.  HENT: Negative for sore throat.   Respiratory: Positive for cough and shortness of breath. Negative for sputum production.   Cardiovascular: Negative for chest pain and leg swelling.  Gastrointestinal: Negative for abdominal pain, diarrhea and vomiting.  Genitourinary: Negative for dysuria.  Musculoskeletal: Negative for back pain, joint pain and myalgias.  Skin: Negative for rash.  Neurological: Negative for weakness.    No Known Allergies  OBJECTIVE: Vitals:   05/08/19 0758 05/08/19 0800 05/08/19 1003 05/08/19 1156  BP:  (!) 91/56 127/70 107/63  Pulse:  89 93   Resp:  (!) 21 19   Temp: 97.9 F (36.6 C)   97.9 F (36.6 C)  TempSrc: Oral   Oral  SpO2:  100% 94%   Weight:      Height:       Body mass index is 17.56 kg/m.  Physical Exam Constitutional:      Appearance: Normal appearance.     Comments: Resting comfortably in bed watching TV   HENT:     Mouth/Throat:     Mouth: Mucous membranes are dry.  Eyes:  General: No scleral icterus.    Pupils: Pupils are equal, round, and reactive to light.  Cardiovascular:     Rate and Rhythm: Normal rate and regular rhythm.     Pulses: Normal pulses.  Pulmonary:     Effort: Pulmonary effort is normal.     Breath sounds: Examination of the right-upper field reveals rhonchi. Examination of the left-upper field reveals rhonchi. Examination of the left-middle field reveals wheezing. Examination of the left-lower field reveals wheezing. Wheezing and rhonchi present.  Abdominal:     General: There is no distension.     Tenderness: There is no abdominal tenderness.  Musculoskeletal:        General: Normal range of motion.  Skin:    General: Skin is warm and dry.  Neurological:     Mental Status: He is alert.     Comments: Pleasant. Seems more  oriented today.   Psychiatric:        Mood and Affect: Mood normal.     Lab Results Lab Results  Component Value Date   WBC 20.7 (H) 05/08/2019   HGB 8.4 (L) 05/08/2019   HCT 27.0 (L) 05/08/2019   MCV 95.7 05/08/2019   PLT 369 05/08/2019    Lab Results  Component Value Date   CREATININE 0.67 05/08/2019   BUN 30 (H) 05/08/2019   NA 139 05/08/2019   K 3.7 05/08/2019   CL 99 05/08/2019   CO2 26 05/08/2019    Lab Results  Component Value Date   ALT 14 05/03/2019   AST 16 05/03/2019   ALKPHOS 42 05/03/2019   BILITOT 0.5 05/03/2019     Microbiology: Recent Results (from the past 240 hour(s))  Blood Culture (routine x 2)     Status: Abnormal   Collection Time: 05/02/19  5:06 PM   Specimen: BLOOD  Result Value Ref Range Status   Specimen Description BLOOD BLOOD RIGHT FOREARM  Final   Special Requests   Final    BOTTLES DRAWN AEROBIC AND ANAEROBIC Blood Culture adequate volume   Culture  Setup Time   Final    ANAEROBIC BOTTLE ONLY GRAM POSITIVE COCCI IN CLUSTERS CRITICAL RESULT CALLED TO, READ BACK BY AND VERIFIED WITH: L SEAY PHARMD 05/05/19 5427 JDW Performed at [facility], [address] [place], [place] [address]    Culture METHICILLIN RESISTANT STAPHYLOCOCCUS AUREUS (A)  Final   Report Status 05/08/2019 FINAL  Final   Organism ID, Bacteria METHICILLIN RESISTANT STAPHYLOCOCCUS AUREUS  Final      Susceptibility   Methicillin resistant staphylococcus aureus - MIC*    CIPROFLOXACIN >=8 RESISTANT Resistant     ERYTHROMYCIN >=8 RESISTANT Resistant     GENTAMICIN <=0.5 SENSITIVE Sensitive     OXACILLIN >=4 RESISTANT Resistant     TETRACYCLINE <=1 SENSITIVE Sensitive     VANCOMYCIN 1 SENSITIVE Sensitive     TRIMETH/SULFA <=10 SENSITIVE Sensitive     CLINDAMYCIN <=0.25 SENSITIVE Sensitive     RIFAMPIN <=0.5 SENSITIVE Sensitive     Inducible Clindamycin NEGATIVE Sensitive     * METHICILLIN RESISTANT STAPHYLOCOCCUS AUREUS  Blood Culture (routine x 2)     Status:  None   Collection Time: 05/02/19  5:08 PM   Specimen: BLOOD  Result Value Ref Range Status   Specimen Description BLOOD BLOOD LEFT FOREARM  Final   Special Requests   Final    BOTTLES DRAWN AEROBIC AND ANAEROBIC Blood Culture adequate volume   Culture   Final    NO  GROWTH 5 DAYS Performed at [facility], [place] [address] [place], [place] [address]    Report Status 05/07/2019 FINAL  Final  Urine culture     Status: Abnormal   Collection Time: 05/02/19  5:08 PM   Specimen: In/Out Cath Urine  Result Value Ref Range Status   Specimen Description IN/OUT CATH URINE  Final   Special Requests   Final    NONE Performed at [facility], [place] [address] [place], [place] [address]    Culture (A)  Final    5,000 COLONIES/mL ESCHERICHIA COLI Confirmed Extended Spectrum Beta-Lactamase Producer (ESBL).  In bloodstream infections from ESBL organisms, carbapenems are preferred over piperacillin/tazobactam. They are shown to have a lower risk of mortality.    Report Status 05/04/2019 FINAL  Final   Organism ID, Bacteria ESCHERICHIA COLI (A)  Final      Susceptibility   Escherichia coli - MIC*    AMPICILLIN >=32 RESISTANT Resistant     CEFAZOLIN >=64 RESISTANT Resistant     CEFTRIAXONE >=64 RESISTANT Resistant     CIPROFLOXACIN >=4 RESISTANT Resistant     GENTAMICIN <=1 SENSITIVE Sensitive     IMIPENEM <=0.25 SENSITIVE Sensitive     NITROFURANTOIN <=16 SENSITIVE Sensitive     TRIMETH/SULFA <=20 SENSITIVE Sensitive     AMPICILLIN/SULBACTAM 16 INTERMEDIATE Intermediate     PIP/TAZO <=4 SENSITIVE Sensitive     * 5,000 COLONIES/mL ESCHERICHIA COLI  SARS CORONAVIRUS 2 (TAT 6-24 HRS) Nasopharyngeal Nasopharyngeal Swab     Status: Abnormal   Collection Time: 05/02/19  6:26 PM   Specimen: Nasopharyngeal Swab  Result Value Ref Range Status   SARS Coronavirus 2 POSITIVE (A) NEGATIVE Final    Comment: RESULT CALLED TO, READ BACK BY AND VERIFIED WITH: H.MASHBURN RN [id number] 05/02/19 MCCORMICK  K (NOTE) SARS-CoV-2 target nucleic acids are DETECTED. The SARS-CoV-2 RNA is generally detectable in upper and lower respiratory specimens during the acute phase of infection. Positive results are indicative of the presence of SARS-CoV-2 RNA. Clinical correlation with patient history and other diagnostic information is  necessary to determine patient infection status. Positive results do not rule out bacterial infection or co-infection with other viruses.  The expected result is Negative. Fact Sheet for Patients: SugarRoll.be Fact Sheet for Healthcare Providers: https://www.woods-mathews.com/ This test is not yet approved or cleared by the [place] FDA and  has been authorized for detection and/or diagnosis of SARS-CoV-2 by FDA under an Emergency Use Authorization (EUA). This EUA will remain  in effect (meaning this test can be used) for  the duration of the COVID-19 declaration under Section 564(b)(1) of the Act, 21 U.S.C. section 360bbb-3(b)(1), unless the authorization is terminated or revoked sooner. Performed at [facility], [place] [address] [place], [place] [address]   MRSA PCR Screening     Status: None   Collection Time: 05/02/19  8:30 PM   Specimen: Nasopharyngeal  Result Value Ref Range Status   MRSA by PCR NEGATIVE NEGATIVE Final    Comment:        The GeneXpert MRSA Assay (FDA approved for NASAL specimens only), is one component of a comprehensive MRSA colonization surveillance program. It is not intended to diagnose MRSA infection nor to guide or monitor treatment for MRSA infections. Performed at [facility], [place] [address] [place], [place] [address]   MRSA PCR Screening     Status: None   Collection Time: 05/05/19 11:06 AM   Specimen: Nasopharyngeal  Result  Value Ref Range Status   MRSA by PCR NEGATIVE NEGATIVE Final    Comment:        The GeneXpert MRSA Assay (FDA approved for NASAL  specimens only), is one component of a comprehensive MRSA colonization surveillance program. It is not intended to diagnose MRSA infection nor to guide or monitor treatment for MRSA infections. Performed at [facility], [address], [place], [place] [address]   Culture, blood (Routine X 2) w Reflex to ID Panel     Status: None (Preliminary result)   Collection Time: 05/06/19 11:59 AM   Specimen: BLOOD  Result Value Ref Range Status   Specimen Description BLOOD RIGHT ANTECUBITAL  Final   Special Requests   Final    BOTTLES DRAWN AEROBIC ONLY Blood Culture adequate volume   Culture   Final    NO GROWTH 2 DAYS Performed at [facility], [address], [place], [place] [address]    Report Status PENDING  Incomplete  Culture, blood (Routine X 2) w Reflex to ID Panel     Status: None (Preliminary result)   Collection Time: 05/06/19 11:59 AM   Specimen: BLOOD LEFT FOREARM  Result Value Ref Range Status   Specimen Description BLOOD LEFT FOREARM  Final   Special Requests   Final    BOTTLES DRAWN AEROBIC ONLY Blood Culture adequate volume   Culture   Final    NO GROWTH 2 DAYS Performed at [facility], [address] [place], [place] [address]    Report Status PENDING  Incomplete     Rexene Alberts, MSN, NP-C [facility]  [email or web address] Pager: [phone] Office: [phone] [facility] Main Line: [phone]

## 2019-05-08 NOTE — Progress Notes (Signed)
PROGRESS NOTE  Eliyohu Class EXB:[id number] DOB: [birth year] DOA: 05/02/2019 PCP: Pecola Lawless, MD  Brief History    Georges Victorio is a 79 y.o. male with medical history significant for emphysema with chronic hypoxic respiratory failure with a 2L O2 requirement at baseline, seizure disorder, hypertension, depression, coronary artery disease, and recent COVID-19 infection, now presenting from his SNF for evaluation of epistaxis.  Patient had been diagnosed with Covid approximately 3-4 weeks ago per report, had been treated with multiple rounds of antibiotics, was seen in the emergency department yesterday for possible hemoptysis and had CTA chest that was negative for large central PE but concerning for increased patchy bilateral airspace disease suggestive of a multifocal pneumonia.  He then developed epistaxis today at his nursing facility, was started on Afrin nasal spray, initially had difficulty controlling the bleeding, and EMS was called.  The bleeding had stopped, but the patient was noted to be tachycardic with low blood pressure and increased oxygen requirements, and was brought into the ED.  Patient denies any chest pain, reports that his cough and shortness of breath has been worse recently, denies any melena or hematochezia, and denies any abdominal pain.   ED Course: Upon arrival to the ED, patient is found to be afebrile, saturating mid 80s on 3 L/min of supplemental oxygen, tachypneic, tachycardic to 130, and with blood pressure 86/61.  EKG features sinus tachycardia with rate 123 and ST abnormalities that appear similar to priors.  Chest x-ray demonstrates interval worsening in bilateral pulmonary airspace disease.  Chemistry panel notable for BUN of 54, up from 26 yesterday.  Albumin is only 1.9.  CBC features a new leukocytosis to 32,200 and anemia with hemoglobin 9.0, down from 11.5 yesterday.  Initial lactic acid is 3.7.  Patient was given 2 L of lactated Ringer's, cefepime, azithromycin,  and vancomycin in the ED.  Is also treated with albuterol in the emergency department, blood and urine cultures were ordered.  COVID-19 was tested in the ED. It was positive, as it would be expected to be. The patient initially tested positive on 04/09/2019. So, he is outside the 21 day period when he would require isolation.  [facility] were consulted to admit the patient for further evaluation and treatment.  The patient is receiving IV steroids which are being tapered down, nebulizer treatments, and rocephin and azithromycin. He received IV fluids for correction of his acute renal insufficiency. He is receiving supplemental O2. Nutrition has been consulted for evaluation and treatment of his malnourished status. His oxygen requirements remain high.  Due to worsening cough, increasing oxygen requirements and 1/4 blood cultures + for GPC in clusters I have changed the patient from Rocephin and azithromycin to Zosyn. Urine culture has grown out e. Coli. Organism grown out of blood culture has been identified as staph aureus. Sensitivities have confirmed the presence of MRSA.  Antibiotics have been changed to Vancomycin and ancef per infectious disease.  Rapid Response called on afternoon of 05/06/2019 with increase of oxygen requirements to 15L. CXR demonstrated worsening pneumonia.Family updated. Code Status changed to DNR. Lasix IV given. Pulmonary critical care consulted.    Consultants  . None  Procedures  . None  Antibiotics   Anti-infectives (From admission, onward)   Start     Dose/Rate Route Frequency Ordered Stop   05/08/19 1000  vancomycin (VANCOCIN) IVPB 1000 mg/200 mL premix     1,000 mg 200 mL/hr over 60 Minutes Intravenous Every 24 hours 05/08/19 0834  05/07/19 0200  vancomycin (VANCOREADY) IVPB 750 mg/150 mL  Status:  Discontinued     750 mg 150 mL/hr over 60 Minutes Intravenous Every 12 hours 05/06/19 1340 05/07/19 1621   05/06/19 2300  vancomycin (VANCOREADY) IVPB  750 mg/150 mL  Status:  Discontinued     750 mg 150 mL/hr over 60 Minutes Intravenous Every 12 hours 05/06/19 1018 05/06/19 1340   05/06/19 1415  vancomycin (VANCOREADY) IVPB 1250 mg/250 mL     1,250 mg 166.7 mL/hr over 90 Minutes Intravenous  Once 05/06/19 1340 05/06/19 1936   05/06/19 1415  ceFAZolin (ANCEF) IVPB 2g/100 mL premix  Status:  Discontinued     2 g 200 mL/hr over 30 Minutes Intravenous Every 8 hours 05/06/19 1340 05/08/19 0828   05/06/19 1230  ceFAZolin (ANCEF) IVPB 2g/100 mL premix  Status:  Discontinued     2 g 200 mL/hr over 30 Minutes Intravenous Every 8 hours 05/06/19 1200 05/06/19 1340   05/06/19 1030  vancomycin (VANCOREADY) IVPB 1250 mg/250 mL  Status:  Discontinued     1,250 mg 166.7 mL/hr over 90 Minutes Intravenous  Once 05/06/19 0955 05/06/19 1339   05/05/19 1530  piperacillin-tazobactam (ZOSYN) IVPB 3.375 g  Status:  Discontinued     3.375 g 12.5 mL/hr over 240 Minutes Intravenous Every 8 hours 05/05/19 1454 05/06/19 1147   05/05/19 1200  piperacillin-tazobactam (ZOSYN) IVPB 3.375 g  Status:  Discontinued     3.375 g 100 mL/hr over 30 Minutes Intravenous Every 6 hours 05/05/19 1100 05/05/19 1451   05/04/19 1715  azithromycin (ZITHROMAX) 500 mg in sodium chloride 0.9 % 250 mL IVPB  Status:  Discontinued     500 mg 250 mL/hr over 60 Minutes Intravenous Every 24 hours 05/04/19 1030 05/05/19 1100   05/04/19 1715  cefTRIAXone (ROCEPHIN) 2 g in sodium chloride 0.9 % 100 mL IVPB  Status:  Discontinued     2 g 200 mL/hr over 30 Minutes Intravenous Every 24 hours 05/04/19 1030 05/05/19 1100   05/03/19 1715  cefTRIAXone (ROCEPHIN) 2 g in sodium chloride 0.9 % 100 mL IVPB  Status:  Discontinued     2 g 200 mL/hr over 30 Minutes Intravenous Every 24 hours 05/02/19 2024 05/04/19 1030   05/03/19 1715  azithromycin (ZITHROMAX) 500 mg in sodium chloride 0.9 % 250 mL IVPB  Status:  Discontinued     500 mg 250 mL/hr over 60 Minutes Intravenous Every 24 hours 05/02/19 2024  05/04/19 1030   05/03/19 0600  vancomycin (VANCOREADY) IVPB 750 mg/150 mL  Status:  Discontinued     750 mg 150 mL/hr over 60 Minutes Intravenous Every 12 hours 05/02/19 1735 05/02/19 1933   05/02/19 1715  vancomycin (VANCOCIN) IVPB 1000 mg/200 mL premix     1,000 mg 200 mL/hr over 60 Minutes Intravenous  Once 05/02/19 1705 05/02/19 1938   05/02/19 1715  ceFEPIme (MAXIPIME) 2 g in sodium chloride 0.9 % 100 mL IVPB     2 g 200 mL/hr over 30 Minutes Intravenous  Once 05/02/19 1705 05/02/19 1938   05/02/19 1715  azithromycin (ZITHROMAX) 500 mg in sodium chloride 0.9 % 250 mL IVPB     500 mg 250 mL/hr over 60 Minutes Intravenous  Once 05/02/19 1705 05/02/19 1858     Subjective  The patient is resting comfortably. He is in good spirits today. No new complaints. Oxygen requirements are down to 6L. The patient is currently saturating 94%.  Objective   Vitals:  Vitals:   05/08/19  1200 05/08/19 1300  BP: 108/64 112/65  Pulse: (!) 108 (!) 108  Resp: (!) 23 (!) 23  Temp:  97.9 F (36.6 C)  SpO2: 95% 92%   Exam:  Constitutional:  . The patient is awake, alert, and oriented x 3. No acute distress. Respiratory:  . No increased work of breathing. Marland Kitchen Positive for small rales at right base and rhonchi. No wheezes. . No tactile fremitus Cardiovascular:  . Regular rate and rhythm . No murmurs, ectopy, or gallups. . No lateral PMI. No thrills. Abdomen:  . Abdomen is soft, non-tender, non-distended . No hernias, masses, or organomegaly . Normoactive bowel sounds.  Musculoskeletal:  . No cyanosis, clubbing, or edema Skin:  . No rashes, lesions, ulcers . palpation of skin: no induration or nodules Neurologic:  . CN 2-12 intact . Sensation all 4 extremities intact Psychiatric:  . Mental status o Mood, affect appropriate o Orientation to person, place, time  . judgment and insight appear intact  I have personally reviewed the following:   Today's Data  . Vitals, CBC, CMP . 1/4  blood cultures positive for MRSA.   Marland Kitchen Urine culture positive for E. coli  Imaging  . CTA chest . CXR 05/04/2019  Scheduled Meds: . divalproex  500 mg Oral QHS  . feeding supplement (ENSURE ENLIVE)  237 mL Oral TID BM  . guaiFENesin  1,200 mg Oral BID  . methylPREDNISolone (SOLU-MEDROL) injection  60 mg Intravenous Q6H  . multivitamin with minerals  1 tablet Oral Daily  . sertraline  25 mg Oral Daily  . sodium chloride flush  3 mL Intravenous Q12H  . sodium chloride flush  3 mL Intravenous Q12H  . umeclidinium bromide  1 puff Inhalation Daily   Continuous Infusions: . sodium chloride    . vancomycin 1,000 mg (05/08/19 1104)    Principal Problem:   Staphylococcus aureus bacteremia Active Problems:   Multifocal pneumonia   CAD (coronary artery disease)   Essential hypertension   COPD with acute exacerbation (HCC)   Major depression, recurrent (HCC)   Seizure disorder (HCC)   Severe sepsis (HCC)   Acute on chronic respiratory failure with hypoxia (HCC)   AKI (acute kidney injury) (HCC)   Acute blood loss anemia   Pressure injury of skin   LOS: 6 days   A & P  Sepsis secondary to PNA : Hypotension, tachycardia, leukocytosis, lactic acidosis, AKI, and increased oxygen requirements. CXR with worsening bilateral airspace disease. Blood cultures x 2 were drawn in the ED.  Urinalysis is positive for UTI. Urine culture has grown out E. Coli. Sensitivities are pending. They patient has been started on Rocephin and azithromycin. Blood pressures are improved, but the patient remains tachycardic. Leukocytosis is decreased as is lactic acid. Oxygen requirements are increased. Today lung sounds were very coarse and oxygen requirements are increased. CXR performed on 05/04/2019 demonstrated worsening consolidate at the right base. The patient has increasing oxygen requirements. He is now requiring 15L HFNC to maintain saturations in of 100%.  Blood cultures 1/4 have grown out MRSA and Urine  culture has grown out E. Coli. I have stopped rocephin and azithromycin. I have started zosyn. Will add guaifenesin, incentive spirometry, and flutter valve. As 1/4 blood cultures is now known to have grown out MRSA, infectious disease has changed his antibiotics to Vancomycin and ancef. Echocardiogram was obtained on 05/06/2019 to guide therapy. It demonstrated EF of 60-65% with no regional wall motion abnormalities and Grade I diastolic dysfuntion. RV systolic  function is nomal and RV size is normal. There is moderate calcification of the mitral valve leaflet. No mitral stenosis or regurgitation. There is no aortic stenosis. No valvular vegetations were seen.  Acute kidney injury: Creatinine is down to 0.67 today. SCr was 1.09 in ED, up from 0.55 prior to admission.  The creatinine was elevated due to prerenal azotemia in setting of hypotension. The patient was fluid-resuscitated in ED. Will monitor creatinine, electrolytes, and volume status. Avoid nephrotoxic substances and hypotension. Antihypertensives are held. Continue IV fluids.  Acute hypoxic respiratory failure: Improving. The patient's oxygen requirements have decreased to 6L by nasal cannula from 15 L yesterday. CXR from 05/06/2019 demonstrates a worsening of the appearance of the pneumonia.Echocardiogram was obtained on 05/06/2019 to guide therapy. It demonstrated EF of 60-65% with no regional wall motion abnormalities and Grade I diastolic dysfuntion. RV systolic function is nomal and RV size is normal. There is moderate calcification of the mitral valve leaflet. No mitral stenosis or regurgitation. There is no aortic stenosis. No valvular vegetations were seen.   Epistaxis and/or hemoptysis: Anemia has dropped to 8.4 from 11.5 on 03/31/2019. Epistaxis has stopped. Monitor and transfuse for hemoglobin less than 7.0. Platelets are normal, but antiplatelet agents are held.  Iron studies reveal severe iron deficiency. I will supplement. Monitor  anemia.   COPD with acute exacerbation: Exacerbation due to pneumonia. Continue albuterol, Spiriva, and systemic steroids with IV antibiotics. Sputum cultures pending.  CAD: Noted. Appears stable. No anginal complaints. Antiplatelet agents are pending.    Seizure disorder: Continue Depakote. Monitor.     Recent COVID-19 infection: Patient was reportedly diagnosed with COVID on 04/09/19 as documented in progress note from his nursing home that same day. I confirmed with our Infection Prevention dept that since it has been >21 days but <90 days. So, despite positive test result from the ED, he should not be on special precautions or in a COVID unit. The Infection Prevention dept stated that they would also put a note in his chart to this effect. The current respiratory sxs are suspected to be secondary to a bacterial PNA which may be sequela of the recent COVID.   Malnutrition: Moderate and likely chronic. Pt is receiving nutritional supplements. I appreciate dietician's assistance.  I have seen and examined this patient myself. I have spent 35 minutes in his evaluation and care.    DVT prophylaxis: SCD's  Code Status: Full, confirmed with patient  Family Communication: Discussed with patient  Disposition Plan: Will likely require SNF on discharge once sepsis resolved, respiratory status improved and stable, and bleeding/anemia stabilized.   Terryl Molinelli, DO [facility] Direct contact: see [email or web address]  7PM-7AM contact night coverage as above 05/08/2019,2:27 PM  LOS: 1 day

## 2019-05-08 NOTE — Progress Notes (Signed)
Physical Therapy Treatment Patient Details Name: Paul Suarez MRN: [id number] DOB: 11/25/1940 Today's Date: 05/08/2019    History of Present Illness Patient is a 79 y/o male who presents from [facility] with epistaxis. Admitted with sepsis secondary to PNA. PMH includes L BKA, seizures, HTN, depression, CAd, COPD,  emphysema with chronic hypoxic respiratory failure with a 2L O2 requirement at baseline, recent Covid-10 infection.    PT Comments    Pt declined getting to chair.  Required mod A of 2 to get to EOB.  Session focused on improving EOB balance.  Pt initially needing max A but with multimodal cues and positioning pt able to maintain EOB balance for 10 mins with close SBA.  VSS this session on 6 LPM O2.    Follow Up Recommendations  SNF     Equipment Recommendations  None recommended by PT    Recommendations for Other Services       Precautions / Restrictions Precautions Precautions: Fall Precaution Comments: watch 02 and HR    Mobility  Bed Mobility Overal bed mobility: Needs Assistance Bed Mobility: Supine to Sit;Sit to Supine     Supine to sit: Mod assist;+2 for physical assistance Sit to supine: +2 for physical assistance;Mod assist   General bed mobility comments: Assist to bring LEs to EOB, scoot bottom and elevate trunk; manual assist to reach for rail.  Transfers Overall transfer level: Needs assistance               General transfer comment: deferred due to pt declined transfer to chair  Ambulation/Gait                 Stairs             Wheelchair Mobility    Modified Rankin (Stroke Patients Only)       Balance Overall balance assessment: Needs assistance Sitting-balance support: Bilateral upper extremity supported Sitting balance-Leahy Scale: Fair Sitting balance - Comments: Initially max A for static sitting with posterior lean.  Worked on positioning and multimodal cues for pt to lean forward.  Had pt hold therapist hands  out in front and then able to progress hands back to his knees and bed with close SBA for balance.  Once balanced, sat EOB for 10 minutes with occasional min A.  Required frequent cues for balance/posture.  Worked on reaching forward with both hands x 5.  Had pt wash face while sitting EOB with SBA.                                    Cognition Arousal/Alertness: Awake/alert Behavior During Therapy: WFL for tasks assessed/performed Overall Cognitive Status: Impaired/Different from baseline Area of Impairment: Orientation;Following commands;Memory;Problem solving                 Orientation Level: Disoriented to;Situation;Time   Memory: Decreased short-term memory Following Commands: Follows one step commands with increased time;Follows one step commands inconsistently     Problem Solving: Slow processing;Decreased initiation;Difficulty sequencing;Requires verbal cues;Requires tactile cues General Comments: Pt required repetition and frequent multimodal cues.      Exercises      General Comments General comments (skin integrity, edema, etc.): Pt on 6 L min O2 with HR 100-117 bpm during PT and O2 sats 90% or > with activity      Pertinent Vitals/Pain Pain Assessment: No/denies pain    Home Living  Prior Function            PT Goals (current goals can now be found in the care plan section) Progress towards PT goals: Progressing toward goals    Frequency    Min 2X/week      PT Plan Current plan remains appropriate    Co-evaluation              AM-PAC PT "6 Clicks" Mobility   Outcome Measure  Help needed turning from your back to your side while in a flat bed without using bedrails?: A Lot Help needed moving from lying on your back to sitting on the side of a flat bed without using bedrails?: Total Help needed moving to and from a bed to a chair (including a wheelchair)?: Total Help needed standing up from a  chair using your arms (e.g., wheelchair or bedside chair)?: Total Help needed to walk in hospital room?: Total Help needed climbing 3-5 steps with a railing? : Total 6 Click Score: 7    End of Session Equipment Utilized During Treatment: Oxygen Activity Tolerance: Patient tolerated treatment well Patient left: (Right sidelying , heels floating) Nurse Communication: Mobility status PT Visit Diagnosis: Muscle weakness (generalized) (M62.81)     Time: 2956-2130 PT Time Calculation (min) (ACUTE ONLY): 22 min  Charges:  $Neuromuscular Re-education: 8-22 mins                     Maggie Font, PT Acute Rehab Services Pager [phone] [facility] [phone] [facility] 05/08/2019, 4:32 PM

## 2019-05-08 NOTE — Progress Notes (Addendum)
Pharmacy Antibiotic Note  Paul Suarez is a 79 y.o. male admitted on 05/02/2019 with MRSA bacteremia.  Pharmacy has been consulted for Vancomycin dosing.  Patient is currently afebrile with elevated WBC (20.7) while receiving IV steroids. Renal function is stable.   Assessment: Vancomycin stopped briefly yesterday, but likely still in patient system. Will continue with maintenance dose.  Plan: Start vancomycin 1000 mg IV Q 24 hrs  (Scr 1.0, Vd 0.72 L, est AUC 538.0) Monitor cultures and sensitivities, renal function, and clinical progression Check Vancomycin levels at steady state  Height: 5\' 11"  (180.3 cm) Weight: 125 lb 14.1 oz (57.1 kg) IBW/kg (Calculated) : 75.3  Temp (24hrs), Avg:97.8 F (36.6 C), Min:97.5 F (36.4 C), Max:97.9 F (36.6 C)  Recent Labs  Lab 05/02/19 1647 05/02/19 1647 05/02/19 1911 05/02/19 2350 05/03/19 0218 05/03/19 1058 05/03/19 2257 05/04/19 0434 05/06/19 0359 05/07/19 0225 05/08/19 0456  WBC 32.2*   < >  --  23.5*  --    < > 15.8* 15.7* 16.2* 17.1* 20.7*  CREATININE 1.09   < >  --   --  0.76  --   --  0.70 0.66 0.61 0.67  LATICACIDVEN 3.7*  --  2.3* 1.8 1.8  --   --   --   --   --   --    < > = values in this interval not displayed.    Estimated Creatinine Clearance: 61.5 mL/min (by C-G formula based on SCr of 0.67 mg/dL).    No Known Allergies  Antimicrobials this admission: Vancomycin 2/17, 2/21 >> Cefepime 2/17 x1 Azithromycin 2/17 >> 2/20 Ceftriaxone 2/18 >> 2/20 Zosyn 2/20 >>2/21 Cefazolin 2/21 >> 2/23  Dose adjustments this admission: N/A  Microbiology results: 2/17 BCx: 1/4 Staph Aureus 2/17 UCx: ESBL Ecoli (likely colonization)  2/20 MRSA PCR: neg 2/23 BCx: MRSA   3/23, PharmD Candidate 05/08/2019  8:35 AM  Please check [email or web address] for unit-specific pharmacy phone numbers.

## 2019-05-09 ENCOUNTER — Inpatient Hospital Stay (HOSPITAL_COMMUNITY): Payer: Medicare Other

## 2019-05-09 ENCOUNTER — Inpatient Hospital Stay: Payer: Self-pay

## 2019-05-09 LAB — CBC WITH DIFFERENTIAL/PLATELET
Abs Immature Granulocytes: 0.3 10*3/uL — ABNORMAL HIGH (ref 0.00–0.07)
Basophils Absolute: 0 10*3/uL (ref 0.0–0.1)
Basophils Relative: 0 %
Eosinophils Absolute: 0 10*3/uL (ref 0.0–0.5)
Eosinophils Relative: 0 %
HCT: 25.5 % — ABNORMAL LOW (ref 39.0–52.0)
Hemoglobin: 7.9 g/dL — ABNORMAL LOW (ref 13.0–17.0)
Immature Granulocytes: 2 %
Lymphocytes Relative: 10 %
Lymphs Abs: 1.5 10*3/uL (ref 0.7–4.0)
MCH: 30.3 pg (ref 26.0–34.0)
MCHC: 31 g/dL (ref 30.0–36.0)
MCV: 97.7 fL (ref 80.0–100.0)
Monocytes Absolute: 0.7 10*3/uL (ref 0.1–1.0)
Monocytes Relative: 4 %
Neutro Abs: 13.1 10*3/uL — ABNORMAL HIGH (ref 1.7–7.7)
Neutrophils Relative %: 84 %
Platelets: 358 10*3/uL (ref 150–400)
RBC: 2.61 MIL/uL — ABNORMAL LOW (ref 4.22–5.81)
RDW: 18.2 % — ABNORMAL HIGH (ref 11.5–15.5)
WBC: 15.6 10*3/uL — ABNORMAL HIGH (ref 4.0–10.5)
nRBC: 0 % (ref 0.0–0.2)

## 2019-05-09 LAB — COMPREHENSIVE METABOLIC PANEL
ALT: 10 U/L (ref 0–44)
AST: 17 U/L (ref 15–41)
Albumin: 2.1 g/dL — ABNORMAL LOW (ref 3.5–5.0)
Alkaline Phosphatase: 42 U/L (ref 38–126)
Anion gap: 6 (ref 5–15)
BUN: 31 mg/dL — ABNORMAL HIGH (ref 8–23)
CO2: 30 mmol/L (ref 22–32)
Calcium: 8.2 mg/dL — ABNORMAL LOW (ref 8.9–10.3)
Chloride: 103 mmol/L (ref 98–111)
Creatinine, Ser: 0.46 mg/dL — ABNORMAL LOW (ref 0.61–1.24)
GFR calc Af Amer: >60 mL/min (ref >60)
GFR calc non Af Amer: >60 mL/min (ref >60)
Glucose, Bld: 126 mg/dL — ABNORMAL HIGH (ref 70–99)
Potassium: 4.4 mmol/L (ref 3.5–5.1)
Sodium: 139 mmol/L (ref 135–145)
Total Bilirubin: 0.5 mg/dL (ref 0.3–1.2)
Total Protein: 5.1 g/dL — ABNORMAL LOW (ref 6.5–8.1)

## 2019-05-09 LAB — HEMOGLOBIN AND HEMATOCRIT, BLOOD
HCT: 28.9 % — ABNORMAL LOW (ref 39.0–52.0)
HCT: 26.5 % — ABNORMAL LOW (ref 39.0–52.0)
Hemoglobin: 9.2 g/dL — ABNORMAL LOW (ref 13.0–17.0)
Hemoglobin: 8.5 g/dL — ABNORMAL LOW (ref 13.0–17.0)

## 2019-05-09 LAB — VITAMIN B1: Vitamin B1 (Thiamine): 117 nmol/L (ref 66.5–200.0)

## 2019-05-09 NOTE — Progress Notes (Signed)
Pt MEWS is Red due to pulse and respiration.  Pt was doing calisthenic exercises ( arm and leg lift) when staff entered the room.   Staff attempted to explain to pt he has pneumonia and doing exercise is raising his heart rate.    Raised O2 from 6 to 7 for comfort.  Will continue to monitor.

## 2019-05-09 NOTE — Evaluation (Signed)
Occupational Therapy Evaluation Patient Details Name: Paul Suarez MRN: [id number] DOB: 1940-05-16 Today's Date: 05/09/2019    History of Present Illness Patient is a 79 y/o male who presents from [place] with epistaxis. Admitted with sepsis secondary to PNA. PMH includes L BKA, seizures, HTN, depression, CAd, COPD,  emphysema with chronic hypoxic respiratory failure with a 2L O2 requirement at baseline, recent Covid-10 infection.   Clinical Impression   Pt PTA: Per chart, pt was performing ADL with minimal assist from staff and transferring in/out of w/c independently, but in the past 3 weeks has been requiring increased care and in bed x3 weeks. Pt maxA for ADL at this time and severe weakness at EOB with posterior lean. Pt HOH and requires multimodal cues to initiate tasks. Pt appears to be slightly confused at this time. Pt would benefit from continued OT skilled services for ADL, mobility and safety in SNF setting. OT following acutely.    Follow Up Recommendations  SNF    Equipment Recommendations  None recommended by OT    Recommendations for Other Services       Precautions / Restrictions Precautions Precautions: Fall Precaution Comments: watch 02 and HR Restrictions Weight Bearing Restrictions: No      Mobility Bed Mobility Overal bed mobility: Needs Assistance Bed Mobility: Supine to Sit;Sit to Supine     Supine to sit: Max assist;Total assist Sit to supine: Max assist;Total assist   General bed mobility comments: Assist for trunk elevation and BLE management   Transfers Overall transfer level: Needs assistance               General transfer comment: deferred due to pt declined transfer to chair    Balance Overall balance assessment: Needs assistance Sitting-balance support: Bilateral upper extremity supported Sitting balance-Leahy Scale: Poor Sitting balance - Comments: Pt maxA for static sitting for posterior lean Postural control: Posterior lean                                  ADL either performed or assessed with clinical judgement   ADL Overall ADL's : Needs assistance/impaired Eating/Feeding: Set up;Bed level   Grooming: Minimal assistance;Bed level   Upper Body Bathing: Moderate assistance;Bed level   Lower Body Bathing: Maximal assistance;Total assistance;Cueing for safety;Bed level   Upper Body Dressing : Minimal assistance;Bed level   Lower Body Dressing: Maximal assistance;Total assistance;Cueing for safety   Toilet Transfer: Total assistance   Toileting- Clothing Manipulation and Hygiene: Total assistance       Functional mobility during ADLs: Maximal assistance;Total assistance;+2 for physical assistance;+2 for safety/equipment General ADL Comments: Pt limited by poor ability to care for self and decreased activity tolerance.     Vision Baseline Vision/History: No visual deficits Patient Visual Report: No change from baseline Vision Assessment?: No apparent visual deficits     Perception     Praxis      Pertinent Vitals/Pain Pain Assessment: No/denies pain     Hand Dominance Right   Extremity/Trunk Assessment Upper Extremity Assessment Upper Extremity Assessment: Generalized weakness   Lower Extremity Assessment Lower Extremity Assessment: Defer to PT evaluation LLE Deficits / Details: L BKA   Cervical / Trunk Assessment Cervical / Trunk Assessment: Kyphotic   Communication Communication Communication: No difficulties   Cognition Arousal/Alertness: Awake/alert Behavior During Therapy: WFL for tasks assessed/performed Overall Cognitive Status: Impaired/Different from baseline Area of Impairment: Orientation;Following commands;Memory;Problem solving  Orientation Level: Disoriented to;Situation;Time   Memory: Decreased short-term memory Following Commands: Follows one step commands with increased time;Follows one step commands inconsistently      Problem Solving: Slow processing;Decreased initiation;Difficulty sequencing;Requires verbal cues;Requires tactile cues General Comments: Pt required repetition and frequent multimodal cues.   General Comments  Pt on 6L O2 >90% O2 [place].    Exercises     Shoulder Instructions      Home Living Family/patient expects to be discharged to:: Skilled nursing facility                                        Prior Functioning/Environment Level of Independence: Needs assistance  Gait / Transfers Assistance Needed: Has not walked in 3 years, transfers to w/c independently and propels w/c at baseline. ADL's / Homemaking Assistance Needed: Does his own ADls; has been in bed for ~3 weeks per daughter   Comments: info taken from chart, pt unable to express to therapist        OT Problem List: Decreased strength;Decreased activity tolerance;Impaired balance (sitting and/or standing);Decreased safety awareness;Pain      OT Treatment/Interventions: Self-care/ADL training;Therapeutic exercise;Energy conservation;DME and/or AE instruction;Therapeutic activities;Patient/family education;Balance training;Cognitive remediation/compensation    OT Goals(Current goals can be found in the care plan section) Acute Rehab OT Goals Patient Stated Goal: none stated OT Goal Formulation: Patient unable to participate in goal setting Time For Goal Achievement: 05/23/19 Potential to Achieve Goals: Good ADL Goals Pt Will Perform Grooming: with set-up;sitting Pt Will Perform Upper Body Dressing: with set-up;sitting Pt Will Perform Lower Body Dressing: with min assist;sitting/lateral leans;sit to/from stand;bed level Pt Will Transfer to Toilet: squat pivot transfer;bedside commode Pt/caregiver will Perform Home Exercise Program: Increased strength;Both right and left upper extremity;With Supervision  OT Frequency: Min 2X/week   Barriers to D/C:            Co-evaluation               AM-PAC OT "6 Clicks" Daily Activity     Outcome Measure Help from another person eating meals?: A Lot Help from another person taking care of personal grooming?: A Lot Help from another person toileting, which includes using toliet, bedpan, or urinal?: A Lot Help from another person bathing (including washing, rinsing, drying)?: A Lot Help from another person to put on and taking off regular upper body clothing?: A Little Help from another person to put on and taking off regular lower body clothing?: A Lot 6 Click Score: 13   End of Session Nurse Communication: Mobility status  Activity Tolerance: Patient limited by lethargy Patient left: in bed;with call bell/phone within reach;with bed alarm set  OT Visit Diagnosis: Unsteadiness on feet (R26.81);Muscle weakness (generalized) (M62.81)                Time: 5956-3875 OT Time Calculation (min): 22 min Charges:  OT General Charges $OT Visit: 1 Visit OT Evaluation $OT Eval Moderate Complexity: 1 Mod  Flora Lipps, OTR/L Acute Rehabilitation Services Pager: [phone] Office: [phone]   Aldona Bryner C 05/09/2019, 4:53 PM

## 2019-05-09 NOTE — Progress Notes (Signed)
PHARMACY CONSULT NOTE FOR:  OUTPATIENT  PARENTERAL ANTIBIOTIC THERAPY (OPAT)  Indication: MRSA bacteremia Regimen: Vancomycin 1000 mg every 24 hours  End date: 05/20/19  IV antibiotic discharge orders are pended. To discharging provider:  please sign these orders via discharge navigator,  Select New Orders & click on the button choice - Manage This Unsigned Work.     Thank you for allowing pharmacy to be a part of this patient's care.  Sharin Mons, PharmD, BCPS, BCIDP Infectious Diseases Clinical Pharmacist Phone: [phone] 05/09/2019, 11:24 AM

## 2019-05-09 NOTE — Progress Notes (Signed)
PROGRESS NOTE    Paul Suarez  MEQ:[id number] DOB: 21-Nov-1940 DOA: 05/02/2019 PCP: Pecola Lawless, MD   Brief Narrative:  79 year old gentleman prior history of chronic respiratory failure with hypoxia on 2 L of nasal cannula oxygen at baseline, seizure, essential hypertension, coronary artery disease, recent COVID-19 infection, COPD presents from SNF for evaluation of epistaxis.  Patient was diagnosed with Covid about month ago and was treated with multiple rounds of antibiotics presents to ED for possible hemoptysis.  CT angiogram of the chest done on admission was negative for large central PE but was suspicious for multifocal pneumonia.  He also developed epistaxis and was brought to ED for further evaluation.  Patient has completed 21-day.  From the time he was tested positive and he does not require any isolation at this time.  He was admitted for sepsis secondary to pneumonia and was also found to have Staphylococcus bacteremia.  Hospital course was complicated by worsening respiratory distress with pneumonia PCCM consulted and his CODE STATUS was changed to DNR.  This a.m. suggested that patient is not a good candidate for invasive mechanical ventilation or CPR and family was in agreement with this they recommended bronchodilators antibiotics and suctioning and in the event that he does not improve with these measures patient should be transitioned to comfort care.    Assessment & Plan:   Principal Problem:   Staphylococcus aureus bacteremia Active Problems:   Multifocal pneumonia   CAD (coronary artery disease)   Essential hypertension   COPD with acute exacerbation (HCC)   Major depression, recurrent (HCC)   Seizure disorder (HCC)   Severe sepsis (HCC)   Acute on chronic respiratory failure with hypoxia (HCC)   AKI (acute kidney injury) (HCC)   Acute blood loss anemia   Pressure injury of skin  Sepsis secondary to multifocal pneumonia and secondary bacteremia in 1/4 bottles.  On admission patient presented with hypotension tachycardia leukocytosis, lactic acidosis, AKI with increased oxygen requirements.  Chest x-ray showed bilateral worsening/multifocal pneumonia.  Patient was initially started on Rocephin and Zithromax.  His hospital course was complicated by MRSA bacteremia in 1 out of 4 blood culture bottles.  Infectious disease consulted and recommended IV vancomycin and PICC line placement. Echocardiogram ruled out vegetations. Patient's oxygen requirement is improving currently he is on 6 L of nasal cannula oxygen. Repeat blood cultures have been negative so far. Patient is afebrile and WBC count is improving.   AKI Probably secondary to prerenal azotemia from sepsis and hypotension Patient was adequately resuscitated with fluids and his creatinine is back to baseline.    Epistaxis and hemoptysis Have resolved.    COPD with acute exacerbation No wheezing heard on exam today.  Continue with bronchodilators.  And prednisone taper   E. coli UTI Completed the course of antibiotics.   Essential hypertension Blood pressure parameters are well controlled.    Acute blood loss anemia probably from epistaxis. Hemoglobin has been ranging between 8 and 9.   Seizure disorder Continue with Depakote 500 mg daily.   Pressure injury  Pressure Injury 05/08/19 Coccyx Stage 2 -  Partial thickness loss of dermis presenting as a shallow open injury with a red, pink wound bed without slough. (Active)  05/08/19 1140  Location: Coccyx  Location Orientation:   Staging: Stage 2 -  Partial thickness loss of dermis presenting as a shallow open injury with a red, pink wound bed without slough.  Wound Description (Comments):   Present on Admission: No (found on nightshift,  none noted on admission )     Recent COVID-19 infection Patient was initially diagnosed with Covid on 04/09/2019 and he is out of the 21-day.  But less than 90 days.  Current respiratory  symptoms are secondary to multifocal superimposed pneumonia which may be sequela of the recent Covid infection.    Coronary artery disease Patient denies any chest pain at this time.    Moderate malnutrition Dietary consult recommendations appreciated.   DVT prophylaxis: SCDs Code Status: DNR Family Communication: None at bedside  disposition Plan: Awaiting for PICC line placement and possible SNF placement in the next 24 hours   Consultants:   PCCM  Infectious disease  Procedures: None Antimicrobials: On vancomycin as per infectious disease recommendations for MRSA bacteremia.  Subjective: Patient denies any chest pain appears comfortable on 6 L of nasal cannula oxygen.  Objective: Vitals:   05/09/19 0445 05/09/19 0831 05/09/19 0833 05/09/19 1223  BP:   108/60 126/78  Pulse: (!) 117  (!) 110 92  Resp: 18 18 18 18   Temp:    98.6 F (37 C)  TempSrc:    Oral  SpO2: (!) 87% 94% 97% 100%  Weight:      Height:        Intake/Output Summary (Last 24 hours) at 05/09/2019 1305 Last data filed at 05/09/2019 1052 Gross per 24 hour  Intake 769 ml  Output 806 ml  Net -37 ml   Filed Weights   05/07/19 0017 05/08/19 0152 05/09/19 0422  Weight: 58.9 kg 57.1 kg 57 kg    Examination:  General exam: Appears calm and comfortable on 6 L of nasal cannula oxygen Respiratory system: Clear to auscultation. Respiratory effort normal. Cardiovascular system: S1 & S2 heard, RRR.  No pedal edema. Gastrointestinal system: Abdomen is nondistended, soft and nontender.Normal bowel sounds heard. Central nervous system: Alert and comfortable Extremities: Symmetric 5 x 5 power. Skin: Coccyx area decubitus ulcer Psychiatry: Mood & affect appropriate.     Data Reviewed: I have personally reviewed following labs and imaging studies  CBC: Recent Labs  Lab 05/04/19 0434 05/04/19 0742 05/06/19 0359 05/06/19 0359 05/07/19 0225 05/08/19 0456 05/08/19 1928 05/09/19 0218 05/09/19  1017  WBC 15.7*  --  16.2*  --  17.1* 20.7*  --  15.6*  --   NEUTROABS 13.6*  --  13.9*  --  14.9* 17.3*  --  13.1*  --   HGB 7.6*   < > 8.3*   < > 8.2* 8.4* 8.1* 7.9* 9.2*  HCT 22.9*   < > 26.0*   < > 25.9* 27.0* 25.7* 25.5* 28.9*  MCV 92.0  --  95.6  --  93.8 95.7  --  97.7  --   PLT 220  --  254  --  274 369  --  358  --    < > = values in this interval not displayed.   Basic Metabolic Panel: Recent Labs  Lab 05/04/19 0434 05/06/19 0359 05/07/19 0225 05/08/19 0456 05/09/19 0218  NA 140 139 139 139 139  K 4.1 4.6 4.4 3.7 4.4  CL 107 104 100 99 103  CO2 23 25 27 26 30   GLUCOSE 152* 122* 131* 172* 126*  BUN 34* 26* 27* 30* 31*  CREATININE 0.70 0.66 0.61 0.67 0.46*  CALCIUM 8.5* 8.5* 8.3* 8.6* 8.2*   GFR: Estimated Creatinine Clearance: 61.4 mL/min (A) (by C-G formula based on SCr of 0.46 mg/dL (L)). Liver Function Tests: Recent Labs  Lab 05/02/19 1647 05/03/19  [id number] 05/09/19 0218  AST 18 16 17   ALT 12 14 10   ALKPHOS 44 42 42  BILITOT 0.7 0.5 0.5  PROT 5.5* 5.6* 5.1*  ALBUMIN 1.9* 2.1* 2.1*   No results for input(s): LIPASE, AMYLASE in the last 168 hours. No results for input(s): AMMONIA in the last 168 hours. Coagulation Profile: Recent Labs  Lab 05/02/19 1647 05/08/19 1928  INR 1.3* 1.1   Cardiac Enzymes: No results for input(s): CKTOTAL, CKMB, CKMBINDEX, TROPONINI in the last 168 hours. BNP (last 3 results) No results for input(s): PROBNP in the last 8760 hours. HbA1C: No results for input(s): HGBA1C in the last 72 hours. CBG: No results for input(s): GLUCAP in the last 168 hours. Lipid Profile: No results for input(s): CHOL, HDL, LDLCALC, TRIG, CHOLHDL, LDLDIRECT in the last 72 hours. Thyroid Function Tests: No results for input(s): TSH, T4TOTAL, FREET4, T3FREE, THYROIDAB in the last 72 hours. Anemia Panel: No results for input(s): VITAMINB12, FOLATE, FERRITIN, TIBC, IRON, RETICCTPCT in the last 72 hours. Sepsis Labs: Recent Labs  Lab 05/02/19  1647 05/02/19 1911 05/02/19 2350 05/03/19 0218  LATICACIDVEN 3.7* 2.3* 1.8 1.8    Recent Results (from the past 240 hour(s))  Blood Culture (routine x 2)     Status: Abnormal   Collection Time: 05/02/19  5:06 PM   Specimen: BLOOD  Result Value Ref Range Status   Specimen Description BLOOD BLOOD RIGHT FOREARM  Final   Special Requests   Final    BOTTLES DRAWN AEROBIC AND ANAEROBIC Blood Culture adequate volume   Culture  Setup Time   Final    ANAEROBIC BOTTLE ONLY GRAM POSITIVE COCCI IN CLUSTERS CRITICAL RESULT CALLED TO, READ BACK BY AND VERIFIED WITH: L SEAY PHARMD 05/05/19 [id number] JDW Performed at [facility], [address] [place], [place] [address]    Culture METHICILLIN RESISTANT STAPHYLOCOCCUS AUREUS (A)  Final   Report Status 05/08/2019 FINAL  Final   Organism ID, Bacteria METHICILLIN RESISTANT STAPHYLOCOCCUS AUREUS  Final      Susceptibility   Methicillin resistant staphylococcus aureus - MIC*    CIPROFLOXACIN >=8 RESISTANT Resistant     ERYTHROMYCIN >=8 RESISTANT Resistant     GENTAMICIN <=0.5 SENSITIVE Sensitive     OXACILLIN >=4 RESISTANT Resistant     TETRACYCLINE <=1 SENSITIVE Sensitive     VANCOMYCIN 1 SENSITIVE Sensitive     TRIMETH/SULFA <=10 SENSITIVE Sensitive     CLINDAMYCIN <=0.25 SENSITIVE Sensitive     RIFAMPIN <=0.5 SENSITIVE Sensitive     Inducible Clindamycin NEGATIVE Sensitive     * METHICILLIN RESISTANT STAPHYLOCOCCUS AUREUS  Blood Culture (routine x 2)     Status: None   Collection Time: 05/02/19  5:08 PM   Specimen: BLOOD  Result Value Ref Range Status   Specimen Description BLOOD BLOOD LEFT FOREARM  Final   Special Requests   Final    BOTTLES DRAWN AEROBIC AND ANAEROBIC Blood Culture adequate volume   Culture   Final    NO GROWTH 5 DAYS Performed at [facility], [address] [place], [place] [address]    Report Status 05/07/2019 FINAL  Final  Urine culture     Status: Abnormal   Collection Time: 05/02/19  5:08 PM    Specimen: In/Out Cath Urine  Result Value Ref Range Status   Specimen Description IN/OUT CATH URINE  Final   Special Requests   Final    NONE Performed at [facility], [address] [place],  [place] [address]    Culture (A)  Final    5,000 COLONIES/mL ESCHERICHIA COLI Confirmed Extended Spectrum Beta-Lactamase Producer (ESBL).  In bloodstream infections from ESBL organisms, carbapenems are preferred over piperacillin/tazobactam. They are shown to have a lower risk of mortality.    Report Status 05/04/2019 FINAL  Final   Organism ID, Bacteria ESCHERICHIA COLI (A)  Final      Susceptibility   Escherichia coli - MIC*    AMPICILLIN >=32 RESISTANT Resistant     CEFAZOLIN >=64 RESISTANT Resistant     CEFTRIAXONE >=64 RESISTANT Resistant     CIPROFLOXACIN >=4 RESISTANT Resistant     GENTAMICIN <=1 SENSITIVE Sensitive     IMIPENEM <=0.25 SENSITIVE Sensitive     NITROFURANTOIN <=16 SENSITIVE Sensitive     TRIMETH/SULFA <=20 SENSITIVE Sensitive     AMPICILLIN/SULBACTAM 16 INTERMEDIATE Intermediate     PIP/TAZO <=4 SENSITIVE Sensitive     * 5,000 COLONIES/mL ESCHERICHIA COLI  SARS CORONAVIRUS 2 (TAT 6-24 HRS) Nasopharyngeal Nasopharyngeal Swab     Status: Abnormal   Collection Time: 05/02/19  6:26 PM   Specimen: Nasopharyngeal Swab  Result Value Ref Range Status   SARS Coronavirus 2 POSITIVE (A) NEGATIVE Final    Comment: RESULT CALLED TO, READ BACK BY AND VERIFIED WITH: H.MASHBURN RN 2253 05/02/19 MCCORMICK K (NOTE) SARS-CoV-2 target nucleic acids are DETECTED. The SARS-CoV-2 RNA is generally detectable in upper and lower respiratory specimens during the acute phase of infection. Positive results are indicative of the presence of SARS-CoV-2 RNA. Clinical correlation with patient history and other diagnostic information is  necessary to determine patient infection status. Positive results do not rule out bacterial infection or co-infection with other viruses.  The expected  result is Negative. Fact Sheet for Patients: HairSlick.no Fact Sheet for Healthcare Providers: quierodirigir.com This test is not yet approved or cleared by the [place] FDA and  has been authorized for detection and/or diagnosis of SARS-CoV-2 by FDA under an Emergency Use Authorization (EUA). This EUA will remain  in effect (meaning this test can be used) for  the duration of the COVID-19 declaration under Section 564(b)(1) of the Act, 21 U.S.C. section 360bbb-3(b)(1), unless the authorization is terminated or revoked sooner. Performed at [facility], [address], [place], [place] [address]   MRSA PCR Screening     Status: None   Collection Time: 05/02/19  8:30 PM   Specimen: Nasopharyngeal  Result Value Ref Range Status   MRSA by PCR NEGATIVE NEGATIVE Final    Comment:        The GeneXpert MRSA Assay (FDA approved for NASAL specimens only), is one component of a comprehensive MRSA colonization surveillance program. It is not intended to diagnose MRSA infection nor to guide or monitor treatment for MRSA infections. Performed at [facility], [address], [place], [place] [address]   MRSA PCR Screening     Status: None   Collection Time: 05/05/19 11:06 AM   Specimen: Nasopharyngeal  Result Value Ref Range Status   MRSA by PCR NEGATIVE NEGATIVE Final    Comment:        The GeneXpert MRSA Assay (FDA approved for NASAL specimens only), is one component of a comprehensive MRSA colonization surveillance program. It is not intended to diagnose MRSA infection nor to guide or monitor treatment for MRSA infections. Performed at [facility], [address], [place], [place] [address]   Culture, blood (Routine X 2) w Reflex to ID Panel  Status: None (Preliminary result)   Collection Time: 05/06/19 11:59 AM   Specimen: BLOOD  Result Value Ref Range Status   Specimen Description BLOOD RIGHT  ANTECUBITAL  Final   Special Requests   Final    BOTTLES DRAWN AEROBIC ONLY Blood Culture adequate volume   Culture   Final    NO GROWTH 2 DAYS Performed at [facility], [address], [place], [place] [address]    Report Status PENDING  Incomplete  Culture, blood (Routine X 2) w Reflex to ID Panel     Status: None (Preliminary result)   Collection Time: 05/06/19 11:59 AM   Specimen: BLOOD LEFT FOREARM  Result Value Ref Range Status   Specimen Description BLOOD LEFT FOREARM  Final   Special Requests   Final    BOTTLES DRAWN AEROBIC ONLY Blood Culture adequate volume   Culture   Final    NO GROWTH 2 DAYS Performed at [facility], [address], [place], [place] [address]    Report Status PENDING  Incomplete         Radiology Studies: DG CHEST PORT 1 VIEW  Result Date: 05/09/2019 CLINICAL DATA:  Sepsis, cough. EXAM: PORTABLE CHEST 1 VIEW COMPARISON:  May 06, 2019. FINDINGS: The heart size and mediastinal contours are within normal limits. No pneumothorax or pleural effusion is noted. Stable biapical scarring and pleural thickening is noted. Stable bilateral lung opacities are noted in the right lung base with left basilar region concerning for pneumonia. The visualized skeletal structures are unremarkable. IMPRESSION: Stable bilateral lung opacities are noted concerning for multifocal pneumonia. Electronically Signed   By: Marijo Conception M.D.   On: 05/09/2019 12:55   DG Swallowing Func-Speech Pathology  Result Date: 05/08/2019 Objective Swallowing Evaluation: Type of Study: MBS-Modified Barium Swallow Study  Patient Details Name: Little Winton MRN: [id number] Date of Birth: 08/24/1940 Today's Date: 05/08/2019 Time: SLP Start Time (ACUTE ONLY): 0845 -SLP Stop Time (ACUTE ONLY): 0905 SLP Time Calculation (min) (ACUTE ONLY): 20 min Past Medical History: Past Medical History: Diagnosis Date . CAD (coronary artery disease)  . COPD (chronic obstructive pulmonary disease)  ([place])  . Depression  . Essential hypertension  . Seizure disorder ([place])  . Staphylococcus aureus bacteremia 05/06/2019 Past Surgical History: Past Surgical History: Procedure Laterality Date . BELOW KNEE LEG AMPUTATION Left  HPI: Kiyan Burmester is a 79 y.o. gentleman with history of CAD and PVD s/p BKA, Severe centrilobular emphysema on home oxygen 2LNC, who lives in a SNF.He was previously diagnosed with COVID 19 pneumonia. Has a history of recurrent resistant ESBL producing organisms. Recent outpatient anitbiotics courses for concern for post-covid bacterial pneumonia. Having epistaxis which was refractory and prompted ED visit. He was started on CAP coverage with ceftriaxone/azithromycin and steroids for COPD exacerbation. Progressively worsening hypoxemia over the last 3 days PTA. Possible acute aspiration event (secretions?) and now on more oxygen. Also found to have blood cultures positive for Staph aureus - sensitivities pending.  No data recorded Assessment / Plan / Recommendation CHL IP CLINICAL IMPRESSIONS 05/08/2019 Clinical Impression Pt demonstrates normal swallow function for age. Strength WNL, no significant residue. There are instances of slightly slower laryngeal elevation in the swallow, but again WNL for age and did not result in any penetration or aspiraiton. Pt able to masticate soft solids with extra time despite missing dentition. Recommend pt continue regular/thin unrestricted diet with family assist to choose appropriate foods, discussed with dtr. No SLP f/u needed, will sign off.  SLP Visit Diagnosis Dysphagia, unspecified (R13.10) Attention and concentration deficit following -- Frontal lobe and executive function deficit following -- Impact on safety and function Mild aspiration risk   CHL IP TREATMENT RECOMMENDATION 05/08/2019 Treatment Recommendations No treatment recommended at this time   No flowsheet data found. CHL IP DIET RECOMMENDATION 05/08/2019 SLP Diet Recommendations Regular  solids;Thin liquid Liquid Administration via Cup;Straw Medication Administration Whole meds with liquid Compensations Slow rate;Small sips/bites Postural Changes --   No flowsheet data found.  CHL IP FOLLOW UP RECOMMENDATIONS 05/08/2019 Follow up Recommendations None   No flowsheet data found.     CHL IP ORAL PHASE 05/08/2019 Oral Phase WFL Oral - Pudding Teaspoon -- Oral - Pudding Cup -- Oral - Honey Teaspoon -- Oral - Honey Cup -- Oral - Nectar Teaspoon -- Oral - Nectar Cup -- Oral - Nectar Straw -- Oral - Thin Teaspoon -- Oral - Thin Cup -- Oral - Thin Straw -- Oral - Puree -- Oral - Mech Soft -- Oral - Regular -- Oral - Multi-Consistency -- Oral - Pill -- Oral Phase - Comment --  CHL IP PHARYNGEAL PHASE 05/08/2019 Pharyngeal Phase WFL Pharyngeal- Pudding Teaspoon -- Pharyngeal -- Pharyngeal- Pudding Cup -- Pharyngeal -- Pharyngeal- Honey Teaspoon -- Pharyngeal -- Pharyngeal- Honey Cup -- Pharyngeal -- Pharyngeal- Nectar Teaspoon -- Pharyngeal -- Pharyngeal- Nectar Cup -- Pharyngeal -- Pharyngeal- Nectar Straw -- Pharyngeal -- Pharyngeal- Thin Teaspoon -- Pharyngeal -- Pharyngeal- Thin Cup -- Pharyngeal -- Pharyngeal- Thin Straw -- Pharyngeal -- Pharyngeal- Puree -- Pharyngeal -- Pharyngeal- Mechanical Soft -- Pharyngeal -- Pharyngeal- Regular -- Pharyngeal -- Pharyngeal- Multi-consistency -- Pharyngeal -- Pharyngeal- Pill -- Pharyngeal -- Pharyngeal Comment --  No flowsheet data found. Harlon Ditty, MA CCC-SLP Acute Rehabilitation Services Pager [phone] Office [phone] Claudine Mouton 05/08/2019, 10:04 AM              [place] EKG SITE RITE  Result Date: 05/09/2019 If [facility] image not attached, placement could not be confirmed due to current cardiac rhythm.       Scheduled Meds: . divalproex  500 mg Oral QHS  . feeding supplement (ENSURE ENLIVE)  237 mL Oral TID BM  . guaiFENesin  1,200 mg Oral BID  . multivitamin with minerals  1 tablet Oral Daily  . predniSONE  40 mg Oral Q  breakfast  . sertraline  25 mg Oral Daily  . sodium chloride flush  3 mL Intravenous Q12H  . sodium chloride flush  3 mL Intravenous Q12H  . umeclidinium bromide  1 puff Inhalation Daily   Continuous Infusions: . sodium chloride 250 mL (05/09/19 1051)  . vancomycin 1,000 mg (05/09/19 1052)     LOS: 7 days        Kathlen Mody, MD [facility]   To contact the attending provider between 7A-7P or the covering provider during after hours 7P-7A, please log into the web site [email or web address] and access using universal  password for that web site. If you do not have the password, please call the hospital operator.  05/09/2019, 1:05 PM

## 2019-05-09 NOTE — Progress Notes (Signed)
[facility] for Infectious Disease  Date of Admission:  05/02/2019      Total days of antibiotics 7    Day 5 Vancomycin           ASSESSMENT: Paul Suarez is a 79 y.o. male with a history of COVID pneumonia in late January 2021 now with worsening respiratory status/hypoxia, multifocal infiltrates on CXR and secondary MRSA bacteremia in 1/4 bottles. Transthoracic echo negative for vegetations. We did not pursue TEE with respiratory status being tenuous.  WIll place PICC today and plan for 14 days total from cleared blood culture for bacteremia treatment. Will have him in office to follow up at the end of treatment to evaluate respiratory status, repeat CXR and consider extension of therapy with something by mouth for MRSA pneumonia.  Check CXR today.  Cleared by speech with intact swallow function.     PLAN: 1. Continue vancomycin  2. Place PICC 3. OPAT as detailed below.  4. Patient previously from [place] - would anticipate return.   OPAT ORDERS:  Diagnosis: Pneumonia, Bacteremia   Culture Result: MRSA (S-tetracycline, bactrim, clinda)   No Known Allergies   Discharge antibiotics:  Per pharmacy protocol Vancomycin  Aim for Vancomycin trough 15-20 or AUC 400-550   Duration: 14 days   End Date: March 7th   [place] Per Protocol with [employer] Use: Home health RN for IV administration and teaching, line care and labs.    Labs weekly while on IV antibiotics: _x_ CBC with differential _x_ BMP TWICE A WEEK PLEASE  __ CMP __ CRP __ ESR _x_ Vancomycin trough __ CK  _x_ Please pull PIC at completion of IV antibiotics __ Please leave PIC in place until doctor has seen patient or been notified  Fax weekly labs to [phone]  Clinic Follow Up Appt: March 8th @ 3:15 pm with Janene Madeira, NP @ [facility] for Infectious Disease      Principal Problem:   Staphylococcus aureus bacteremia Active Problems:   Multifocal pneumonia   CAD  (coronary artery disease)   Essential hypertension   COPD with acute exacerbation ([place])   Major depression, recurrent ([place])   Seizure disorder ([place])   Severe sepsis ([place])   Acute on chronic respiratory failure with hypoxia ([place])   AKI (acute kidney injury) ([place])   Acute blood loss anemia   Pressure injury of skin    . divalproex  500 mg Oral QHS  . feeding supplement (ENSURE ENLIVE)  237 mL Oral TID BM  . guaiFENesin  1,200 mg Oral BID  . multivitamin with minerals  1 tablet Oral Daily  . predniSONE  40 mg Oral Q breakfast  . sertraline  25 mg Oral Daily  . sodium chloride flush  3 mL Intravenous Q12H  . sodium chloride flush  3 mL Intravenous Q12H  . umeclidinium bromide  1 puff Inhalation Daily    SUBJECTIVE: He is happy to see [place] today. 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  Was doing "exercises" in bed last night and tachycardic with decreased POx requiring bump up to 7 LPM. Now down to 5 LPM. Still with cough that is productive. Trying to use flutter valve.   Has not been out of bed. Prior to hospitalization was participating in daily physical therapy "on his feet."     Review of Systems: Review of Systems  Constitutional: Negative  for chills, fever and malaise/fatigue.  HENT: Negative for sore throat.   Respiratory: Positive for cough and shortness of breath. Negative for sputum production.   Cardiovascular: Negative for chest pain and leg swelling.  Gastrointestinal: Negative for abdominal pain, diarrhea and vomiting.  Genitourinary: Negative for dysuria.  Musculoskeletal: Negative for back pain, joint pain and myalgias.  Skin: Negative for rash.  Neurological: Negative for weakness.    No Known Allergies  OBJECTIVE: Vitals:   05/09/19 0422 05/09/19 0445 05/09/19 0831 05/09/19 0833  BP: 121/77   108/60  Pulse: (!) 130 (!) 117  (!) 110  Resp: (!) _0 Temp: 97.6 F (36.4 C)     TempSrc: Oral     SpO2:  (!) 85% (!) 87% 94% 97%  Weight: 57 kg     Height:       Body mass index is 17.53 kg/m.  Physical Exam Constitutional:      Appearance: Normal appearance.     Comments: Resting comfortably in bed watching TV   HENT:     Mouth/Throat:     Mouth: Mucous membranes are dry.  Eyes:     General: No scleral icterus.    Pupils: Pupils are equal, round, and reactive to light.  Cardiovascular:     Rate and Rhythm: Normal rate and regular rhythm.     Pulses: Normal pulses.  Pulmonary:     Effort: Pulmonary effort is normal.     Breath sounds: Examination of the right-upper field reveals rhonchi. Examination of the left-upper field reveals decreased breath sounds and rhonchi. Decreased breath sounds and rhonchi present. No wheezing.  Abdominal:     General: There is no distension.     Tenderness: There is no abdominal tenderness.  Musculoskeletal:        General: Normal range of motion.  Skin:    General: Skin is warm and dry.  Neurological:     Mental Status: He is alert.     Comments: Pleasant   Psychiatric:        Mood and Affect: Mood normal.     Lab Results Lab Results  Component Value Date   WBC 15.6 (H) 05/09/2019   HGB 9.2 (L) 05/09/2019   HCT 28.9 (L) 05/09/2019   MCV 97.7 05/09/2019   PLT 358 05/09/2019    Lab Results  Component Value Date   CREATININE 0.46 (L) 05/09/2019   BUN 31 (H) 05/09/2019   NA 139 05/09/2019   K 4.4 05/09/2019   CL 103 05/09/2019   CO2 30 05/09/2019    Lab Results  Component Value Date   ALT 10 05/09/2019   AST 17 05/09/2019   ALKPHOS 42 05/09/2019   BILITOT 0.5 05/09/2019     Microbiology: Recent Results (from the past 240 hour(s))  Blood Culture (routine x 2)     Status: Abnormal   Collection Time: 05/02/19  5:06 PM   Specimen: BLOOD  Result Value Ref Range Status   Specimen Description BLOOD BLOOD RIGHT FOREARM  Final   Special Requests   Final    BOTTLES DRAWN AEROBIC AND ANAEROBIC Blood Culture adequate volume    Culture  Setup Time   Final    ANAEROBIC BOTTLE ONLY GRAM POSITIVE COCCI IN CLUSTERS CRITICAL RESULT CALLED TO, READ BACK BY AND VERIFIED WITH: L SEAY PHARMD 05/05/19 7867 JDW Performed at [facility], [place] [address], [place],  [address]    Culture METHICILLIN RESISTANT STAPHYLOCOCCUS AUREUS (A)  Final   Report Status 05/08/2019 FINAL  Final   Organism ID, Bacteria METHICILLIN RESISTANT STAPHYLOCOCCUS AUREUS  Final      Susceptibility   Methicillin resistant staphylococcus aureus - MIC*    CIPROFLOXACIN >=8 RESISTANT Resistant     ERYTHROMYCIN >=8 RESISTANT Resistant     GENTAMICIN <=0.5 SENSITIVE Sensitive     OXACILLIN >=4 RESISTANT Resistant     TETRACYCLINE <=1 SENSITIVE Sensitive     VANCOMYCIN 1 SENSITIVE Sensitive     TRIMETH/SULFA <=10 SENSITIVE Sensitive     CLINDAMYCIN <=0.25 SENSITIVE Sensitive     RIFAMPIN <=0.5 SENSITIVE Sensitive     Inducible Clindamycin NEGATIVE Sensitive     * METHICILLIN RESISTANT STAPHYLOCOCCUS AUREUS  Blood Culture (routine x 2)     Status: None   Collection Time: 05/02/19  5:08 PM   Specimen: BLOOD  Result Value Ref Range Status   Specimen Description BLOOD BLOOD LEFT FOREARM  Final   Special Requests   Final    BOTTLES DRAWN AEROBIC AND ANAEROBIC Blood Culture adequate volume   Culture   Final    NO GROWTH 5 DAYS Performed at [facility], [address], [place], [place] [address]    Report Status 05/07/2019 FINAL  Final  Urine culture     Status: Abnormal   Collection Time: 05/02/19  5:08 PM   Specimen: In/Out Cath Urine  Result Value Ref Range Status   Specimen Description IN/OUT CATH URINE  Final   Special Requests   Final    NONE Performed at [facility], [place] [address] [place], [place] [address]    Culture (A)  Final    5,000 COLONIES/mL ESCHERICHIA COLI Confirmed Extended Spectrum Beta-Lactamase Producer (ESBL).  In bloodstream infections from ESBL organisms, carbapenems are preferred over  piperacillin/tazobactam. They are shown to have a lower risk of mortality.    Report Status 05/04/2019 FINAL  Final   Organism ID, Bacteria ESCHERICHIA COLI (A)  Final      Susceptibility   Escherichia coli - MIC*    AMPICILLIN >=32 RESISTANT Resistant     CEFAZOLIN >=64 RESISTANT Resistant     CEFTRIAXONE >=64 RESISTANT Resistant     CIPROFLOXACIN >=4 RESISTANT Resistant     GENTAMICIN <=1 SENSITIVE Sensitive     IMIPENEM <=0.25 SENSITIVE Sensitive     NITROFURANTOIN <=16 SENSITIVE Sensitive     TRIMETH/SULFA <=20 SENSITIVE Sensitive     AMPICILLIN/SULBACTAM 16 INTERMEDIATE Intermediate     PIP/TAZO <=4 SENSITIVE Sensitive     * 5,000 COLONIES/mL ESCHERICHIA COLI  SARS CORONAVIRUS 2 (TAT 6-24 HRS) Nasopharyngeal Nasopharyngeal Swab     Status: Abnormal   Collection Time: 05/02/19  6:26 PM   Specimen: Nasopharyngeal Swab  Result Value Ref Range Status   SARS Coronavirus 2 POSITIVE (A) NEGATIVE Final    Comment: RESULT CALLED TO, READ BACK BY AND VERIFIED WITH: H.MASHBURN RN 2253 05/02/19 MCCORMICK K (NOTE) SARS-CoV-2 target nucleic acids are DETECTED. The SARS-CoV-2 RNA is generally detectable in upper and lower respiratory specimens during the acute phase of infection. Positive results are indicative of the presence of SARS-CoV-2 RNA. Clinical correlation with patient history and other diagnostic information is  necessary to determine patient infection status. Positive results do not rule out bacterial infection or co-infection with other viruses.  The expected result is Negative. Fact Sheet for Patients: SugarRoll.be Fact Sheet for Healthcare Providers: https://www.woods-mathews.com/ This test is not yet approved or cleared by the [place] FDA and  has been authorized for detection and/or diagnosis of SARS-CoV-2 by FDA under an Emergency Use Authorization (EUA). This EUA will remain  in effect (meaning this test can be used) for   the duration of the COVID-19 declaration under Section 564(b)(1) of the Act, 21 U.S.C. section 360bbb-3(b)(1), unless the authorization is terminated or revoked sooner. Performed at [facility], [place] [address] [place], [place] [address]   MRSA PCR Screening     Status: None   Collection Time: 05/02/19  8:30 PM   Specimen: Nasopharyngeal  Result Value Ref Range Status   MRSA by PCR NEGATIVE NEGATIVE Final    Comment:        The GeneXpert MRSA Assay (FDA approved for NASAL specimens only), is one component of a comprehensive MRSA colonization surveillance program. It is not intended to diagnose MRSA infection nor to guide or monitor treatment for MRSA infections. Performed at [facility], [place] [address] [place], [place] [address]   MRSA PCR Screening     Status: None   Collection Time: 05/05/19 11:06 AM   Specimen: Nasopharyngeal  Result Value Ref Range Status   MRSA by PCR NEGATIVE NEGATIVE Final    Comment:        The GeneXpert MRSA Assay (FDA approved for NASAL specimens only), is one component of a comprehensive MRSA colonization surveillance program. It is not intended to diagnose MRSA infection nor to guide or monitor treatment for MRSA infections. Performed at [facility], [place] [address], [place], [place] [address]   Culture, blood (Routine X 2) w Reflex to ID Panel     Status: None (Preliminary result)   Collection Time: 05/06/19 11:59 AM   Specimen: BLOOD  Result Value Ref Range Status   Specimen Description BLOOD RIGHT ANTECUBITAL  Final   Special Requests   Final    BOTTLES DRAWN AEROBIC ONLY Blood Culture adequate volume   Culture   Final    NO GROWTH 2 DAYS Performed at [facility], [place] [address], [place], [place] [address]    Report Status PENDING  Incomplete  Culture, blood (Routine X 2) w Reflex to ID Panel     Status: None (Preliminary result)   Collection Time: 05/06/19 11:59 AM   Specimen: BLOOD LEFT FOREARM    Result Value Ref Range Status   Specimen Description BLOOD LEFT FOREARM  Final   Special Requests   Final    BOTTLES DRAWN AEROBIC ONLY Blood Culture adequate volume   Culture   Final    NO GROWTH 2 DAYS Performed at [facility], [place] [address] [place], [place] [address]    Report Status PENDING  Incomplete     Janene Madeira, MSN, NP-C [facility] for Infectious Disease [email or web address] Pager: [phone] Office: [phone] [place]: [phone]

## 2019-05-09 NOTE — Consult Note (Signed)
WOC Nurse Consult Note: Patient receiving care in [facility] [id number].  Consult completed remotely after review of record.  At the current time, the FYIS box contains the following statement:  "DETECTED/POSITIVE/PRESUMPTIVE POSITIVE NOVEL CORONAVIRUS (2019-NCOV)". Reason for Consult: Stage 2 sacrum Wound type: as above Pressure Injury POA: Yes/No/NA Measurement: To be provided by the bedside RN in the flowsheet section Wound bed: Drainage (amount, consistency, odor)  Periwound: Dressing procedure/placement/frequency:  I have initiated the Skin Care Order Set that is available for any licensed nurse to use for Stage 2 PIs.   Monitor the wound area(s) for worsening of condition such as: Signs/symptoms of infection,  Increase in size,  Development of or worsening of odor, Development of pain, or increased pain at the affected locations.  Notify the medical team if any of these develop.  Thank you for the consult. WOC nurse will not follow at this time.  Please re-consult the WOC team if needed.  Helmut Muster, RN, MSN, CWOCN, CNS-BC, pager [phone]

## 2019-05-10 DIAGNOSIS — E43 Unspecified severe protein-calorie malnutrition: Secondary | ICD-10-CM | POA: Insufficient documentation

## 2019-05-10 LAB — HEMOGLOBIN AND HEMATOCRIT, BLOOD
HCT: 26.1 % — ABNORMAL LOW (ref 39.0–52.0)
HCT: 27.2 % — ABNORMAL LOW (ref 39.0–52.0)
Hemoglobin: 8.2 g/dL — ABNORMAL LOW (ref 13.0–17.0)
Hemoglobin: 8.5 g/dL — ABNORMAL LOW (ref 13.0–17.0)

## 2019-05-10 MED ORDER — HEPARIN SODIUM (PORCINE) 5000 UNIT/ML IJ SOLN
5000.0000 [IU] | Freq: Three times a day (TID) | INTRAMUSCULAR | Status: DC
  Administered 2019-05-10 – 2019-05-11 (×4): 5000 [IU] via SUBCUTANEOUS
  Filled 2019-05-10 (×3): qty 1

## 2019-05-10 NOTE — Progress Notes (Signed)
Pt. With 15 beats of SVT (145). Pt. Alert and stable. No distress noted. Pt. Resting in bed. On call for [facility] paged to make aware. RN will continue to monitor. Shakeel Disney, Cheryll Dessert

## 2019-05-10 NOTE — TOC Progression Note (Signed)
Transition of Care [facility]) - Progression Note    Patient Details  Name: Paul Suarez MRN: [id number] Date of Birth: 01/08/1941  Transition of [facility] CM/SW Contact  Gildardo Griffes, [place] Phone Number: 05/10/2019, 12:04 PM  Clinical Narrative:     CSW received call from [facility], family has chosen their facility as they had received a call from patient's daughter Chrystal.   CSW has requested updated covid test be ordered by MD for timely discharge to [facility] when ready.   Expected Discharge Plan: Skilled Nursing Facility Barriers to Discharge: Continued Medical Work up  Expected Discharge Plan and Services Expected Discharge Plan: Skilled Nursing Facility     Post Acute Care Choice: Skilled Nursing Facility Living arrangements for the past 2 months: Skilled Nursing Facility([facility])                                       Social Determinants of Health (SDOH) Interventions    Readmission Risk Interventions No flowsheet data found.

## 2019-05-10 NOTE — Progress Notes (Signed)
[facility] for Infectious Disease  Date of Admission:  05/02/2019      Total days of antibiotics 9   Day 6 Vancomycin           ASSESSMENT: Paul Suarez is a 79 y.o. male with a history of COVID pneumonia in late January 2021 now with worsening respiratory status/hypoxia, multifocal infiltrates on CXR and secondary MRSA bacteremia in 1/4 bottles. Transthoracic echo negative for vegetations. We did not pursue TEE with respiratory status being tenuous.  Due for PICC to complete 14 days total from cleared blood culture for bacteremia treatment. Will have him in office to follow up at the end of treatment to evaluate respiratory status, repeat CXR and consider extension of therapy with PO option for MRSA pneumonia --> will determine outpatient at follow up.  CXR stable.    PLAN: No changes to previous notes.  Slated to get PICC today CXR stable   OPAT ORDERS:  Diagnosis: Pneumonia, Bacteremia   Culture Result: MRSA (S-tetracycline, bactrim, clinda)   No Known Allergies   Discharge antibiotics:  Per pharmacy protocol Vancomycin  Aim for Vancomycin trough 15-20 or AUC 400-550   Duration: 14 days   End Date: March 7th   [place] Per Protocol with [employer] Use: Home health RN for IV administration and teaching, line care and labs.    Labs weekly while on IV antibiotics: _x_ CBC with differential _x_ BMP TWICE A WEEK PLEASE  __ CMP __ CRP __ ESR _x_ Vancomycin trough __ CK  _x_ Please pull PIC at completion of IV antibiotics __ Please leave PIC in place until doctor has seen patient or been notified  Fax weekly labs to [phone]  Clinic Follow Up Appt: March 8th @ 3:15 pm with Janene Madeira, NP @ [facility] for Infectious Disease      Principal Problem:   Staphylococcus aureus bacteremia Active Problems:   Multifocal pneumonia   CAD (coronary artery disease)   Essential hypertension   COPD with acute exacerbation ([place])  Major depression, recurrent ([place])   Seizure disorder ([place])   Severe sepsis ([place])   Acute on chronic respiratory failure with hypoxia ([place])   AKI (acute kidney injury) ([place])   Acute blood loss anemia   Pressure injury of skin   Protein-calorie malnutrition, severe    . divalproex  500 mg Oral QHS  . feeding supplement (ENSURE ENLIVE)  237 mL Oral TID BM  . guaiFENesin  1,200 mg Oral BID  . heparin injection (subcutaneous)  5,000 Units Subcutaneous Q8H  . multivitamin with minerals  1 tablet Oral Daily  . predniSONE  40 mg Oral Q breakfast  . sertraline  25 mg Oral Daily  . sodium chloride flush  3 mL Intravenous Q12H  . sodium chloride flush  3 mL Intravenous Q12H  . umeclidinium bromide  1 puff Inhalation Daily    SUBJECTIVE: Doing better. Cough seems less productive. Sat on the side of the bed from what he said. Down to 4 LPM.   HR low 100s with effort    Review of Systems: Review of Systems  Constitutional: Negative for chills, fever and malaise/fatigue.  HENT: Negative for sore throat.   Respiratory: Positive for cough and shortness of breath. Negative for sputum production.   Cardiovascular: Negative for chest pain and leg swelling.  Gastrointestinal: Negative for abdominal pain, diarrhea and vomiting.  Genitourinary: Negative for dysuria.  Musculoskeletal: Negative for back pain, joint pain and  myalgias.  Skin: Negative for rash.  Neurological: Negative for weakness.    No Known Allergies  OBJECTIVE: Vitals:   05/10/19 0100 05/10/19 0300 05/10/19 0500 05/10/19 0646  BP:  121/66 121/65   Pulse:  98 96   Resp: (!) 26 (!) 33 (!) 26   Temp:   97.9 F (36.6 C)   TempSrc:      SpO2:  95% 94%   Weight:    57.2 kg  Height:       Body mass index is 17.59 kg/m.   Physical Exam Constitutional:      Appearance: Normal appearance.     Comments: Resting comfortably in bed watching TV  HENT:     Mouth/Throat:     Mouth: Mucous membranes are dry.  Eyes:      General: No scleral icterus.    Pupils: Pupils are equal, round, and reactive to light.  Cardiovascular:     Rate and Rhythm: Normal rate and regular rhythm.     Pulses: Normal pulses.  Pulmonary:     Effort: Pulmonary effort is normal.     Breath sounds: Examination of the right-upper field reveals rhonchi. Examination of the left-upper field reveals decreased breath sounds and rhonchi. Decreased breath sounds and rhonchi present. No wheezing.     Comments: Breathing comfortably today. No cough demonstrated during interview/exam Abdominal:     General: Bowel sounds are normal. There is no distension.     Tenderness: There is no abdominal tenderness.  Musculoskeletal:        General: Normal range of motion.  Skin:    General: Skin is warm and dry.  Neurological:     Mental Status: He is alert.     Comments: Pleasant   Psychiatric:        Mood and Affect: Mood normal.     Lab Results Lab Results  Component Value Date   WBC 15.6 (H) 05/09/2019   HGB 8.5 (L) 05/10/2019   HCT 27.2 (L) 05/10/2019   MCV 97.7 05/09/2019   PLT 358 05/09/2019    Lab Results  Component Value Date   CREATININE 0.46 (L) 05/09/2019   BUN 31 (H) 05/09/2019   NA 139 05/09/2019   K 4.4 05/09/2019   CL 103 05/09/2019   CO2 30 05/09/2019    Lab Results  Component Value Date   ALT 10 05/09/2019   AST 17 05/09/2019   ALKPHOS 42 05/09/2019   BILITOT 0.5 05/09/2019     Microbiology: Recent Results (from the past 240 hour(s))  Blood Culture (routine x 2)     Status: Abnormal   Collection Time: 05/02/19  5:06 PM   Specimen: BLOOD  Result Value Ref Range Status   Specimen Description BLOOD BLOOD RIGHT FOREARM  Final   Special Requests   Final    BOTTLES DRAWN AEROBIC AND ANAEROBIC Blood Culture adequate volume   Culture  Setup Time   Final    ANAEROBIC BOTTLE ONLY GRAM POSITIVE COCCI IN CLUSTERS CRITICAL RESULT CALLED TO, READ BACK BY AND VERIFIED WITH: L SEAY PHARMD 05/05/19 6314 JDW Performed at  [facility], [place] [address], [place], [place] [address]    Culture METHICILLIN RESISTANT STAPHYLOCOCCUS AUREUS (A)  Final   Report Status 05/08/2019 FINAL  Final   Organism ID, Bacteria METHICILLIN RESISTANT STAPHYLOCOCCUS AUREUS  Final      Susceptibility   Methicillin resistant staphylococcus aureus - MIC*    CIPROFLOXACIN >=8 RESISTANT Resistant  ERYTHROMYCIN >=8 RESISTANT Resistant     GENTAMICIN <=0.5 SENSITIVE Sensitive     OXACILLIN >=4 RESISTANT Resistant     TETRACYCLINE <=1 SENSITIVE Sensitive     VANCOMYCIN 1 SENSITIVE Sensitive     TRIMETH/SULFA <=10 SENSITIVE Sensitive     CLINDAMYCIN <=0.25 SENSITIVE Sensitive     RIFAMPIN <=0.5 SENSITIVE Sensitive     Inducible Clindamycin NEGATIVE Sensitive     * METHICILLIN RESISTANT STAPHYLOCOCCUS AUREUS  Blood Culture (routine x 2)     Status: None   Collection Time: 05/02/19  5:08 PM   Specimen: BLOOD  Result Value Ref Range Status   Specimen Description BLOOD BLOOD LEFT FOREARM  Final   Special Requests   Final    BOTTLES DRAWN AEROBIC AND ANAEROBIC Blood Culture adequate volume   Culture   Final    NO GROWTH 5 DAYS Performed at [facility], [place] [address] [place], [place] [address]    Report Status 05/07/2019 FINAL  Final  Urine culture     Status: Abnormal   Collection Time: 05/02/19  5:08 PM   Specimen: In/Out Cath Urine  Result Value Ref Range Status   Specimen Description IN/OUT CATH URINE  Final   Special Requests   Final    NONE Performed at [facility], [place] [address], [place], [place] [address]    Culture (A)  Final    5,000 COLONIES/mL ESCHERICHIA COLI Confirmed Extended Spectrum Beta-Lactamase Producer (ESBL).  In bloodstream infections from ESBL organisms, carbapenems are preferred over piperacillin/tazobactam. They are shown to have a lower risk of mortality.    Report Status 05/04/2019 FINAL  Final   Organism ID, Bacteria ESCHERICHIA COLI (A)  Final      Susceptibility    Escherichia coli - MIC*    AMPICILLIN >=32 RESISTANT Resistant     CEFAZOLIN >=64 RESISTANT Resistant     CEFTRIAXONE >=64 RESISTANT Resistant     CIPROFLOXACIN >=4 RESISTANT Resistant     GENTAMICIN <=1 SENSITIVE Sensitive     IMIPENEM <=0.25 SENSITIVE Sensitive     NITROFURANTOIN <=16 SENSITIVE Sensitive     TRIMETH/SULFA <=20 SENSITIVE Sensitive     AMPICILLIN/SULBACTAM 16 INTERMEDIATE Intermediate     PIP/TAZO <=4 SENSITIVE Sensitive     * 5,000 COLONIES/mL ESCHERICHIA COLI  SARS CORONAVIRUS 2 (TAT 6-24 HRS) Nasopharyngeal Nasopharyngeal Swab     Status: Abnormal   Collection Time: 05/02/19  6:26 PM   Specimen: Nasopharyngeal Swab  Result Value Ref Range Status   SARS Coronavirus 2 POSITIVE (A) NEGATIVE Final    Comment: RESULT CALLED TO, READ BACK BY AND VERIFIED WITH: H.MASHBURN RN [id number] 05/02/19 MCCORMICK K (NOTE) SARS-CoV-2 target nucleic acids are DETECTED. The SARS-CoV-2 RNA is generally detectable in upper and lower respiratory specimens during the acute phase of infection. Positive results are indicative of the presence of SARS-CoV-2 RNA. Clinical correlation with patient history and other diagnostic information is  necessary to determine patient infection status. Positive results do not rule out bacterial infection or co-infection with other viruses.  The expected result is Negative. Fact Sheet for Patients: SugarRoll.be Fact Sheet for Healthcare Providers: https://www.woods-mathews.com/ This test is not yet approved or cleared by the [place] FDA and  has been authorized for detection and/or diagnosis of SARS-CoV-2 by FDA under an Emergency Use Authorization (EUA). This EUA will remain  in effect (meaning this test can be used) for  the duration of the COVID-19 declaration under Section 564(b)(1) of the Act, 21  U.S.C. section 360bbb-3(b)(1), unless the authorization is terminated or revoked sooner. Performed at [facility], [place] [address] [place], [place] [address]   MRSA PCR Screening     Status: None   Collection Time: 05/02/19  8:30 PM   Specimen: Nasopharyngeal  Result Value Ref Range Status   MRSA by PCR NEGATIVE NEGATIVE Final    Comment:        The GeneXpert MRSA Assay (FDA approved for NASAL specimens only), is one component of a comprehensive MRSA colonization surveillance program. It is not intended to diagnose MRSA infection nor to guide or monitor treatment for MRSA infections. Performed at [facility], [place] [address] [place], [place] [address]   MRSA PCR Screening     Status: None   Collection Time: 05/05/19 11:06 AM   Specimen: Nasopharyngeal  Result Value Ref Range Status   MRSA by PCR NEGATIVE NEGATIVE Final    Comment:        The GeneXpert MRSA Assay (FDA approved for NASAL specimens only), is one component of a comprehensive MRSA colonization surveillance program. It is not intended to diagnose MRSA infection nor to guide or monitor treatment for MRSA infections. Performed at [facility], [place] [address] [place], [place] [address]   Culture, blood (Routine X 2) w Reflex to ID Panel     Status: None (Preliminary result)   Collection Time: 05/06/19 11:59 AM   Specimen: BLOOD  Result Value Ref Range Status   Specimen Description BLOOD RIGHT ANTECUBITAL  Final   Special Requests   Final    BOTTLES DRAWN AEROBIC ONLY Blood Culture adequate volume   Culture   Final    NO GROWTH 3 DAYS Performed at [facility], [place] [address], [place], [place] [address]    Report Status PENDING  Incomplete  Culture, blood (Routine X 2) w Reflex to ID Panel     Status: None (Preliminary result)   Collection Time: 05/06/19 11:59 AM   Specimen: BLOOD LEFT FOREARM  Result Value Ref Range Status   Specimen Description BLOOD LEFT FOREARM  Final   Special Requests   Final    BOTTLES DRAWN AEROBIC ONLY Blood Culture adequate volume   Culture   Final    NO  GROWTH 3 DAYS Performed at [facility], [place] [address], [place], [place] [address]    Report Status PENDING  Incomplete     Janene Madeira, MSN, NP-C [facility] for Infectious Disease [email or web address] Pager: [phone] Office: [phone] [place]: [phone]

## 2019-05-10 NOTE — Plan of Care (Signed)
  Problem: Safety: Goal: Ability to remain free from injury will improve Outcome: Progressing   Problem: Nutrition: Goal: Adequate nutrition will be maintained Outcome: Progressing   

## 2019-05-10 NOTE — Progress Notes (Signed)
Nutrition Follow-up  DOCUMENTATION CODES:   Severe malnutrition in context of chronic illness, Underweight  INTERVENTION:   -Continue MVI with minerals daily -Continue Ensure Enlive po TID, each supplement provides 350 kcal and 20 grams of protein -Continue Magic cup TID with meals, each supplement provides 290 kcal and 9 grams of protein  NUTRITION DIAGNOSIS:   Severe Malnutrition related to chronic illness(COPD) as evidenced by percent weight loss, moderate fat depletion, severe fat depletion, moderate muscle depletion, severe muscle depletion.  Ongoing  GOAL:   Patient will meet greater than or equal to 90% of their needs  Unmet  MONITOR:   PO intake, Supplement acceptance, Labs, Weight trends, Skin, I & O's  REASON FOR ASSESSMENT:   Other (Comment)    ASSESSMENT:   Paul Suarez is a 79 y.o. male with medical history significant for emphysema with chronic hypoxic respiratory failure, seizure disorder, hypertension, depression, coronary artery disease, and recent COVID-19 infection, now presenting from his SNF for evaluation of epistaxis.  Patient had been diagnosed with Covid approximately 3-4 weeks ago per report, had been treated with multiple rounds of antibiotics, was seen in the emergency department yesterday for possible hemoptysis and had CTA chest that was negative for large central PE but concerning for increased patchy bilateral airspace disease suggestive of a multifocal pneumonia.  He then developed epistaxis today at his nursing facility, was started on Afrin nasal spray, initially had difficulty controlling the bleeding, and EMS was called.  The bleeding had stopped, but the patient was noted to be tachycardic with low blood pressure and increased oxygen requirements, and was brought into the ED.  Patient denies any chest pain, reports that his cough and shortness of breath has been worse recently, denies any melena or hematochezia, and denies any abdominal  pain.  2/23- s/p MBSS- recommending regular diet with thin liquids  Reviewed I/O's: -518 ml x 24 hours and -1 L since admission  UOP: 1 L x 24 hours   Per SLP notes, pt has been placed on a pureed diet in the past, however, pt did not like the the diet and had very poor oral intake when he was prescribed it; pt daughter resting to avoid pureed diet if at all possible.   Spoke with pt at bedside, who reports feeling better today. He is not a good historian; he spoke with this RD about family members from [place] coming to visit him today. He reports he has a fair appetite and his daughter assists with ordering meals from him. Oral intake continues to decline; noted meal completion 0-10%. He is consuming Ensure supplements per MAR. Observed pt consumed about 75% of Magic Cup, which he reports he likes.   Labs reviewed.   NUTRITION - FOCUSED PHYSICAL EXAM:    Most Recent Value  Orbital Region  Severe depletion  Upper Arm Region  Severe depletion  Thoracic and Lumbar Region  Severe depletion  Buccal Region  Moderate depletion  Temple Region  Severe depletion  Clavicle Bone Region  Severe depletion  Clavicle and Acromion Bone Region  Severe depletion  Scapular Bone Region  Severe depletion  Dorsal Hand  Severe depletion  Patellar Region  Moderate depletion  Anterior Thigh Region  Moderate depletion  Posterior Calf Region  Moderate depletion  Edema (RD Assessment)  Mild  Hair  Reviewed  Eyes  Reviewed  Mouth  Reviewed  Skin  Reviewed  Nails  Reviewed       Diet Order:   Diet Order  Diet regular Room service appropriate? 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  Estimated Nutritional Needs:   Kcal:  2000-2200  Protein:  100-115 grams  Fluid:  > 2 L    05/12/19, RD, LDN, CDCES Registered Dietitian II Certified Diabetes Care and Education Specialist Please refer to [facility] for RD and/or RD on-call/weekend/after hours pager

## 2019-05-10 NOTE — Progress Notes (Signed)
PROGRESS NOTE    Paul Suarez  ZOX:[id number] DOB: Sep 07, 1940 DOA: 05/02/2019 PCP: Pecola Lawless, MD   Brief Narrative:  79 year old gentleman prior history of chronic respiratory failure with hypoxia on 2 L of nasal cannula oxygen at baseline, seizure, essential hypertension, coronary artery disease, recent COVID-19 infection, COPD presents from SNF for evaluation of epistaxis.  Patient was diagnosed with Covid about month ago and was treated with multiple rounds of antibiotics presents to ED for possible hemoptysis.  CT angiogram of the chest done on admission was negative for large central PE but was suspicious for multifocal pneumonia.  He also developed epistaxis and was brought to ED for further evaluation.  Patient is out of the 21 day period from the time he was tested positive and he does not require any isolation at this time.  He was admitted for sepsis secondary to pneumonia and was also found to have Staphylococcus bacteremia.  Hospital course was complicated by worsening respiratory distress with pneumonia, PCCM consulted and his CODE STATUS was changed to DNR.  They  suggested that patient is not a good candidate for invasive mechanical ventilation or CPR and family was in agreement with this,  they recommended bronchodilators, antibiotics and suctioning and in the event that he does not improve with these measures patient should be transitioned to comfort care.  Plan to place PICC line today and possible d/c to SNF early tomorrow .    Assessment & Plan:   Principal Problem:   Staphylococcus aureus bacteremia Active Problems:   Multifocal pneumonia   CAD (coronary artery disease)   Essential hypertension   COPD with acute exacerbation (HCC)   Major depression, recurrent (HCC)   Seizure disorder (HCC)   Severe sepsis (HCC)   Acute on chronic respiratory failure with hypoxia (HCC)   AKI (acute kidney injury) (HCC)   Acute blood loss anemia   Pressure injury of skin  Protein-calorie malnutrition, severe  Sepsis secondary to multifocal pneumonia and staphylococcus aureus bacteremia in 1/4 bottles. On admission patient presented with hypotension, tachycardia,  leukocytosis, lactic acidosis, AKI with increased oxygen requirements.  Chest x-ray showed bilateral worsening/multifocal pneumonia.  Patient was initially started on Rocephin and Zithromax.  His hospital course was complicated by MRSA bacteremia in 1 out of 4 blood culture bottles.  Infectious disease consulted and recommended IV vancomycin and PICC line placement. Recommend atleast 2 weeks of IV vancomycin treatment .  Echocardiogram ruled out vegetations. Patient's oxygen requirement is improving currently he is on 4 to 5 L of nasal cannula oxygen. Repeat blood cultures have been negative so far. Patient is afebrile and WBC count is improving. Repeat CBC in am.    AKI Probably secondary to prerenal azotemia from sepsis and hypotension Patient was adequately resuscitated with fluids and his creatinine is back to baseline. Repeat BMP tomorrow.    Epistaxis and hemoptysis Resolved and hemoglobin stable around 8.5.     COPD with acute exacerbation No wheezing heard on exam today, continue with bronchodilators and prednisone taper.    E. coli UTI Completed the course of antibiotics.   Essential hypertension Well controlled BP parameters.     Acute blood loss anemia probably from epistaxis. Hemoglobin stable around 8.5.  Transfuse to keep hemoglobin greater than 7.    Seizure disorder Continue with Depakote 500 mg daily.   Pressure injury  Pressure Injury 05/08/19 Coccyx Stage 2 -  Partial thickness loss of dermis presenting as a shallow open injury with a red, pink wound  bed without slough. (Active)  05/08/19 1140  Location: Coccyx  Location Orientation:   Staging: Stage 2 -  Partial thickness loss of dermis presenting as a shallow open injury with a red, pink wound bed without  slough.  Wound Description (Comments):   Present on Admission: No (found on nightshift, none noted on admission )  wound care consulted    Recent COVID-19 infection Patient was initially diagnosed with Covid on 04/09/2019 and he is out of the 21-day.  But less than 90 days.  Current respiratory symptoms are secondary to multifocal superimposed pneumonia which may be sequela of the recent Covid infection.    Coronary artery disease No chest pain today.     Moderate malnutrition Dietary consult recommendations appreciated.   DVT prophylaxis: SCDs, heparin sq.  Code Status: DNR Family Communication: None at bedside , discussed with daughter over the phone disposition Plan: Awaiting for PICC line placement and possible SNF placement in the next 24 hours   Consultants:   PCCM  Infectious disease  Procedures: None Antimicrobials: On vancomycin as per infectious disease recommendations for MRSA bacteremia.  Subjective: No complaints, on 5 lit of [place] oxygen.  No nausea, vomiting  Requesting help with feeding.   Objective: Vitals:   05/10/19 0100 05/10/19 0300 05/10/19 0500 05/10/19 0646  BP:  121/66 121/65   Pulse:  98 96   Resp: (!) 26 (!) 33 (!) 26   Temp:   97.9 F (36.6 C)   TempSrc:      SpO2:  95% 94%   Weight:    57.2 kg  Height:        Intake/Output Summary (Last 24 hours) at 05/10/2019 1222 Last data filed at 05/10/2019 0645 Gross per 24 hour  Intake 360 ml  Output 1001 ml  Net -641 ml   Filed Weights   05/08/19 0152 05/09/19 0422 05/10/19 0646  Weight: 57.1 kg 57 kg 57.2 kg    Examination:  General exam: Calm and comfortable on 5 L of nasal cannula oxygen, not in any kind of distress. Respiratory system: Diminished air entry at bases, no wheezing heard today no rhonchi. Cardiovascular system: S1-S2 heard, regular rate rhythm, no JVD. Gastrointestinal system: Abdomen is soft, nontender, nondistended bowel sounds normal Central nervous system: Alert  and answering questions appropriately, grossly nonfocal Extremities: No pedal edema Skin: Stage II sacral pressure injury present Psychiatry: Mood is appropriate    Data Reviewed: I have personally reviewed following labs and imaging studies  CBC: Recent Labs  Lab 05/04/19 0434 05/04/19 0742 05/06/19 0359 05/06/19 0359 05/07/19 0225 05/07/19 0225 05/08/19 0456 05/08/19 1928 05/09/19 0218 05/09/19 1017 05/09/19 1822 05/10/19 0210 05/10/19 1036  WBC 15.7*  --  16.2*  --  17.1*  --  20.7*  --  15.6*  --   --   --   --   NEUTROABS 13.6*  --  13.9*  --  14.9*  --  17.3*  --  13.1*  --   --   --   --   HGB 7.6*   < > 8.3*   < > 8.2*   < > 8.4*   < > 7.9* 9.2* 8.5* 8.2* 8.5*  HCT 22.9*   < > 26.0*   < > 25.9*   < > 27.0*   < > 25.5* 28.9* 26.5* 26.1* 27.2*  MCV 92.0  --  95.6  --  93.8  --  95.7  --  97.7  --   --   --   --  PLT 220  --  254  --  274  --  369  --  358  --   --   --   --    < > = values in this interval not displayed.   Basic Metabolic Panel: Recent Labs  Lab 05/04/19 0434 05/06/19 0359 05/07/19 0225 05/08/19 0456 05/09/19 0218  NA 140 139 139 139 139  K 4.1 4.6 4.4 3.7 4.4  CL 107 104 100 99 103  CO2 23 25 27 26 30   GLUCOSE 152* 122* 131* 172* 126*  BUN 34* 26* 27* 30* 31*  CREATININE 0.70 0.66 0.61 0.67 0.46*  CALCIUM 8.5* 8.5* 8.3* 8.6* 8.2*   GFR: Estimated Creatinine Clearance: 61.6 mL/min (A) (by C-G formula based on SCr of 0.46 mg/dL (L)). Liver Function Tests: Recent Labs  Lab 05/09/19 0218  AST 17  ALT 10  ALKPHOS 42  BILITOT 0.5  PROT 5.1*  ALBUMIN 2.1*   No results for input(s): LIPASE, AMYLASE in the last 168 hours. No results for input(s): AMMONIA in the last 168 hours. Coagulation Profile: Recent Labs  Lab 05/08/19 1928  INR 1.1   Cardiac Enzymes: No results for input(s): CKTOTAL, CKMB, CKMBINDEX, TROPONINI in the last 168 hours. BNP (last 3 results) No results for input(s): PROBNP in the last 8760 hours. HbA1C: No  results for input(s): HGBA1C in the last 72 hours. CBG: No results for input(s): GLUCAP in the last 168 hours. Lipid Profile: No results for input(s): CHOL, HDL, LDLCALC, TRIG, CHOLHDL, LDLDIRECT in the last 72 hours. Thyroid Function Tests: No results for input(s): TSH, T4TOTAL, FREET4, T3FREE, THYROIDAB in the last 72 hours. Anemia Panel: No results for input(s): VITAMINB12, FOLATE, FERRITIN, TIBC, IRON, RETICCTPCT in the last 72 hours. Sepsis Labs: No results for input(s): PROCALCITON, LATICACIDVEN in the last 168 hours.  Recent Results (from the past 240 hour(s))  Blood Culture (routine x 2)     Status: Abnormal   Collection Time: 05/02/19  5:06 PM   Specimen: BLOOD  Result Value Ref Range Status   Specimen Description BLOOD BLOOD RIGHT FOREARM  Final   Special Requests   Final    BOTTLES DRAWN AEROBIC AND ANAEROBIC Blood Culture adequate volume   Culture  Setup Time   Final    ANAEROBIC BOTTLE ONLY GRAM POSITIVE COCCI IN CLUSTERS CRITICAL RESULT CALLED TO, READ BACK BY AND VERIFIED WITH: L SEAY PHARMD 05/05/19 [id number] JDW Performed at [facility], [address] [place], [place] [id number]    Culture METHICILLIN RESISTANT STAPHYLOCOCCUS AUREUS (A)  Final   Report Status 05/08/2019 FINAL  Final   Organism ID, Bacteria METHICILLIN RESISTANT STAPHYLOCOCCUS AUREUS  Final      Susceptibility   Methicillin resistant staphylococcus aureus - MIC*    CIPROFLOXACIN >=8 RESISTANT Resistant     ERYTHROMYCIN >=8 RESISTANT Resistant     GENTAMICIN <=0.5 SENSITIVE Sensitive     OXACILLIN >=4 RESISTANT Resistant     TETRACYCLINE <=1 SENSITIVE Sensitive     VANCOMYCIN 1 SENSITIVE Sensitive     TRIMETH/SULFA <=10 SENSITIVE Sensitive     CLINDAMYCIN <=0.25 SENSITIVE Sensitive     RIFAMPIN <=0.5 SENSITIVE Sensitive     Inducible Clindamycin NEGATIVE Sensitive     * METHICILLIN RESISTANT STAPHYLOCOCCUS AUREUS  Blood Culture (routine x 2)     Status: None   Collection Time: 05/02/19  5:08  PM   Specimen: BLOOD  Result Value Ref Range Status   Specimen Description BLOOD BLOOD LEFT  FOREARM  Final   Special Requests   Final    BOTTLES DRAWN AEROBIC AND ANAEROBIC Blood Culture adequate volume   Culture   Final    NO GROWTH 5 DAYS Performed at [facility], [address], [place], [place] [address]    Report Status 05/07/2019 FINAL  Final  Urine culture     Status: Abnormal   Collection Time: 05/02/19  5:08 PM   Specimen: In/Out Cath Urine  Result Value Ref Range Status   Specimen Description IN/OUT CATH URINE  Final   Special Requests   Final    NONE Performed at [facility], [address] [place], [place] [address]    Culture (A)  Final    5,000 COLONIES/mL ESCHERICHIA COLI Confirmed Extended Spectrum Beta-Lactamase Producer (ESBL).  In bloodstream infections from ESBL organisms, carbapenems are preferred over piperacillin/tazobactam. They are shown to have a lower risk of mortality.    Report Status 05/04/2019 FINAL  Final   Organism ID, Bacteria ESCHERICHIA COLI (A)  Final      Susceptibility   Escherichia coli - MIC*    AMPICILLIN >=32 RESISTANT Resistant     CEFAZOLIN >=64 RESISTANT Resistant     CEFTRIAXONE >=64 RESISTANT Resistant     CIPROFLOXACIN >=4 RESISTANT Resistant     GENTAMICIN <=1 SENSITIVE Sensitive     IMIPENEM <=0.25 SENSITIVE Sensitive     NITROFURANTOIN <=16 SENSITIVE Sensitive     TRIMETH/SULFA <=20 SENSITIVE Sensitive     AMPICILLIN/SULBACTAM 16 INTERMEDIATE Intermediate     PIP/TAZO <=4 SENSITIVE Sensitive     * 5,000 COLONIES/mL ESCHERICHIA COLI  SARS CORONAVIRUS 2 (TAT 6-24 HRS) Nasopharyngeal Nasopharyngeal Swab     Status: Abnormal   Collection Time: 05/02/19  6:26 PM   Specimen: Nasopharyngeal Swab  Result Value Ref Range Status   SARS Coronavirus 2 POSITIVE (A) NEGATIVE Final    Comment: RESULT CALLED TO, READ BACK BY AND VERIFIED WITH: H.MASHBURN RN [id number] 05/02/19 MCCORMICK K (NOTE) SARS-CoV-2 target nucleic  acids are DETECTED. The SARS-CoV-2 RNA is generally detectable in upper and lower respiratory specimens during the acute phase of infection. Positive results are indicative of the presence of SARS-CoV-2 RNA. Clinical correlation with patient history and other diagnostic information is  necessary to determine patient infection status. Positive results do not rule out bacterial infection or co-infection with other viruses.  The expected result is Negative. Fact Sheet for Patients: HairSlick.no Fact Sheet for Healthcare Providers: quierodirigir.com This test is not yet approved or cleared by the [place] FDA and  has been authorized for detection and/or diagnosis of SARS-CoV-2 by FDA under an Emergency Use Authorization (EUA). This EUA will remain  in effect (meaning this test can be used) for  the duration of the COVID-19 declaration under Section 564(b)(1) of the Act, 21 U.S.C. section 360bbb-3(b)(1), unless the authorization is terminated or revoked sooner. Performed at [facility], [address], [place], [place] [address]   MRSA PCR Screening     Status: None   Collection Time: 05/02/19  8:30 PM   Specimen: Nasopharyngeal  Result Value Ref Range Status   MRSA by PCR NEGATIVE NEGATIVE Final    Comment:        The GeneXpert MRSA Assay (FDA approved for NASAL specimens only), is one component of a comprehensive MRSA colonization surveillance program. It is not intended to diagnose MRSA infection nor to guide or monitor treatment for MRSA infections. Performed at [facility], [address] [place], [place] [address]   MRSA PCR Screening     Status: None   Collection Time: 05/05/19 11:06 AM   Specimen: Nasopharyngeal  Result Value Ref Range Status   MRSA by PCR NEGATIVE NEGATIVE Final    Comment:        The GeneXpert MRSA Assay (FDA approved for NASAL specimens only), is one component of  a comprehensive MRSA colonization surveillance program. It is not intended to diagnose MRSA infection nor to guide or monitor treatment for MRSA infections. Performed at [facility], [address] [place], [place] [address]   Culture, blood (Routine X 2) w Reflex to ID Panel     Status: None (Preliminary result)   Collection Time: 05/06/19 11:59 AM   Specimen: BLOOD  Result Value Ref Range Status   Specimen Description BLOOD RIGHT ANTECUBITAL  Final   Special Requests   Final    BOTTLES DRAWN AEROBIC ONLY Blood Culture adequate volume   Culture   Final    NO GROWTH 3 DAYS Performed at [facility], [address] [place], [place] [address]    Report Status PENDING  Incomplete  Culture, blood (Routine X 2) w Reflex to ID Panel     Status: None (Preliminary result)   Collection Time: 05/06/19 11:59 AM   Specimen: BLOOD LEFT FOREARM  Result Value Ref Range Status   Specimen Description BLOOD LEFT FOREARM  Final   Special Requests   Final    BOTTLES DRAWN AEROBIC ONLY Blood Culture adequate volume   Culture   Final    NO GROWTH 3 DAYS Performed at [facility], [address] [place], [place] [address]    Report Status PENDING  Incomplete         Radiology Studies: DG CHEST PORT 1 VIEW  Result Date: 05/09/2019 CLINICAL DATA:  Sepsis, cough. EXAM: PORTABLE CHEST 1 VIEW COMPARISON:  May 06, 2019. FINDINGS: The heart size and mediastinal contours are within normal limits. No pneumothorax or pleural effusion is noted. Stable biapical scarring and pleural thickening is noted. Stable bilateral lung opacities are noted in the right lung base with left basilar region concerning for pneumonia. The visualized skeletal structures are unremarkable. IMPRESSION: Stable bilateral lung opacities are noted concerning for multifocal pneumonia. Electronically Signed   By: Lupita Raider M.D.   On: 05/09/2019 12:55   [place] EKG SITE RITE  Result Date: 05/09/2019 If [facility] image not attached, placement could not be confirmed due to current cardiac rhythm.       Scheduled Meds: . divalproex  500 mg Oral QHS  . feeding supplement (ENSURE ENLIVE)  237 mL Oral TID BM  . guaiFENesin  1,200 mg Oral BID  . heparin injection (subcutaneous)  5,000 Units Subcutaneous Q8H  . multivitamin with minerals  1 tablet Oral Daily  . predniSONE  40 mg Oral Q breakfast  . sertraline  25 mg Oral Daily  . sodium chloride flush  3 mL Intravenous Q12H  . sodium chloride flush  3 mL Intravenous Q12H  . umeclidinium bromide  1 puff Inhalation Daily   Continuous Infusions: . sodium chloride 250 mL (05/09/19 1051)  . vancomycin 1,000 mg (05/10/19 0921)     LOS: 8 days        Kathlen Mody, MD [facility]   To contact the attending provider between 7A-7P or the covering provider during after hours 7P-7A, please log into the web  site [email or web address] and access using universal [place] password for that web site. If you do not have the password, please call the hospital operator.  05/10/2019, 12:22 PM

## 2019-05-11 LAB — CBC
HCT: 28 % — ABNORMAL LOW (ref 39.0–52.0)
Hemoglobin: 8.9 g/dL — ABNORMAL LOW (ref 13.0–17.0)
MCH: 30.7 pg (ref 26.0–34.0)
MCHC: 31.8 g/dL (ref 30.0–36.0)
MCV: 96.6 fL (ref 80.0–100.0)
Platelets: 451 K/uL — ABNORMAL HIGH (ref 150–400)
RBC: 2.9 MIL/uL — ABNORMAL LOW (ref 4.22–5.81)
RDW: 19.1 % — ABNORMAL HIGH (ref 11.5–15.5)
WBC: 13.2 K/uL — ABNORMAL HIGH (ref 4.0–10.5)
nRBC: 0 % (ref 0.0–0.2)

## 2019-05-11 LAB — BASIC METABOLIC PANEL WITH GFR
Anion gap: 8 (ref 5–15)
BUN: 20 mg/dL (ref 8–23)
CO2: 27 mmol/L (ref 22–32)
Calcium: 8.1 mg/dL — ABNORMAL LOW (ref 8.9–10.3)
Chloride: 102 mmol/L (ref 98–111)
Creatinine, Ser: 0.59 mg/dL — ABNORMAL LOW (ref 0.61–1.24)
GFR calc Af Amer: >60 mL/min
GFR calc non Af Amer: >60 mL/min
Glucose, Bld: 80 mg/dL (ref 70–99)
Potassium: 4.1 mmol/L (ref 3.5–5.1)
Sodium: 137 mmol/L (ref 135–145)

## 2019-05-11 LAB — VANCOMYCIN, PEAK: Vancomycin Pk: 36 ug/mL (ref 30–40)

## 2019-05-11 LAB — VANCOMYCIN, TROUGH: Vancomycin Tr: 8 ug/mL — ABNORMAL LOW (ref 15–20)

## 2019-05-11 MED ORDER — PREDNISONE 20 MG PO TABS: 20.0000 mg | ORAL_TABLET | Freq: Every day | ORAL | 0 refills | Status: AC

## 2019-05-11 MED ORDER — CHLORHEXIDINE GLUCONATE CLOTH 2 % EX PADS
6.0000 | MEDICATED_PAD | Freq: Every day | CUTANEOUS | Status: DC
  Administered 2019-05-11: 6 via TOPICAL

## 2019-05-11 MED ORDER — SODIUM CHLORIDE 0.9% FLUSH
10.0000 mL | Freq: Two times a day (BID) | INTRAVENOUS | Status: DC
  Administered 2019-05-11: 10 mL

## 2019-05-11 MED ORDER — VANCOMYCIN IV (FOR PTA / DISCHARGE USE ONLY): 1000.0000 mg | INTRAVENOUS | 0 refills | Status: DC

## 2019-05-11 MED ORDER — ADULT MULTIVITAMIN W/MINERALS CH: 1.0000 | ORAL_TABLET | Freq: Every day | ORAL | Status: DC

## 2019-05-11 MED ORDER — SODIUM CHLORIDE 0.9% FLUSH: 10.0000 mL | INTRAVENOUS | Status: DC | PRN

## 2019-05-11 NOTE — TOC Transition Note (Signed)
Transition of Care [facility]) - CM/SW Discharge Note   Patient Details  Name: Selby Foisy MRN: [id number] Date of Birth: 1940-09-03  [facility]) CM/SW Contact:  Eduard Roux, LCSWA Phone Number: 05/11/2019, 2:20 PM   Clinical Narrative:      Patient will DC to: [facility] DC Date: 05/11/2019 Family Notified: [name],daughter Transport By: Sharin Mons - next available   Per MD patient is ready for discharge. RN, patient, and facility notified of DC. Discharge Summary sent to facility. RN given number for [phone], Room 713- ask for [address]. Ambulance transport requested for patient.   Clinical Social Worker signing off.  Antony Blackbird, MSW, LCSWA Clinical Social Worker    Final next level of care: Skilled Nursing Facility Barriers to Discharge: Continued Medical Work up   Patient Goals and CMS Choice   CMS Medicare.gov Compare Post Acute Care list provided to:: Patient Represenative (must comment)([name] (daughter)) Choice offered to / list presented to : Adult Children  Discharge Placement              Patient chooses bed at: [facility] [place] Patient to be transferred to facility by: [facility] Name of family member notified: [name],daughter Patient and family notified of of transfer: 05/11/19  Discharge Plan and Services     Post Acute Care Choice: Skilled Nursing Facility                               Social Determinants of Health (SDOH) Interventions     Readmission Risk Interventions No flowsheet data found.

## 2019-05-11 NOTE — Progress Notes (Signed)
Pharmacy Antibiotic Note  Paul Suarez is a 79 y.o. male admitted on 05/02/2019 with MRSA bacteremia.  Pharmacy has been consulted for Vancomycin dosing. Patient is discharging to SNF today. Renal function is stable with SCr- 0.59.   Levels collected today showed a trough of 8 (collected late) and a peak of 36 (collected about 30 minutes early) which resulted in an AUC of 472, which is therapeutic.      Plan: Continue  vancomycin 1000 mg IV Q 24 hrs  Patient discharged to SNF on 1000 mg Q 24 hours as appropriate  Height: 5\' 11"  (180.3 cm) Weight: 124 lb 12.5 oz (56.6 kg) IBW/kg (Calculated) : 75.3  Temp (24hrs), Avg:98.1 F (36.7 C), Min:97.4 F (36.3 C), Max:98.7 F (37.1 C)  Recent Labs  Lab 05/06/19 0359 05/07/19 0225 05/08/19 0456 05/09/19 0218 05/11/19 0519 05/11/19 1032 05/11/19 1320  WBC 16.2* 17.1* 20.7* 15.6* 13.2*  --   --   CREATININE 0.66 0.61 0.67 0.46* 0.59*  --   --   VANCOTROUGH  --   --   --   --   --  8*  --   VANCOPEAK  --   --   --   --   --   --  36    Estimated Creatinine Clearance: 60.9 mL/min (A) (by C-G formula based on SCr of 0.59 mg/dL (L)).    No Known Allergies  Antimicrobials this admission: Vancomycin 2/17, 2/21 >>(3/7) Cefepime 2/17 x1 Azithromycin 2/17 >> 2/20 Ceftriaxone 2/18 >> 2/20 Zosyn 2/20 >>2/21 Cefazolin 2/21 >> 2/23  Dose adjustments this admission: N/A  Microbiology results: 2/17 BCx: 1/4 MRSA 2/17 UCx: ESBL Ecoli (likely colonization)  2/20 MRSA PCR: neg 2/21 BCx: NGTD   3/21, PharmD, BCPS, BCIDP Infectious Diseases Clinical Pharmacist Phone: [phone] 05/11/2019  2:49 PM  Please check [email or web address] for unit-specific pharmacy phone numbers.

## 2019-05-11 NOTE — TOC Progression Note (Signed)
Transition of Care [facility]) - Progression Note    Patient Details  Name: Pershing Skidmore MRN: [id number] Date of Birth: 10/25/1940  [facility]) CM/SW Contact  Eduard Roux, [place] Phone Number: 05/11/2019, 10:26 AM  Clinical Narrative:     CSW called patient's daughter,[name]. CSW informed the patient has been discharged and a SNF choice is needed as soon as possible. Patient's daughter states she is on the way to the hospital and she will make a decision with in the next hour.   CSW will continue to follow and assist with discharge planning.   Antony Blackbird, MSW, LCSWA Clinical Social Worker   Expected Discharge Plan: Skilled Nursing Facility Barriers to Discharge: Continued Medical Work up  Expected Discharge Plan and Services Expected Discharge Plan: Skilled Nursing Facility     Post Acute Care Choice: Skilled Nursing Facility Living arrangements for the past 2 months: Skilled Nursing Facility([facility]) Expected Discharge Date: 05/11/19                                     Social Determinants of Health (SDOH) Interventions    Readmission Risk Interventions No flowsheet data found.

## 2019-05-11 NOTE — Discharge Summary (Signed)
Physician Discharge Summary  Paul Suarez UVO:[id number] DOB: November 15, 1940 DOA: 05/02/2019  PCP: Hendricks Limes, MD  Admit date: 05/02/2019 Discharge date: 05/11/2019  Admitted From: Home.  Disposition : snf.  Recommendations for Outpatient Follow-up:  1. Follow up with PCP in 1-2 weeks 2. Please obtain BMP/CBC in one week Please follow up with Infectious disease as recommended.    Discharge Condition:guarded.  CODE STATUS:DNR Diet recommendation: Heart Healthy  Brief/Interim Summary: 79 year old gentleman prior history of chronic respiratory failure with hypoxia on 2 L of nasal cannula oxygen at baseline, seizure, essential hypertension, coronary artery disease, recent COVID-19 infection, COPD presents from SNF for evaluation of epistaxis.  Patient was diagnosed with Covid about month ago and was treated with multiple rounds of antibiotics presents to ED for possible hemoptysis.  CT angiogram of the chest done on admission was negative for large central PE but was suspicious for multifocal pneumonia.  He also developed epistaxis and was brought to ED for further evaluation.  Patient is out of the 21 day period from the time he was tested positive and he does not require any isolation at this time.  He was admitted for sepsis secondary to pneumonia and was also found to have Staphylococcus bacteremia.  Hospital course was complicated by worsening respiratory distress with pneumonia, PCCM consulted and his CODE STATUS was changed to DNR.  They  suggested that patient is not a good candidate for invasive mechanical ventilation or CPR and family was in agreement with this,  they recommended bronchodilators, antibiotics and suctioning and in the event that he does not improve with these measures patient should be transitioned to comfort care.  Plan to place PICC line today and possible d/c to SNF today.   Discharge Diagnoses:  Principal Problem:   Staphylococcus aureus bacteremia Active Problems:    Multifocal pneumonia   CAD (coronary artery disease)   Essential hypertension   COPD with acute exacerbation (HCC)   Major depression, recurrent (HCC)   Seizure disorder ([place])   Severe sepsis ([place])   Acute on chronic respiratory failure with hypoxia (HCC)   AKI (acute kidney injury) ([place])   Acute blood loss anemia   Pressure injury of skin   Protein-calorie malnutrition, severe  Sepsis secondary to multifocal pneumonia and staphylococcus aureus bacteremia in 1/4 bottles. On admission patient presented with hypotension, tachycardia,  leukocytosis, lactic acidosis, AKI with increased oxygen requirements.  Chest x-ray showed bilateral worsening/multifocal pneumonia.  Patient was initially started on Rocephin and Zithromax.  His hospital course was complicated by MRSA bacteremia in 1 out of 4 blood culture bottles.  Infectious disease consulted and recommended IV vancomycin and PICC line placement. Recommend atleast 2 weeks of IV vancomycin treatment .  Echocardiogram ruled out vegetations. Patient's oxygen requirement is improving currently he is on 4 to 5 L of nasal cannula oxygen. Repeat blood cultures have been negative so far. Patient is afebrile and WBC count is improving.    AKI Probably secondary to prerenal azotemia from sepsis and hypotension Patient was adequately resuscitated with fluids and his creatinine is back to baseline. Repeat BMP tomorrow.    Epistaxis and hemoptysis Resolved and hemoglobin stable around 8.5.     COPD with acute exacerbation No wheezing heard on exam today, continue with bronchodilators and prednisone taper.    E. coli UTI Completed the course of antibiotics.   Essential hypertension Well controlled BP parameters.     Acute blood loss anemia probably from epistaxis. Hemoglobin stable around 8.5.  Transfuse  to keep hemoglobin greater than 7.    Seizure disorder Continue with Depakote 500 mg daily.   Pressure  injury  Pressure Injury 05/08/19 Coccyx Stage 2 -  Partial thickness loss of dermis presenting as a shallow open injury with a red, pink wound bed without slough. (Active)  05/08/19 1140  Location: Coccyx  Location Orientation:   Staging: Stage 2 -  Partial thickness loss of dermis presenting as a shallow open injury with a red, pink wound bed without slough.  Wound Description (Comments):   Present on Admission: No (found on nightshift, none noted on admission )  wound care consulted    Recent COVID-19 infection Patient was initially diagnosed with Covid on 04/09/2019 and he is out of the 21-day.  But less than 90 days.  Current respiratory symptoms are secondary to multifocal superimposed pneumonia which may be sequela of the recent Covid infection.   Coronary artery disease No chest pain today.     Severe malnutrition Dietary consult recommendations appreciated.    Discharge Instructions  Discharge Instructions    Diet - low sodium heart healthy   Complete by: As directed    Discharge instructions   Complete by: As directed    Please follow up with Infectious disease upon completion of antibiotics.   Home infusion instructions   Complete by: As directed    Instructions: Flushing of vascular access device: 0.9% NaCl pre/post medication administration and prn patency; Heparin 100 u/ml, 34m for implanted ports and Heparin 10u/ml, 552mfor all other central venous catheters.     Allergies as of 05/11/2019   No Known Allergies     Medication List    STOP taking these medications   aspirin EC 81 MG tablet   dexamethasone 2 MG tablet Commonly known as: DECADRON   Probiotic 250 MG Caps     TAKE these medications   albuterol 108 (90 Base) MCG/ACT inhaler Commonly known as: VENTOLIN HFA Inhale 2 puffs into the lungs every 6 (six) hours as needed for shortness of breath (FOR Shortness of Breath  (S.O.B.)).   cholecalciferol 25 MCG (1000 UNIT) tablet Commonly  known as: VITAMIN D Take 1 tablet (1,000 Units total) by mouth daily.   clopidogrel 75 MG tablet Commonly known as: Plavix Take 1 tablet (75 mg total) by mouth daily.   divalproex 500 MG 24 hr tablet Commonly known as: Depakote ER Take 1 tablet (500 mg total) by mouth at bedtime.   docusate sodium 100 MG capsule Commonly known as: Colace Take 1 capsule (100 mg total) by mouth at bedtime.   guaiFENesin 100 MG/5ML liquid Commonly known as: ROBITUSSIN Take 200 mg by mouth 4 (four) times daily as needed for cough or congestion.   ipratropium-albuterol 0.5-2.5 (3) MG/3ML Soln Commonly known as: DUONEB Take 3 mLs by nebulization 4 (four) times daily. For SOB and Wheezing   loratadine 10 MG tablet Commonly known as: CLARITIN Take 1 tablet (10 mg total) by mouth daily.   magnesium hydroxide 400 MG/5ML suspension Commonly known as: MILK OF MAGNESIA If no BM in 3 days, give 30 cc Milk of Magnesium p.o. x 1 dose in 24 hours as needed (Do not use standing constipation orders for residents with renal failure CFR less than 30. Contact MD for orders) (Physician Order)   Melatonin 3 MG Tabs Take 3 mg by mouth at bedtime as needed (sleep).   multivitamin with minerals Tabs tablet Take 1 tablet by mouth daily.   Nutritional Supplement Liqd Take  120 mLs by mouth 2 (two) times daily. MedPass   NUTRITIONAL SUPPLEMENT PO Take 1 each by mouth daily. Magic Cup   OXYGEN Inhale 2 L into the lungs. O2 at 2L/min via [place] continuously (portable & stationary oncentrators)   oxymetazoline 0.05 % nasal spray Commonly known as: AFRIN Place 2 sprays into both nostrils 2 (two) times daily.   polyethylene glycol powder 17 GM/SCOOP powder Commonly known as: GLYCOLAX/MIRALAX Take 17 g by mouth daily.   predniSONE 20 MG tablet Commonly known as: DELTASONE Take 1 tablet (20 mg total) by mouth daily with breakfast for 3 days.   sertraline 50 MG tablet Commonly known as: ZOLOFT Take 25 mg by mouth  daily.   Spiriva HandiHaler 18 MCG inhalation capsule Generic drug: tiotropium Place 1 capsule (18 mcg total) into inhaler and inhale daily.   vancomycin  IVPB Inject 1,000 mg into the vein daily for 11 days. Indication:  MRSA bacteremia secondary to pneumonia  Last Day of Therapy:  05/20/19 Labs - Sunday/Monday:  CBC/D, BMP, and vancomycin trough. Labs - Thursday:  BMP and vancomycin trough Labs - Every other week:  ESR and CRP   vitamin C 500 MG tablet Commonly known as: ASCORBIC ACID Take 1 tablet (500 mg total) by mouth daily.   zinc sulfate 220 (50 Zn) MG capsule Take 220 mg by mouth daily.            Home Infusion Instuctions  (From admission, onward)         Start     Ordered   05/11/19 0000  Home infusion instructions    Question:  Instructions  Answer:  Flushing of vascular access device: 0.9% NaCl pre/post medication administration and prn patency; Heparin 100 u/ml, 53m for implanted ports and Heparin 10u/ml, 569mfor all other central venous catheters.   05/11/19 0914         Follow-up [facility] Follow up on 05/21/2019.   Specialty: Infectious Diseases Why: Appointment @ 3:15 pm with StJanene MadeiraNP  Contact information: [phone]     HoHendricks LimesMD. Schedule an appointment as soon as possible for a visit in 1 week(s).   Specialty: Internal Medicine Contact information: [phone]        No Known Allergies  Consultations:  Infectious disease.    Procedures/Studies: CT Angio Chest PE W and/or Wo Contrast  Result Date: 05/01/2019 CLINICAL DATA:  Hemoptysis. EXAM: CT ANGIOGRAPHY CHEST WITH CONTRAST TECHNIQUE: Multidetector CT imaging of the chest was performed using the standard protocol during bolus administration of intravenous contrast. Multiplanar CT  image reconstructions and MIPs were obtained to evaluate the vascular anatomy. CONTRAST:  10085mMNIPAQUE IOHEXOL 350 MG/ML SOLN COMPARISON:  Chest x-ray earlier same day FINDINGS: Cardiovascular: The heart size is normal. No substantial pericardial effusion. Coronary artery calcification is evident. Atherosclerotic calcification is noted in the wall of the thoracic aorta. No large central pulmonary embolus. No evidence for upper lobe pulmonary embolic disease. Assessment of lower lobes is markedly degraded by breathing motion making assessment of lower lobe segmental and subsegmental pulmonary arteries unreliable. Mediastinum/Nodes: 13 mm short axis right hilar node is associated with a 18 mm short axis subcarinal lymph node. No left hilar lymphadenopathy. Moderate to large hiatal hernia. There is no axillary lymphadenopathy. Lungs/Pleura: Centrilobular and paraseptal emphysema evident. Pleural  scarring noted anterior right hemithorax at the base potentially from prior empyema or hemothorax. Patchy and areas of confluent airspace disease noted posterior right upper lobe, posterior right lower lobe, and posterior left lower lobe. Airspace disease has a peripheral predominance. No pleural effusion. Biapical pleuroparenchymal scarring noted. Upper Abdomen: Small hypoattenuating lesions in the liver cannot be definitively characterized but approach water density and are likely cysts. Probable cysts noted posterior interpolar right kidney and upper pole left kidney Musculoskeletal: No worrisome lytic or sclerotic osseous abnormality. Bones are diffusely demineralized. Compression fractures are noted at T11, T12, L1 and T6. Review of the MIP images confirms the above findings. IMPRESSION: 1. No large central pulmonary embolus or evidence for pulmonary embolus to either upper lobe. Assessment of segmental and subsegmental pulmonary arteries to the lower lobes is unreliable due to respiratory breathing motion throughout  image acquisition. 2. Fairly marked emphysema with patchy bilateral airspace disease, right greater than left. Airspace opacity is confluent in some regions of the posterior right lower lobe. Multifocal pneumonia is a primary consideration. Distribution is not central as typically seen in the setting of pulmonary hemorrhage. Close follow-up recommended to ensure resolution. 3. Mediastinal and right hilar lymphadenopathy, potentially reactive. Attention on follow-up recommended to ensure resolution. 4.  Aortic Atherosclerois (ICD10-170.0) 5. Osteopenia with multiple thoracolumbar compression fractures. Electronically Signed   By: Misty Stanley M.D.   On: 05/01/2019 19:26   DG CHEST PORT 1 VIEW  Result Date: 05/09/2019 CLINICAL DATA:  Sepsis, cough. EXAM: PORTABLE CHEST 1 VIEW COMPARISON:  May 06, 2019. FINDINGS: The heart size and mediastinal contours are within normal limits. No pneumothorax or pleural effusion is noted. Stable biapical scarring and pleural thickening is noted. Stable bilateral lung opacities are noted in the right lung base with left basilar region concerning for pneumonia. The visualized skeletal structures are unremarkable. IMPRESSION: Stable bilateral lung opacities are noted concerning for multifocal pneumonia. Electronically Signed   By: Marijo Conception M.D.   On: 05/09/2019 12:55   DG CHEST PORT 1 VIEW  Result Date: 05/06/2019 CLINICAL DATA:  Hypoxia and shortness of breath EXAM: PORTABLE CHEST 1 VIEW COMPARISON:  Chest radiograph dated 05/04/2019 FINDINGS: The heart size and mediastinal contours are within normal limits. Airspace opacities in the right mid lung have increased since prior exam. Right mid and left lower lobe reticular opacities are not significantly changed. There is no pleural effusion or pneumothorax. A hiatal hernia is unchanged. IMPRESSION: Worsening airspace opacities in the right mid lung, which may represent pneumonia. Unchanged right mid and left lower  lobe reticular opacities. Electronically Signed   By: Zerita Boers M.D.   On: 05/06/2019 13:30   DG CHEST PORT 1 VIEW  Result Date: 05/04/2019 CLINICAL DATA:  Shortness of breath EXAM: PORTABLE CHEST 1 VIEW COMPARISON:  05/02/2019 FINDINGS: Stable cardiomediastinal contours. Moderate-sized hiatal hernia. Interval worsening of left lower lobe airspace opacity. The right upper lobe airspace opacity has improved compared to the prior study with a small amount of residual consolidation persisting. No large pleural fluid collection. No pneumothorax. IMPRESSION: 1. Interval worsening of left lower lobe airspace opacity. 2. Improving right upper lobe airspace opacity with a small amount of residual consolidation. Electronically Signed   By: Davina Poke D.O.   On: 05/04/2019 14:18   DG Chest Port 1 View  Result Date: 05/02/2019 CLINICAL DATA:  79 year old male with hypoxia. EXAM: PORTABLE CHEST 1 VIEW COMPARISON:  Chest radiograph dated 05/01/2019 and CT dated 05/01/2019. FINDINGS: There is background  of emphysema. There has been overall interval worsening of the airspace opacities involving the right upper lobe and lingula compared to the prior radiograph. No large pleural effusion. No pneumothorax. Stable cardiomediastinal silhouette. Moderate size hiatal hernia. No acute osseous pathology. IMPRESSION: Overall interval worsening of bilateral pulmonary airspace opacities compared to the radiograph of 02/28/2020. Clinical correlation and continued follow-up recommended. Electronically Signed   By: Anner Crete M.D.   On: 05/02/2019 17:35   DG Chest Portable 1 View  Result Date: 05/01/2019 CLINICAL DATA:  Possible hemoptysis, COVID positive EXAM: PORTABLE CHEST 1 VIEW COMPARISON:  None. FINDINGS: There is increased density in the mid right lung and left greater than right lung bases. No pleural effusion or pneumothorax. Heart size is normal. IMPRESSION: Bilateral pulmonary opacities, which may reflect  atypical pneumonia. Component of hemorrhage is also possible. Electronically Signed   By: Macy Mis M.D.   On: 05/01/2019 16:52   DG Swallowing Func-Speech Pathology  Result Date: 05/08/2019 Objective Swallowing Evaluation: Type of Study: MBS-Modified Barium Swallow Study  Patient Details Name: Paul Suarez MRN: [id number] Date of Birth: 01/04/1941 Today's Date: 05/08/2019 Time: SLP Start Time (ACUTE ONLY): 0845 -SLP Stop Time (ACUTE ONLY): 0905 SLP Time Calculation (min) (ACUTE ONLY): 20 min Past Medical History: Past Medical History: Diagnosis Date . CAD (coronary artery disease)  . COPD (chronic obstructive pulmonary disease) ([place])  . Depression  . Essential hypertension  . Seizure disorder ([place])  . Staphylococcus aureus bacteremia 05/06/2019 Past Surgical History: Past Surgical History: Procedure Laterality Date . BELOW KNEE LEG AMPUTATION Left  HPI: Paul Suarez is a 79 y.o. gentleman with history of CAD and PVD s/p BKA, Severe centrilobular emphysema on home oxygen 2LNC, who lives in a SNF.He was previously diagnosed with COVID 19 pneumonia. Has a history of recurrent resistant ESBL producing organisms. Recent outpatient anitbiotics courses for concern for post-covid bacterial pneumonia. Having epistaxis which was refractory and prompted ED visit. He was started on CAP coverage with ceftriaxone/azithromycin and steroids for COPD exacerbation. Progressively worsening hypoxemia over the last 3 days PTA. Possible acute aspiration event (secretions?) and now on more oxygen. Also found to have blood cultures positive for Staph aureus - sensitivities pending.  No data recorded Assessment / Plan / Recommendation CHL IP CLINICAL IMPRESSIONS 05/08/2019 Clinical Impression Pt demonstrates normal swallow function for age. Strength WNL, no significant residue. There are instances of slightly slower laryngeal elevation in the swallow, but again WNL for age and did not result in any penetration or aspiraiton. Pt able to  masticate soft solids with extra time despite missing dentition. Recommend pt continue regular/thin unrestricted diet with family assist to choose appropriate foods, discussed with dtr. No SLP f/u needed, will sign off.  SLP Visit Diagnosis Dysphagia, unspecified (R13.10) Attention and concentration deficit following -- Frontal lobe and executive function deficit following -- Impact on safety and function Mild aspiration risk   CHL IP TREATMENT RECOMMENDATION 05/08/2019 Treatment Recommendations No treatment recommended at this time   No flowsheet data found. CHL IP DIET RECOMMENDATION 05/08/2019 SLP Diet Recommendations Regular solids;Thin liquid Liquid Administration via Cup;Straw Medication Administration Whole meds with liquid Compensations Slow rate;Small sips/bites Postural Changes --   No flowsheet data found.  CHL IP FOLLOW UP RECOMMENDATIONS 05/08/2019 Follow up Recommendations None   No flowsheet data found.     CHL IP ORAL PHASE 05/08/2019 Oral Phase WFL Oral - Pudding Teaspoon -- Oral - Pudding Cup -- Oral - Honey Teaspoon -- Oral - Honey Cup -- Oral -  Nectar Teaspoon -- Oral - Nectar Cup -- Oral - Nectar Straw -- Oral - Thin Teaspoon -- Oral - Thin Cup -- Oral - Thin Straw -- Oral - Puree -- Oral - Mech Soft -- Oral - Regular -- Oral - Multi-Consistency -- Oral - Pill -- Oral Phase - Comment --  CHL IP PHARYNGEAL PHASE 05/08/2019 Pharyngeal Phase WFL Pharyngeal- Pudding Teaspoon -- Pharyngeal -- Pharyngeal- Pudding Cup -- Pharyngeal -- Pharyngeal- Honey Teaspoon -- Pharyngeal -- Pharyngeal- Honey Cup -- Pharyngeal -- Pharyngeal- Nectar Teaspoon -- Pharyngeal -- Pharyngeal- Nectar Cup -- Pharyngeal -- Pharyngeal- Nectar Straw -- Pharyngeal -- Pharyngeal- Thin Teaspoon -- Pharyngeal -- Pharyngeal- Thin Cup -- Pharyngeal -- Pharyngeal- Thin Straw -- Pharyngeal -- Pharyngeal- Puree -- Pharyngeal -- Pharyngeal- Mechanical Soft -- Pharyngeal -- Pharyngeal- Regular -- Pharyngeal -- Pharyngeal- Multi-consistency --  Pharyngeal -- Pharyngeal- Pill -- Pharyngeal -- Pharyngeal Comment --  No flowsheet data found. Herbie Baltimore, MA CCC-SLP Acute Rehabilitation Services Pager [phone] Office [phone] Lynann Beaver 05/08/2019, 10:04 AM              ECHOCARDIOGRAM LIMITED  Result Date: 05/07/2019    ECHOCARDIOGRAM LIMITED REPORT   Patient Name:   Paul Suarez Date of Exam: 05/07/2019 Medical Rec #:  [id number]  Height:       71.0 in Accession #:    [id number] Weight:       129.9 lb Date of Birth:  1940-10-19  BSA:          1.755 m Patient Age:    80 years   BP:           119/84 mmHg Patient Gender: M          HR:           103 bpm. Exam Location:  Inpatient Procedure: Limited Echo, Limited Color Doppler and Cardiac Doppler Indications:    Bacteremia 790.7 / R78.81  History:        Patient has no prior history of Echocardiogram examinations.                 CAD, COPD; Risk Factors:Hypertension. COVID19 Positive.                 Seizures.  Sonographer:    Jonelle Sidle Dance Referring Phys: [place]  1. Left ventricular ejection fraction, by estimation, is 60 to 65%. The left ventricle has normal function. The left ventricle has no regional wall motion abnormalities. Left ventricular diastolic parameters are consistent with Grade I diastolic dysfunction (impaired relaxation).  2. Right ventricular systolic function is normal. The right ventricular size is normal.  3. The mitral valve is degenerative. There is moderate calcification of the mitral valve leaflet(s). Moderate mitral subvalvular calcification. Moderate mitral subvalvular thickening/fibrosis. No evidence of mitral valve regurgitation. No evidence of mitral stenosis.  4. The aortic valve was not well visualized. Aortic valve regurgitation is not visualized. No aortic stenosis is present.  5. The inferior vena cava is normal in size with greater than 50% respiratory variability, suggesting right atrial pressure of 3 mmHg.  6. Poorly  visualized MV ad AV. Consider TEE if clinically indicated to rule out vegetation. FINDINGS  Left Ventricle: Left ventricular ejection fraction, by estimation, is 60 to 65%. The left ventricle has normal function. The left ventricle has no regional wall motion abnormalities. The left ventricular internal cavity size was normal in size. There is  no left ventricular hypertrophy. Indeterminate filling pressures. Right Ventricle: The  right ventricular size is normal. No increase in right ventricular wall thickness. Right ventricular systolic function is normal. Left Atrium: Left atrial size was normal in size. Right Atrium: Right atrial size was normal in size. Pericardium: There is no evidence of pericardial effusion. Mitral Valve: The mitral valve is degenerative in appearance. There is moderate thickening of the mitral valve leaflet(s). There is moderate calcification of the mitral valve leaflet(s). Normal mobility of the mitral valve leaflets. Severe mitral annular  calcification. Moderate mitral subvalvular calcification. Moderate mitral subvalvular thickening/fibrosis. No evidence of mitral valve stenosis. Tricuspid Valve: The tricuspid valve is normal in structure. Tricuspid valve regurgitation is not demonstrated. No evidence of tricuspid stenosis. Aortic Valve: The aortic valve was not well visualized. . There is moderate thickening and moderate calcification of the aortic valve. Aortic valve regurgitation is not visualized. No aortic stenosis is present. Moderate aortic valve annular calcification. There is moderate thickening of the aortic valve. There is moderate calcification of the aortic valve. Pulmonic Valve: The pulmonic valve was normal in structure. Pulmonic valve regurgitation is not visualized. No evidence of pulmonic stenosis. Aorta: The aortic root is normal in size and structure. Venous: The inferior vena cava is normal in size with greater than 50% respiratory variability, suggesting right  atrial pressure of 3 mmHg. IAS/Shunts: No atrial level shunt detected by color flow Doppler.  LEFT VENTRICLE PLAX 2D LVIDd:         3.60 cm LVIDs:         3.00 cm LV PW:         0.90 cm LV IVS:        1.00 cm LVOT diam:     2.00 cm LV SV:         80.58 ml LV SV Index:   45.92 LVOT Area:     3.14 cm  RIGHT VENTRICLE RV Basal diam:  2.10 cm LEFT ATRIUM             Index       RIGHT ATRIUM          Index LA diam:        3.60 cm 2.05 cm/m  RA Area:     8.72 cm LA Vol (A2C):   39.5 ml 22.51 ml/m RA Volume:   13.20 ml 7.52 ml/m LA Vol (A4C):   19.5 ml 11.11 ml/m LA Biplane Vol: 27.8 ml 15.84 ml/m  AORTIC VALVE LVOT Vmax:   119.00 cm/s LVOT Vmean:  79.200 cm/s LVOT VTI:    0.256 m  AORTA Ao Root diam: 3.60 cm Ao Asc diam:  3.20 cm MITRAL VALVE MV Area (PHT): 2.34 cm     SHUNTS MV Decel Time: 324 msec     Systemic VTI:  0.26 m MV E velocity: 98.00 cm/s   Systemic Diam: 2.00 cm MV A velocity: 154.00 cm/s MV E/A ratio:  0.64 Fransico Him MD Electronically signed by Fransico Him MD Signature Date/Time: 05/07/2019/12:40:44 PM    Final    [place] EKG SITE RITE  Result Date: 05/09/2019 If Site Rite image not attached, placement could not be confirmed due to current cardiac rhythm.    Subjective:  In good spirits, no new complaints.  Discharge Exam: Vitals:   05/11/19 0430 05/11/19 0843  BP: 121/70   Pulse: 95   Resp: (!) 26   Temp: 98.1 F (36.7 C) (!) 97.4 F (36.3 C)  SpO2: 98%    Vitals:   05/11/19 0027 05/11/19 0255 05/11/19 0430 05/11/19  0843  BP:   121/70   Pulse: 98  95   Resp: (!) 27  (!) 26   Temp:   98.1 F (36.7 C) (!) 97.4 F (36.3 C)  TempSrc:   Oral Oral  SpO2: 97%  98%   Weight:  56.6 kg    Height:        General: Pt is alert, awake, not in acute distress Cardiovascular: RRR, S1/S2 +, Respiratory: scattered rhonchi, air entry fair.  Abdominal: Soft, NT, ND, bowel sounds + Extremities: no edema, no cyanosis    The results of significant diagnostics from this  hospitalization (including imaging, microbiology, ancillary and laboratory) are listed below for reference.     Microbiology: Recent Results (from the past 240 hour(s))  Blood Culture (routine x 2)     Status: Abnormal   Collection Time: 05/02/19  5:06 PM   Specimen: BLOOD  Result Value Ref Range Status   Specimen Description BLOOD BLOOD RIGHT FOREARM  Final   Special Requests   Final    BOTTLES DRAWN AEROBIC AND ANAEROBIC Blood Culture adequate volume   Culture  Setup Time   Final    ANAEROBIC BOTTLE ONLY GRAM POSITIVE COCCI IN CLUSTERS CRITICAL RESULT CALLED TO, READ BACK BY AND VERIFIED WITH: L SEAY PHARMD 05/05/19 1884 JDW Performed at [facility], [place] [address], [place], [place] [address]    Culture METHICILLIN RESISTANT STAPHYLOCOCCUS AUREUS (A)  Final   Report Status 05/08/2019 FINAL  Final   Organism ID, Bacteria METHICILLIN RESISTANT STAPHYLOCOCCUS AUREUS  Final      Susceptibility   Methicillin resistant staphylococcus aureus - MIC*    CIPROFLOXACIN >=8 RESISTANT Resistant     ERYTHROMYCIN >=8 RESISTANT Resistant     GENTAMICIN <=0.5 SENSITIVE Sensitive     OXACILLIN >=4 RESISTANT Resistant     TETRACYCLINE <=1 SENSITIVE Sensitive     VANCOMYCIN 1 SENSITIVE Sensitive     TRIMETH/SULFA <=10 SENSITIVE Sensitive     CLINDAMYCIN <=0.25 SENSITIVE Sensitive     RIFAMPIN <=0.5 SENSITIVE Sensitive     Inducible Clindamycin NEGATIVE Sensitive     * METHICILLIN RESISTANT STAPHYLOCOCCUS AUREUS  Blood Culture (routine x 2)     Status: None   Collection Time: 05/02/19  5:08 PM   Specimen: BLOOD  Result Value Ref Range Status   Specimen Description BLOOD BLOOD LEFT FOREARM  Final   Special Requests   Final    BOTTLES DRAWN AEROBIC AND ANAEROBIC Blood Culture adequate volume   Culture   Final    NO GROWTH 5 DAYS Performed at [facility], [address] [place], [place] [address]    Report Status 05/07/2019 FINAL  Final  Urine culture     Status: Abnormal    Collection Time: 05/02/19  5:08 PM   Specimen: In/Out Cath Urine  Result Value Ref Range Status   Specimen Description IN/OUT CATH URINE  Final   Special Requests   Final    NONE Performed at [facility], [place] [address] [place], [place] [address]    Culture (A)  Final    5,000 COLONIES/mL ESCHERICHIA COLI Confirmed Extended Spectrum Beta-Lactamase Producer (ESBL).  In bloodstream infections from ESBL organisms, carbapenems are preferred over piperacillin/tazobactam. They are shown to have a lower risk of mortality.    Report Status 05/04/2019 FINAL  Final   Organism ID, Bacteria ESCHERICHIA COLI (A)  Final      Susceptibility   Escherichia coli - MIC*  AMPICILLIN >=32 RESISTANT Resistant     CEFAZOLIN >=64 RESISTANT Resistant     CEFTRIAXONE >=64 RESISTANT Resistant     CIPROFLOXACIN >=4 RESISTANT Resistant     GENTAMICIN <=1 SENSITIVE Sensitive     IMIPENEM <=0.25 SENSITIVE Sensitive     NITROFURANTOIN <=16 SENSITIVE Sensitive     TRIMETH/SULFA <=20 SENSITIVE Sensitive     AMPICILLIN/SULBACTAM 16 INTERMEDIATE Intermediate     PIP/TAZO <=4 SENSITIVE Sensitive     * 5,000 COLONIES/mL ESCHERICHIA COLI  SARS CORONAVIRUS 2 (TAT 6-24 HRS) Nasopharyngeal Nasopharyngeal Swab     Status: Abnormal   Collection Time: 05/02/19  6:26 PM   Specimen: Nasopharyngeal Swab  Result Value Ref Range Status   SARS Coronavirus 2 POSITIVE (A) NEGATIVE Final    Comment: RESULT CALLED TO, READ BACK BY AND VERIFIED WITH: H.MASHBURN RN 2253 05/02/19 MCCORMICK K (NOTE) SARS-CoV-2 target nucleic acids are DETECTED. The SARS-CoV-2 RNA is generally detectable in upper and lower respiratory specimens during the acute phase of infection. Positive results are indicative of the presence of SARS-CoV-2 RNA. Clinical correlation with patient history and other diagnostic information is  necessary to determine patient infection status. Positive results do not rule out bacterial infection or co-infection  with other viruses.  The expected result is Negative. Fact Sheet for Patients: SugarRoll.be Fact Sheet for Healthcare Providers: https://www.woods-mathews.com/ This test is not yet approved or cleared by the [place] FDA and  has been authorized for detection and/or diagnosis of SARS-CoV-2 by FDA under an Emergency Use Authorization (EUA). This EUA will remain  in effect (meaning this test can be used) for  the duration of the COVID-19 declaration under Section 564(b)(1) of the Act, 21 U.S.C. section 360bbb-3(b)(1), unless the authorization is terminated or revoked sooner. Performed at [facility], [place] [address] [place], [place] [address]   MRSA PCR Screening     Status: None   Collection Time: 05/02/19  8:30 PM   Specimen: Nasopharyngeal  Result Value Ref Range Status   MRSA by PCR NEGATIVE NEGATIVE Final    Comment:        The GeneXpert MRSA Assay (FDA approved for NASAL specimens only), is one component of a comprehensive MRSA colonization surveillance program. It is not intended to diagnose MRSA infection nor to guide or monitor treatment for MRSA infections. Performed at [facility], [place] [address] [place], [place] [address]   MRSA PCR Screening     Status: None   Collection Time: 05/05/19 11:06 AM   Specimen: Nasopharyngeal  Result Value Ref Range Status   MRSA by PCR NEGATIVE NEGATIVE Final    Comment:        The GeneXpert MRSA Assay (FDA approved for NASAL specimens only), is one component of a comprehensive MRSA colonization surveillance program. It is not intended to diagnose MRSA infection nor to guide or monitor treatment for MRSA infections. Performed at [facility], [place] [address], [place], [place] [address]   Culture, blood (Routine X 2) w Reflex to ID Panel     Status: None (Preliminary result)   Collection Time: 05/06/19 11:59 AM   Specimen: BLOOD  Result Value Ref Range Status    Specimen Description BLOOD RIGHT ANTECUBITAL  Final   Special Requests   Final    BOTTLES DRAWN AEROBIC ONLY Blood Culture adequate volume Performed at [facility], [place] [address], [place], [place] [address]    Culture NO GROWTH 4 DAYS  Final  Report Status PENDING  Incomplete  Culture, blood (Routine X 2) w Reflex to ID Panel     Status: None (Preliminary result)   Collection Time: 05/06/19 11:59 AM   Specimen: BLOOD LEFT FOREARM  Result Value Ref Range Status   Specimen Description BLOOD LEFT FOREARM  Final   Special Requests   Final    BOTTLES DRAWN AEROBIC ONLY Blood Culture adequate volume Performed at [facility], [place] [address] [place], [place] [address]    Culture NO GROWTH 4 DAYS  Final   Report Status PENDING  Incomplete     Labs: BNP (last 3 results) No results for input(s): BNP in the last 8760 hours. Basic Metabolic Panel: Recent Labs  Lab 05/06/19 0359 05/07/19 0225 05/08/19 0456 05/09/19 0218 05/11/19 0519  NA 139 139 139 139 137  K 4.6 4.4 3.7 4.4 4.1  CL 104 100 99 103 102  CO2 '25 27 26 30 27  '$ GLUCOSE 122* 131* 172* 126* 80  BUN 26* 27* 30* 31* 20  CREATININE 0.66 0.61 0.67 0.46* 0.59*  CALCIUM 8.5* 8.3* 8.6* 8.2* 8.1*   Liver Function Tests: Recent Labs  Lab 05/09/19 0218  AST 17  ALT 10  ALKPHOS 42  BILITOT 0.5  PROT 5.1*  ALBUMIN 2.1*   No results for input(s): LIPASE, AMYLASE in the last 168 hours. No results for input(s): AMMONIA in the last 168 hours. CBC: Recent Labs  Lab 05/06/19 0359 05/06/19 0359 05/07/19 0225 05/07/19 0225 05/08/19 0456 05/08/19 1928 05/09/19 0218 05/09/19 0218 05/09/19 1017 05/09/19 1822 05/10/19 0210 05/10/19 1036 05/11/19 0519  WBC 16.2*  --  17.1*  --  20.7*  --  15.6*  --   --   --   --   --  13.2*  NEUTROABS 13.9*  --  14.9*  --  17.3*  --  13.1*  --   --   --   --   --   --   HGB 8.3*   < > 8.2*   < > 8.4*   < > 7.9*   < > 9.2* 8.5* 8.2* 8.5* 8.9*  HCT 26.0*   < > 25.9*   < >  27.0*   < > 25.5*   < > 28.9* 26.5* 26.1* 27.2* 28.0*  MCV 95.6  --  93.8  --  95.7  --  97.7  --   --   --   --   --  96.6  PLT 254  --  274  --  369  --  358  --   --   --   --   --  451*   < > = values in this interval not displayed.   Cardiac Enzymes: No results for input(s): CKTOTAL, CKMB, CKMBINDEX, TROPONINI in the last 168 hours. BNP: Invalid input(s): POCBNP CBG: No results for input(s): GLUCAP in the last 168 hours. D-Dimer No results for input(s): DDIMER in the last 72 hours. Hgb A1c No results for input(s): HGBA1C in the last 72 hours. Lipid Profile No results for input(s): CHOL, HDL, LDLCALC, TRIG, CHOLHDL, LDLDIRECT in the last 72 hours. Thyroid function studies No results for input(s): TSH, T4TOTAL, T3FREE, THYROIDAB in the last 72 hours.  Invalid input(s): FREET3 Anemia work up No results for input(s): VITAMINB12, FOLATE, FERRITIN, TIBC, IRON, RETICCTPCT in the last 72 hours. Urinalysis    Component Value Date/Time   COLORURINE AMBER (A) 05/02/2019 1700   APPEARANCEUR CLOUDY (A) 05/02/2019 1700  LABSPEC 1.031 (H) 05/02/2019 1700   PHURINE 5.0 05/02/2019 1700   GLUCOSEU NEGATIVE 05/02/2019 1700   HGBUR NEGATIVE 05/02/2019 1700   BILIRUBINUR NEGATIVE 05/02/2019 1700   KETONESUR 5 (A) 05/02/2019 1700   PROTEINUR 30 (A) 05/02/2019 1700   NITRITE NEGATIVE 05/02/2019 1700   LEUKOCYTESUR NEGATIVE 05/02/2019 1700   Sepsis Labs Invalid input(s): PROCALCITONIN,  WBC,  LACTICIDVEN Microbiology Recent Results (from the past 240 hour(s))  Blood Culture (routine x 2)     Status: Abnormal   Collection Time: 05/02/19  5:06 PM   Specimen: BLOOD  Result Value Ref Range Status   Specimen Description BLOOD BLOOD RIGHT FOREARM  Final   Special Requests   Final    BOTTLES DRAWN AEROBIC AND ANAEROBIC Blood Culture adequate volume   Culture  Setup Time   Final    ANAEROBIC BOTTLE ONLY GRAM POSITIVE COCCI IN CLUSTERS CRITICAL RESULT CALLED TO, READ BACK BY AND VERIFIED  WITH: L SEAY PHARMD 05/05/19 2536 JDW Performed at [facility], [place] [address], [place], [place] [address]    Culture METHICILLIN RESISTANT STAPHYLOCOCCUS AUREUS (A)  Final   Report Status 05/08/2019 FINAL  Final   Organism ID, Bacteria METHICILLIN RESISTANT STAPHYLOCOCCUS AUREUS  Final      Susceptibility   Methicillin resistant staphylococcus aureus - MIC*    CIPROFLOXACIN >=8 RESISTANT Resistant     ERYTHROMYCIN >=8 RESISTANT Resistant     GENTAMICIN <=0.5 SENSITIVE Sensitive     OXACILLIN >=4 RESISTANT Resistant     TETRACYCLINE <=1 SENSITIVE Sensitive     VANCOMYCIN 1 SENSITIVE Sensitive     TRIMETH/SULFA <=10 SENSITIVE Sensitive     CLINDAMYCIN <=0.25 SENSITIVE Sensitive     RIFAMPIN <=0.5 SENSITIVE Sensitive     Inducible Clindamycin NEGATIVE Sensitive     * METHICILLIN RESISTANT STAPHYLOCOCCUS AUREUS  Blood Culture (routine x 2)     Status: None   Collection Time: 05/02/19  5:08 PM   Specimen: BLOOD  Result Value Ref Range Status   Specimen Description BLOOD BLOOD LEFT FOREARM  Final   Special Requests   Final    BOTTLES DRAWN AEROBIC AND ANAEROBIC Blood Culture adequate volume   Culture   Final    NO GROWTH 5 DAYS Performed at [facility], [address], [place], [place] [address]    Report Status 05/07/2019 FINAL  Final  Urine culture     Status: Abnormal   Collection Time: 05/02/19  5:08 PM   Specimen: In/Out Cath Urine  Result Value Ref Range Status   Specimen Description IN/OUT CATH URINE  Final   Special Requests   Final    NONE Performed at [facility], [place] [address] [place], [place] [address]    Culture (A)  Final    5,000 COLONIES/mL ESCHERICHIA COLI Confirmed Extended Spectrum Beta-Lactamase Producer (ESBL).  In bloodstream infections from ESBL organisms, carbapenems are preferred over piperacillin/tazobactam. They are shown to have a lower risk of mortality.    Report Status 05/04/2019 FINAL  Final   Organism ID, Bacteria  ESCHERICHIA COLI (A)  Final      Susceptibility   Escherichia coli - MIC*    AMPICILLIN >=32 RESISTANT Resistant     CEFAZOLIN >=64 RESISTANT Resistant     CEFTRIAXONE >=64 RESISTANT Resistant     CIPROFLOXACIN >=4 RESISTANT Resistant     GENTAMICIN <=1 SENSITIVE Sensitive     IMIPENEM <=0.25 SENSITIVE Sensitive     NITROFURANTOIN <=16 SENSITIVE Sensitive  TRIMETH/SULFA <=20 SENSITIVE Sensitive     AMPICILLIN/SULBACTAM 16 INTERMEDIATE Intermediate     PIP/TAZO <=4 SENSITIVE Sensitive     * 5,000 COLONIES/mL ESCHERICHIA COLI  SARS CORONAVIRUS 2 (TAT 6-24 HRS) Nasopharyngeal Nasopharyngeal Swab     Status: Abnormal   Collection Time: 05/02/19  6:26 PM   Specimen: Nasopharyngeal Swab  Result Value Ref Range Status   SARS Coronavirus 2 POSITIVE (A) NEGATIVE Final    Comment: RESULT CALLED TO, READ BACK BY AND VERIFIED WITH: H.MASHBURN RN 2253 05/02/19 MCCORMICK K (NOTE) SARS-CoV-2 target nucleic acids are DETECTED. The SARS-CoV-2 RNA is generally detectable in upper and lower respiratory specimens during the acute phase of infection. Positive results are indicative of the presence of SARS-CoV-2 RNA. Clinical correlation with patient history and other diagnostic information is  necessary to determine patient infection status. Positive results do not rule out bacterial infection or co-infection with other viruses.  The expected result is Negative. Fact Sheet for Patients: SugarRoll.be Fact Sheet for Healthcare Providers: https://www.woods-mathews.com/ This test is not yet approved or cleared by the [place] FDA and  has been authorized for detection and/or diagnosis of SARS-CoV-2 by FDA under an Emergency Use Authorization (EUA). This EUA will remain  in effect (meaning this test can be used) for  the duration of the COVID-19 declaration under Section 564(b)(1) of the Act, 21 U.S.C. section 360bbb-3(b)(1), unless the authorization is  terminated or revoked sooner. Performed at [facility], [place] [address] [place], [place] [address]   MRSA PCR Screening     Status: None   Collection Time: 05/02/19  8:30 PM   Specimen: Nasopharyngeal  Result Value Ref Range Status   MRSA by PCR NEGATIVE NEGATIVE Final    Comment:        The GeneXpert MRSA Assay (FDA approved for NASAL specimens only), is one component of a comprehensive MRSA colonization surveillance program. It is not intended to diagnose MRSA infection nor to guide or monitor treatment for MRSA infections. Performed at [facility], [place] [address] [place], [place] [address]   MRSA PCR Screening     Status: None   Collection Time: 05/05/19 11:06 AM   Specimen: Nasopharyngeal  Result Value Ref Range Status   MRSA by PCR NEGATIVE NEGATIVE Final    Comment:        The GeneXpert MRSA Assay (FDA approved for NASAL specimens only), is one component of a comprehensive MRSA colonization surveillance program. It is not intended to diagnose MRSA infection nor to guide or monitor treatment for MRSA infections. Performed at [facility], [place] [address], [place], [place] [address]   Culture, blood (Routine X 2) w Reflex to ID Panel     Status: None (Preliminary result)   Collection Time: 05/06/19 11:59 AM   Specimen: BLOOD  Result Value Ref Range Status   Specimen Description BLOOD RIGHT ANTECUBITAL  Final   Special Requests   Final    BOTTLES DRAWN AEROBIC ONLY Blood Culture adequate volume Performed at [facility], [place] [address] [place], [place] [address]    Culture NO GROWTH 4 DAYS  Final   Report Status PENDING  Incomplete  Culture, blood (Routine X 2) w Reflex to ID Panel     Status: None (Preliminary result)   Collection Time: 05/06/19 11:59 AM   Specimen: BLOOD LEFT FOREARM  Result Value Ref Range Status   Specimen Description BLOOD LEFT FOREARM  Final   Special Requests  Final    BOTTLES DRAWN AEROBIC ONLY Blood  Culture adequate volume Performed at [place] [address], [place], [place] [address]    Culture NO GROWTH 4 DAYS  Final   Report Status PENDING  Incomplete     Time coordinating discharge: 36 minutes.   SIGNED:   Hosie Poisson, MD  [facility] 05/11/2019, 9:15 AM

## 2019-05-11 NOTE — Progress Notes (Signed)
PT Cancellation Note  Patient Details Name: Paul Suarez MRN: [id number] DOB: [birth year]   Cancelled Treatment:    Reason Eval/Treat Not Completed: Patient at procedure or test/unavailable pt getting sterile procedure at this time. PT will continue to follow acutely.    Erline Levine, PTA Acute Rehabilitation Services Pager: [phone] Office: [phone]   05/11/2019, 9:25 AM

## 2019-05-11 NOTE — Progress Notes (Signed)
   05/11/19 0033  MEWS Assessment  Is this an acute change? No (pt has been yellow off and on due to HR and RR)   Patient has been yellow mews off and on due to HR and RR at times. This is not an acute change for the patient.

## 2019-05-14 ENCOUNTER — Encounter: Payer: Self-pay | Admitting: Family Medicine

## 2019-05-15 LAB — CULTURE, BLOOD (ROUTINE X 2)
Culture: NO GROWTH
Culture: NO GROWTH
Special Requests: ADEQUATE
Special Requests: ADEQUATE

## 2019-05-16 ENCOUNTER — Telehealth: Payer: Self-pay

## 2019-05-16 NOTE — Telephone Encounter (Signed)
[facility] calling regarding lab results.   05-16-2019 Vancomycin  trough 7.4   Creatine 0.41   Advised office to fax labs to our office and each lab result moving forward.  Labs will be forwarded to [facility] pharmacy.  Patient is scheduled to end IV antibiotic on 05-20-2019 per [facility] [place].    Laurell Josephs, RN

## 2019-05-21 ENCOUNTER — Ambulatory Visit: Payer: Medicare Other | Admitting: Infectious Diseases

## 2019-05-21 ENCOUNTER — Emergency Department: Payer: Medicare Other

## 2019-05-21 ENCOUNTER — Inpatient Hospital Stay
Admission: EM | Admit: 2019-05-21 | Discharge: 2019-05-24 | DRG: 177 | Disposition: A | Discharge status: Skilled Nursing Facility | Payer: Medicare Other | Attending: Internal Medicine | Admitting: Internal Medicine

## 2019-05-21 ENCOUNTER — Other Ambulatory Visit: Payer: Self-pay

## 2019-05-21 ENCOUNTER — Encounter: Payer: Self-pay | Admitting: Emergency Medicine

## 2019-05-21 DIAGNOSIS — Z66 Do not resuscitate: Secondary | ICD-10-CM | POA: Diagnosis present

## 2019-05-21 DIAGNOSIS — M6284 Sarcopenia: Secondary | ICD-10-CM | POA: Diagnosis present

## 2019-05-21 DIAGNOSIS — K449 Diaphragmatic hernia without obstruction or gangrene: Secondary | ICD-10-CM | POA: Diagnosis present

## 2019-05-21 DIAGNOSIS — Z7189 Other specified counseling: Secondary | ICD-10-CM | POA: Diagnosis not present

## 2019-05-21 DIAGNOSIS — R627 Adult failure to thrive: Secondary | ICD-10-CM | POA: Diagnosis present

## 2019-05-21 DIAGNOSIS — E43 Unspecified severe protein-calorie malnutrition: Secondary | ICD-10-CM | POA: Diagnosis present

## 2019-05-21 DIAGNOSIS — D638 Anemia in other chronic diseases classified elsewhere: Secondary | ICD-10-CM | POA: Diagnosis present

## 2019-05-21 DIAGNOSIS — Z515 Encounter for palliative care: Secondary | ICD-10-CM | POA: Diagnosis not present

## 2019-05-21 DIAGNOSIS — J984 Other disorders of lung: Secondary | ICD-10-CM | POA: Diagnosis present

## 2019-05-21 DIAGNOSIS — Z8616 Personal history of COVID-19: Secondary | ICD-10-CM

## 2019-05-21 DIAGNOSIS — J852 Abscess of lung without pneumonia: Secondary | ICD-10-CM | POA: Diagnosis present

## 2019-05-21 DIAGNOSIS — I10 Essential (primary) hypertension: Secondary | ICD-10-CM | POA: Diagnosis not present

## 2019-05-21 DIAGNOSIS — R591 Generalized enlarged lymph nodes: Secondary | ICD-10-CM | POA: Diagnosis present

## 2019-05-21 DIAGNOSIS — G40909 Epilepsy, unspecified, not intractable, without status epilepticus: Secondary | ICD-10-CM | POA: Diagnosis present

## 2019-05-21 DIAGNOSIS — Z993 Dependence on wheelchair: Secondary | ICD-10-CM | POA: Diagnosis not present

## 2019-05-21 DIAGNOSIS — J439 Emphysema, unspecified: Secondary | ICD-10-CM | POA: Diagnosis present

## 2019-05-21 DIAGNOSIS — Z6820 Body mass index (BMI) 20.0-20.9, adult: Secondary | ICD-10-CM

## 2019-05-21 DIAGNOSIS — Z8701 Personal history of pneumonia (recurrent): Secondary | ICD-10-CM

## 2019-05-21 DIAGNOSIS — Z79899 Other long term (current) drug therapy: Secondary | ICD-10-CM

## 2019-05-21 DIAGNOSIS — J690 Pneumonitis due to inhalation of food and vomit: Secondary | ICD-10-CM | POA: Diagnosis not present

## 2019-05-21 DIAGNOSIS — J9611 Chronic respiratory failure with hypoxia: Secondary | ICD-10-CM | POA: Diagnosis present

## 2019-05-21 DIAGNOSIS — K219 Gastro-esophageal reflux disease without esophagitis: Secondary | ICD-10-CM | POA: Diagnosis present

## 2019-05-21 DIAGNOSIS — Z89512 Acquired absence of left leg below knee: Secondary | ICD-10-CM

## 2019-05-21 DIAGNOSIS — Z9981 Dependence on supplemental oxygen: Secondary | ICD-10-CM | POA: Diagnosis not present

## 2019-05-21 DIAGNOSIS — R0600 Dyspnea, unspecified: Secondary | ICD-10-CM

## 2019-05-21 DIAGNOSIS — J851 Abscess of lung with pneumonia: Secondary | ICD-10-CM

## 2019-05-21 DIAGNOSIS — R0602 Shortness of breath: Secondary | ICD-10-CM | POA: Diagnosis present

## 2019-05-21 DIAGNOSIS — J189 Pneumonia, unspecified organism: Secondary | ICD-10-CM

## 2019-05-21 DIAGNOSIS — Z8614 Personal history of Methicillin resistant Staphylococcus aureus infection: Secondary | ICD-10-CM | POA: Diagnosis not present

## 2019-05-21 DIAGNOSIS — Z7902 Long term (current) use of antithrombotics/antiplatelets: Secondary | ICD-10-CM

## 2019-05-21 DIAGNOSIS — Z87891 Personal history of nicotine dependence: Secondary | ICD-10-CM

## 2019-05-21 DIAGNOSIS — I2510 Atherosclerotic heart disease of native coronary artery without angina pectoris: Secondary | ICD-10-CM | POA: Diagnosis present

## 2019-05-21 DIAGNOSIS — F0390 Unspecified dementia without behavioral disturbance: Secondary | ICD-10-CM | POA: Diagnosis present

## 2019-05-21 DIAGNOSIS — J15212 Pneumonia due to Methicillin resistant Staphylococcus aureus: Secondary | ICD-10-CM | POA: Diagnosis present

## 2019-05-21 DIAGNOSIS — J188 Other pneumonia, unspecified organism: Secondary | ICD-10-CM | POA: Diagnosis present

## 2019-05-21 LAB — BASIC METABOLIC PANEL
Anion gap: 2 — ABNORMAL LOW (ref 5–15)
BUN: 17 mg/dL (ref 8–23)
CO2: 29 mmol/L (ref 22–32)
Calcium: 7.5 mg/dL — ABNORMAL LOW (ref 8.9–10.3)
Chloride: 107 mmol/L (ref 98–111)
Creatinine, Ser: 0.44 mg/dL — ABNORMAL LOW (ref 0.61–1.24)
GFR calc Af Amer: >60 mL/min (ref >60)
GFR calc non Af Amer: >60 mL/min (ref >60)
Glucose, Bld: 85 mg/dL (ref 70–99)
Potassium: 3.7 mmol/L (ref 3.5–5.1)
Sodium: 138 mmol/L (ref 135–145)

## 2019-05-21 LAB — CBC WITH DIFFERENTIAL/PLATELET
Abs Immature Granulocytes: 0.03 10*3/uL (ref 0.00–0.07)
Basophils Absolute: 0.1 10*3/uL (ref 0.0–0.1)
Basophils Relative: 1 %
Eosinophils Absolute: 0.1 10*3/uL (ref 0.0–0.5)
Eosinophils Relative: 2 %
HCT: 29.3 % — ABNORMAL LOW (ref 39.0–52.0)
Hemoglobin: 9.2 g/dL — ABNORMAL LOW (ref 13.0–17.0)
Immature Granulocytes: 0 %
Lymphocytes Relative: 30 %
Lymphs Abs: 2.3 10*3/uL (ref 0.7–4.0)
MCH: 31.1 pg (ref 26.0–34.0)
MCHC: 31.4 g/dL (ref 30.0–36.0)
MCV: 99 fL (ref 80.0–100.0)
Monocytes Absolute: 1.1 10*3/uL — ABNORMAL HIGH (ref 0.1–1.0)
Monocytes Relative: 14 %
Neutro Abs: 4.1 10*3/uL (ref 1.7–7.7)
Neutrophils Relative %: 53 %
Platelets: 231 10*3/uL (ref 150–400)
RBC: 2.96 MIL/uL — ABNORMAL LOW (ref 4.22–5.81)
RDW: 17.7 % — ABNORMAL HIGH (ref 11.5–15.5)
WBC: 7.6 10*3/uL (ref 4.0–10.5)
nRBC: 0 % (ref 0.0–0.2)

## 2019-05-21 LAB — BLOOD GAS, VENOUS
Acid-Base Excess: 5 mmol/L — ABNORMAL HIGH (ref 0.0–2.0)
Bicarbonate: 29.9 mmol/L — ABNORMAL HIGH (ref 20.0–28.0)
O2 Saturation: 68.3 %
Patient temperature: 37
pCO2, Ven: 44 mmHg (ref 44.0–60.0)
pH, Ven: 7.44 — ABNORMAL HIGH (ref 7.250–7.430)
pO2, Ven: 34 mmHg (ref 32.0–45.0)

## 2019-05-21 LAB — VANCOMYCIN, RANDOM: Vancomycin Rm: <4

## 2019-05-21 LAB — PROCALCITONIN: Procalcitonin: <0.1 ng/mL

## 2019-05-21 LAB — BRAIN NATRIURETIC PEPTIDE: B Natriuretic Peptide: 77 pg/mL (ref 0.0–100.0)

## 2019-05-21 MED ORDER — DOCUSATE SODIUM 100 MG PO CAPS
100.0000 mg | ORAL_CAPSULE | Freq: Every day | ORAL | Status: DC
  Administered 2019-05-22 – 2019-05-23 (×2): 100 mg via ORAL
  Filled 2019-05-21 (×3): qty 1

## 2019-05-21 MED ORDER — POLYETHYLENE GLYCOL 3350 17 G PO PACK
17.0000 g | PACK | Freq: Every day | ORAL | Status: DC
  Administered 2019-05-23 – 2019-05-24 (×2): 17 g via ORAL
  Filled 2019-05-21 (×2): qty 1

## 2019-05-21 MED ORDER — NUTRITIONAL SUPPLEMENT PO LIQD: 120.0000 mL | Freq: Two times a day (BID) | ORAL | Status: DC

## 2019-05-21 MED ORDER — ZINC SULFATE 220 (50 ZN) MG PO CAPS
220.0000 mg | ORAL_CAPSULE | Freq: Every day | ORAL | Status: DC
  Administered 2019-05-22: 220 mg via ORAL
  Filled 2019-05-21 (×2): qty 1

## 2019-05-21 MED ORDER — ADULT MULTIVITAMIN W/MINERALS CH
1.0000 | ORAL_TABLET | Freq: Every day | ORAL | Status: DC
  Administered 2019-05-22: 1 via ORAL
  Filled 2019-05-21: qty 1

## 2019-05-21 MED ORDER — LORATADINE 10 MG PO TABS
10.0000 mg | ORAL_TABLET | Freq: Every day | ORAL | Status: DC
  Administered 2019-05-22: 10 mg via ORAL
  Filled 2019-05-21: qty 1

## 2019-05-21 MED ORDER — SODIUM CHLORIDE 0.9 % IV SOLN: 2.0000 g | Freq: Three times a day (TID) | INTRAVENOUS | Status: DC

## 2019-05-21 MED ORDER — VANCOMYCIN HCL 1500 MG/300ML IV SOLN
1500.0000 mg | INTRAVENOUS | Status: DC
  Administered 2019-05-22: 22:00:00 1500 mg via INTRAVENOUS
  Filled 2019-05-21 (×2): qty 300

## 2019-05-21 MED ORDER — SODIUM CHLORIDE 0.9 % IV SOLN: 500.0000 mg | INTRAVENOUS | Status: DC

## 2019-05-21 MED ORDER — NUTRITIONAL SUPPLEMENT PO LIQD: Freq: Every day | ORAL | Status: DC

## 2019-05-21 MED ORDER — DIVALPROEX SODIUM ER 500 MG PO TB24
500.0000 mg | ORAL_TABLET | Freq: Every day | ORAL | Status: DC
  Administered 2019-05-21 – 2019-05-23 (×3): 500 mg via ORAL
  Filled 2019-05-21 (×4): qty 1

## 2019-05-21 MED ORDER — SERTRALINE HCL 50 MG PO TABS
25.0000 mg | ORAL_TABLET | Freq: Every day | ORAL | Status: DC
  Administered 2019-05-22 – 2019-05-24 (×3): 25 mg via ORAL
  Filled 2019-05-21 (×3): qty 1

## 2019-05-21 MED ORDER — ENOXAPARIN SODIUM 40 MG/0.4ML ~~LOC~~ SOLN
40.0000 mg | SUBCUTANEOUS | Status: DC
  Administered 2019-05-21 – 2019-05-23 (×3): 40 mg via SUBCUTANEOUS
  Filled 2019-05-21 (×3): qty 0.4

## 2019-05-21 MED ORDER — VANCOMYCIN HCL 10 G IV SOLR
1750.0000 mg | Freq: Once | INTRAVENOUS | Status: AC
  Administered 2019-05-21: 1750 mg via INTRAVENOUS
  Filled 2019-05-21: qty 1750

## 2019-05-21 MED ORDER — SODIUM CHLORIDE 0.9 % IV SOLN
1.0000 g | Freq: Three times a day (TID) | INTRAVENOUS | Status: DC
  Administered 2019-05-21 – 2019-05-22 (×4): 1 g via INTRAVENOUS
  Filled 2019-05-21 (×5): qty 1

## 2019-05-21 MED ORDER — IOHEXOL 300 MG/ML  SOLN
75.0000 mL | Freq: Once | INTRAMUSCULAR | Status: AC | PRN
  Administered 2019-05-21: 75 mL via INTRAVENOUS

## 2019-05-21 MED ORDER — SODIUM CHLORIDE 0.9 % IV SOLN: INTRAVENOUS | Status: DC

## 2019-05-21 MED ORDER — POLYETHYLENE GLYCOL 3350 17 GM/SCOOP PO POWD
17.0000 g | Freq: Every day | ORAL | Status: DC
  Filled 2019-05-21: qty 255

## 2019-05-21 MED ORDER — ASCORBIC ACID 500 MG PO TABS
500.0000 mg | ORAL_TABLET | Freq: Every day | ORAL | Status: DC
  Administered 2019-05-22: 500 mg via ORAL
  Filled 2019-05-21: qty 1

## 2019-05-21 MED ORDER — TIOTROPIUM BROMIDE MONOHYDRATE 18 MCG IN CAPS
18.0000 ug | ORAL_CAPSULE | Freq: Every day | RESPIRATORY_TRACT | Status: DC
  Administered 2019-05-22 – 2019-05-24 (×3): 18 ug via RESPIRATORY_TRACT
  Filled 2019-05-21: qty 5

## 2019-05-21 MED ORDER — METHYLPREDNISOLONE SODIUM SUCC 125 MG IJ SOLR
125.0000 mg | Freq: Once | INTRAMUSCULAR | Status: AC
  Administered 2019-05-21: 08:00:00 125 mg via INTRAVENOUS
  Filled 2019-05-21: qty 2

## 2019-05-21 MED ORDER — MELATONIN 5 MG PO TABS
2.5000 mg | ORAL_TABLET | Freq: Every evening | ORAL | Status: DC | PRN
  Filled 2019-05-21: qty 0.5

## 2019-05-21 MED ORDER — VITAMIN D 25 MCG (1000 UNIT) PO TABS
1000.0000 [IU] | ORAL_TABLET | Freq: Every day | ORAL | Status: DC
  Administered 2019-05-22: 1000 [IU] via ORAL
  Filled 2019-05-21: qty 1

## 2019-05-21 MED ORDER — IPRATROPIUM-ALBUTEROL 0.5-2.5 (3) MG/3ML IN SOLN
3.0000 mL | Freq: Four times a day (QID) | RESPIRATORY_TRACT | Status: DC
  Administered 2019-05-21 – 2019-05-24 (×10): 3 mL via RESPIRATORY_TRACT
  Filled 2019-05-21 (×10): qty 3

## 2019-05-21 MED ORDER — IPRATROPIUM-ALBUTEROL 0.5-2.5 (3) MG/3ML IN SOLN
3.0000 mL | Freq: Once | RESPIRATORY_TRACT | Status: AC
  Administered 2019-05-21: 3 mL via RESPIRATORY_TRACT
  Filled 2019-05-21: qty 3

## 2019-05-21 MED ORDER — GUAIFENESIN 100 MG/5ML PO SOLN
200.0000 mg | Freq: Four times a day (QID) | ORAL | Status: DC | PRN
  Filled 2019-05-21: qty 10

## 2019-05-21 NOTE — Consult Note (Signed)
Infectious Disease     Reason for Consult:Aspiration PNA   Referring Physician: Dr Francine Graven Date of Admission:  05/21/2019   Principal Problem:   Aspiration pneumonia ([place]) Active Problems:   Essential hypertension   Seizure disorder ([place])   Protein-calorie malnutrition, severe   Anemia of chronic disease   HPI: Paul Suarez is a 79 y.o. male with hx emphysema, chronic hypoxic respiratory failure on 2 L of oxygen, seizure disorder, hypertension, depression and coronary artery disease and recent COVID-19 infection. He was hospitalized at [facility] for MRSA PNA and bacteremia and just finished 2 weeks IV abx on 3/7. Admitted from [facility] with increasing SOB. On admit wbc 7, no fevers, sats 93% pm 2L. CT scan shows RLE consolidation and mod pleural effusion with  locules of gas. Has a lung abscess noted as well.  Also large hiatal hernia  He has hx recent ESBL E coli UTI as well (S bactrim) On eval today he seems confused, and not able to provide much history. Denies cough or chest pain.   Past Medical History:  Diagnosis Date  . CAD (coronary artery disease)   . COPD (chronic obstructive pulmonary disease) ([place])   . Depression   . Essential hypertension   . Seizure disorder ([place])   . Staphylococcus aureus bacteremia 05/06/2019   Past Surgical History:  Procedure Laterality Date  . BELOW KNEE LEG AMPUTATION Left    Social History   Tobacco Use  . Smoking status: Former Smoker    Years: 20.00  . Smokeless tobacco: Never Used  Substance Use Topics  . Alcohol use: Not Currently  . Drug use: Never   History reviewed. No pertinent family history.  Allergies: No Known Allergies  Current antibiotics: Antibiotics Given (last 72 hours)    Date/Time Action Medication Dose Rate   05/21/19 1315 New Bag/Given  [Med not available at time due]   meropenem (MERREM) 1 g in sodium chloride 0.9 % 100 mL IVPB 1 g 200 mL/hr      MEDICATIONS: . vitamin C  500 mg Oral Daily  .  cholecalciferol  1,000 Units Oral Daily  . divalproex  500 mg Oral QHS  . docusate sodium  100 mg Oral QHS  . enoxaparin (LOVENOX) injection  40 mg Subcutaneous Q24H  . ipratropium-albuterol  3 mL Nebulization QID  . loratadine  10 mg Oral Daily  . multivitamin with minerals  1 tablet Oral Daily  . polyethylene glycol  17 g Oral Daily  . sertraline  25 mg Oral Daily  . tiotropium  18 mcg Inhalation Daily  . zinc sulfate  220 mg Oral Daily    Review of Systems -unable to obtain OBJECTIVE: Temp:  [98.1 F (36.7 C)] 98.1 F (36.7 C) (03/08 0739) Pulse Rate:  [103-124] 118 (03/08 1530) Resp:  [18-30] 24 (03/08 1530) BP: (100-132)/(61-86) 121/75 (03/08 1530) SpO2:  [91 %-98 %] 95 % (03/08 1530) Weight:  [65.8 kg] 65.8 kg (03/08 0741) Physical Exam  Constitutional: He is frail, confused HENT: anicteri Mouth/Throat: Oropharynx is clear and moist. No oropharyngeal exudate.  Cardiovascular: Normal rate, regular rhythm and normal heart sounds.  Pulmonary/Chest: Bil Rhonchi,  Abdominal: Soft. Bowel sounds are normal. He exhibits no distension. There is no tenderness.  Lymphadenopathy: He has no cervical adenopathy.  Neurological: He is alert and interactive but not able to provide much history Skin: Skin is warm and dry. No rash noted. No erythema.  Psychiatric: He has a normal mood and affect. His behavior is  normal.     LABS: Results for orders placed or performed during the hospital encounter of 05/21/19 (from the past 48 hour(s))  CBC with Differential     Status: Abnormal   Collection Time: 05/21/19  7:36 AM  Result Value Ref Range   WBC 7.6 4.0 - 10.5 K/uL   RBC 2.96 (L) 4.22 - 5.81 MIL/uL   Hemoglobin 9.2 (L) 13.0 - 17.0 g/dL   HCT 29.3 (L) 39.0 - 52.0 %   MCV 99.0 80.0 - 100.0 fL   MCH 31.1 26.0 - 34.0 pg   MCHC 31.4 30.0 - 36.0 g/dL   RDW 17.7 (H) 11.5 - 15.5 %   Platelets 231 150 - 400 K/uL   nRBC 0.0 0.0 - 0.2 %   Neutrophils Relative % 53 %   Neutro Abs 4.1 1.7 -  7.7 K/uL   Lymphocytes Relative 30 %   Lymphs Abs 2.3 0.7 - 4.0 K/uL   Monocytes Relative 14 %   Monocytes Absolute 1.1 (H) 0.1 - 1.0 K/uL   Eosinophils Relative 2 %   Eosinophils Absolute 0.1 0.0 - 0.5 K/uL   Basophils Relative 1 %   Basophils Absolute 0.1 0.0 - 0.1 K/uL   Immature Granulocytes 0 %   Abs Immature Granulocytes 0.03 0.00 - 0.07 K/uL    Comment: Performed at [facility], [address] [place], [place] [address]  Basic metabolic panel     Status: Abnormal   Collection Time: 05/21/19  7:36 AM  Result Value Ref Range   Sodium 138 135 - 145 mmol/L    Comment: LYTES REPEATED DAS   Potassium 3.7 3.5 - 5.1 mmol/L   Chloride 107 98 - 111 mmol/L   CO2 29 22 - 32 mmol/L   Glucose, Bld 85 70 - 99 mg/dL    Comment: Glucose reference range applies only to samples taken after fasting for at least 8 hours.   BUN 17 8 - 23 mg/dL   Creatinine, Ser 0.44 (L) 0.61 - 1.24 mg/dL   Calcium 7.5 (L) 8.9 - 10.3 mg/dL   GFR calc non Af Amer >60 >60 mL/min   GFR calc Af Amer >60 >60 mL/min   Anion gap 2 (L) 5 - 15    Comment: Performed at [facility], [address] [place], [place] [address]  Brain natriuretic peptide     Status: None   Collection Time: 05/21/19  7:36 AM  Result Value Ref Range   B Natriuretic Peptide 77.0 0.0 - 100.0 pg/mL    Comment: Performed at [facility], [address] [place], [place] [address]  Blood gas, venous     Status: Abnormal   Collection Time: 05/21/19  7:36 AM  Result Value Ref Range   pH, Ven 7.44 (H) 7.250 - 7.430   pCO2, Ven 44 44.0 - 60.0 mmHg   pO2, Ven 34.0 32.0 - 45.0 mmHg   Bicarbonate 29.9 (H) 20.0 - 28.0 mmol/L   Acid-Base Excess 5.0 (H) 0.0 - 2.0 mmol/L   O2 Saturation 68.3 %   Patient temperature 37.0    Collection site VEIN    Sample type VEIN     Comment: Performed at [facility], [address] [place],  [address]   No components found for: ESR, C REACTIVE  PROTEIN MICRO: Recent Results (from the past 720 hour(s))  Blood Culture (routine x 2)     Status: Abnormal   Collection Time: 05/02/19  5:06 PM  Specimen: BLOOD  Result Value Ref Range Status   Specimen Description BLOOD BLOOD RIGHT FOREARM  Final   Special Requests   Final    BOTTLES DRAWN AEROBIC AND ANAEROBIC Blood Culture adequate volume   Culture  Setup Time   Final    ANAEROBIC BOTTLE ONLY GRAM POSITIVE COCCI IN CLUSTERS CRITICAL RESULT CALLED TO, READ BACK BY AND VERIFIED WITH: L SEAY PHARMD 05/05/19 6578 JDW Performed at [facility], [place] [address] [place], [place] [address]    Culture METHICILLIN RESISTANT STAPHYLOCOCCUS AUREUS (A)  Final   Report Status 05/08/2019 FINAL  Final   Organism ID, Bacteria METHICILLIN RESISTANT STAPHYLOCOCCUS AUREUS  Final      Susceptibility   Methicillin resistant staphylococcus aureus - MIC*    CIPROFLOXACIN >=8 RESISTANT Resistant     ERYTHROMYCIN >=8 RESISTANT Resistant     GENTAMICIN <=0.5 SENSITIVE Sensitive     OXACILLIN >=4 RESISTANT Resistant     TETRACYCLINE <=1 SENSITIVE Sensitive     VANCOMYCIN 1 SENSITIVE Sensitive     TRIMETH/SULFA <=10 SENSITIVE Sensitive     CLINDAMYCIN <=0.25 SENSITIVE Sensitive     RIFAMPIN <=0.5 SENSITIVE Sensitive     Inducible Clindamycin NEGATIVE Sensitive     * METHICILLIN RESISTANT STAPHYLOCOCCUS AUREUS  Blood Culture (routine x 2)     Status: None   Collection Time: 05/02/19  5:08 PM   Specimen: BLOOD  Result Value Ref Range Status   Specimen Description BLOOD BLOOD LEFT FOREARM  Final   Special Requests   Final    BOTTLES DRAWN AEROBIC AND ANAEROBIC Blood Culture adequate volume   Culture   Final    NO GROWTH 5 DAYS Performed at [facility], [address], [place], [place] [address]    Report Status 05/07/2019 FINAL  Final  Urine culture     Status: Abnormal   Collection Time: 05/02/19  5:08 PM   Specimen: In/Out Cath Urine  Result Value Ref Range Status   Specimen  Description IN/OUT CATH URINE  Final   Special Requests   Final    NONE Performed at [facility], [place] [address], [place], [place] [address]    Culture (A)  Final    5,000 COLONIES/mL ESCHERICHIA COLI Confirmed Extended Spectrum Beta-Lactamase Producer (ESBL).  In bloodstream infections from ESBL organisms, carbapenems are preferred over piperacillin/tazobactam. They are shown to have a lower risk of mortality.    Report Status 05/04/2019 FINAL  Final   Organism ID, Bacteria ESCHERICHIA COLI (A)  Final      Susceptibility   Escherichia coli - MIC*    AMPICILLIN >=32 RESISTANT Resistant     CEFAZOLIN >=64 RESISTANT Resistant     CEFTRIAXONE >=64 RESISTANT Resistant     CIPROFLOXACIN >=4 RESISTANT Resistant     GENTAMICIN <=1 SENSITIVE Sensitive     IMIPENEM <=0.25 SENSITIVE Sensitive     NITROFURANTOIN <=16 SENSITIVE Sensitive     TRIMETH/SULFA <=20 SENSITIVE Sensitive     AMPICILLIN/SULBACTAM 16 INTERMEDIATE Intermediate     PIP/TAZO <=4 SENSITIVE Sensitive     * 5,000 COLONIES/mL ESCHERICHIA COLI  SARS CORONAVIRUS 2 (TAT 6-24 HRS) Nasopharyngeal Nasopharyngeal Swab     Status: Abnormal   Collection Time: 05/02/19  6:26 PM   Specimen: Nasopharyngeal Swab  Result Value Ref Range Status   SARS Coronavirus 2 POSITIVE (A) NEGATIVE Final    Comment: RESULT CALLED TO, READ BACK BY AND VERIFIED WITH: H.MASHBURN RN [id number] 05/02/19 MCCORMICK K (NOTE)  SARS-CoV-2 target nucleic acids are DETECTED. The SARS-CoV-2 RNA is generally detectable in upper and lower respiratory specimens during the acute phase of infection. Positive results are indicative of the presence of SARS-CoV-2 RNA. Clinical correlation with patient history and other diagnostic information is  necessary to determine patient infection status. Positive results do not rule out bacterial infection or co-infection with other viruses.  The expected result is Negative. Fact Sheet for  Patients: SugarRoll.be Fact Sheet for Healthcare Providers: https://www.woods-mathews.com/ This test is not yet approved or cleared by the [place] FDA and  has been authorized for detection and/or diagnosis of SARS-CoV-2 by FDA under an Emergency Use Authorization (EUA). This EUA will remain  in effect (meaning this test can be used) for  the duration of the COVID-19 declaration under Section 564(b)(1) of the Act, 21 U.S.C. section 360bbb-3(b)(1), unless the authorization is terminated or revoked sooner. Performed at [facility], [place] [address] [place], [place] [address]   MRSA PCR Screening     Status: None   Collection Time: 05/02/19  8:30 PM   Specimen: Nasopharyngeal  Result Value Ref Range Status   MRSA by PCR NEGATIVE NEGATIVE Final    Comment:        The GeneXpert MRSA Assay (FDA approved for NASAL specimens only), is one component of a comprehensive MRSA colonization surveillance program. It is not intended to diagnose MRSA infection nor to guide or monitor treatment for MRSA infections. Performed at [facility], [place] [address], [place], [place] [address]   MRSA PCR Screening     Status: None   Collection Time: 05/05/19 11:06 AM   Specimen: Nasopharyngeal  Result Value Ref Range Status   MRSA by PCR NEGATIVE NEGATIVE Final    Comment:        The GeneXpert MRSA Assay (FDA approved for NASAL specimens only), is one component of a comprehensive MRSA colonization surveillance program. It is not intended to diagnose MRSA infection nor to guide or monitor treatment for MRSA infections. Performed at [facility], [place] [address] [place], [place] [address]   Culture, blood (Routine X 2) w Reflex to ID Panel     Status: None   Collection Time: 05/06/19 11:59 AM   Specimen: BLOOD  Result Value Ref Range Status   Specimen Description BLOOD RIGHT ANTECUBITAL  Final   Special Requests   Final     BOTTLES DRAWN AEROBIC ONLY Blood Culture adequate volume Performed at [facility], [place] [address] [place], [place] [address]    Culture NO GROWTH 9 DAYS  Final   Report Status 05/15/2019 FINAL  Final  Culture, blood (Routine X 2) w Reflex to ID Panel     Status: None   Collection Time: 05/06/19 11:59 AM   Specimen: BLOOD LEFT FOREARM  Result Value Ref Range Status   Specimen Description BLOOD LEFT FOREARM  Final   Special Requests   Final    BOTTLES DRAWN AEROBIC ONLY Blood Culture adequate volume Performed at [facility], [place] [address], [place], [place] [address]    Culture NO GROWTH 9 DAYS  Final   Report Status 05/15/2019 FINAL  Final    IMAGING: CT Chest W Contrast  Result Date: 05/21/2019 CLINICAL DATA:  Shortness of breath, COVID positive on 05/02/2019. Difficulty breathing EXAM: CT CHEST WITH CONTRAST TECHNIQUE: Multidetector CT imaging of the chest was performed during intravenous contrast administration. CONTRAST:  75m OMNIPAQUE IOHEXOL 300 MG/ML  SOLN  COMPARISON:  05/01/2019 FINDINGS: Cardiovascular: Right-sided PICC line terminates at the caval to atrial junction. Thoracic aortic caliber is normal with signs of atherosclerotic calcification. Central pulmonary vasculature mildly engorged. Mediastinum/Nodes: Thoracic inlet structures are unremarkable. No axillary lymphadenopathy. No mediastinal adenopathy. Large hiatal hernia. 2 cm right hilar lymph node, when measured in a similar fashion on the prior study was approximately 1.6 cm. Similar appearance of subcarinal nodal enlargement. Moderately large hiatal hernia is unchanged moderate long segment esophageal thickening. Lungs/Pleura: Filling of the bronchus intermedius and right lower lobe bronchus with material. Interval development of new cystic and partially fluid-filled structure in the right lower lobe along the major fissure (image 75, series 2) 3.4 x 4.0 cm. Patchy areas of consolidative change in the right lung  base. Background pulmonary emphysema with some ground-glass and interstitial thickening in the left chest as well. Process more pronounced on the right. Airways to the left chest without gross filling defect or debris. Small to moderate sub pulmonic right-sided pleural fluid collection. Small gas locules in addition to gas in fluid along the fissure are seen medially in the chest as well. Biapical scarring. Upper Abdomen: Signs of hepatic cysts and presumed renal cysts. Upper abdominal contents are incompletely imaged. Musculoskeletal: No signs of acute bone finding or evidence of destructive bone process. Osteopenia. Spinal degenerative changes. Multifocal lower thoracic and upper lumbar compression fractures are unchanged from recent comparison evaluation. IMPRESSION: 1. Filling of the bronchus intermedius and right lower lobe bronchus with material. Patchy areas of consolidative change in the right lung base. Findings concerning for pneumonitis/pneumonia due to aspiration with developing post infectious pneumatocele/abscess. 2. Moderate right-sided pleural effusion with loculated appearance and small locules of gas, additional explanation for the larger cystic area of may represent trapped gas and fluid within the major fissure. Correlate with any recent intervention/thoracentesis. 3. Background of pulmonary emphysema with some ground-glass and interstitial thickening in the left chest as well, to a lesser extent. 4. Large hiatal hernia with diffuse esophageal thickening, potentially related to esophagitis. 5. Worsening of sequela of pneumonitis in the right chest may explain enlarging mediastinal lymph nodes. Follow-up is suggested to ensure resolution. 6. Similar appearance of subcarinal nodal enlargement. 7. Multifocal lower thoracic and upper lumbar compression fractures are unchanged from recent comparison evaluation. Aortic Atherosclerosis (ICD10-I70.0) and Emphysema (ICD10-J43.9). Electronically Signed    By: Zetta Bills M.D.   On: 05/21/2019 11:02   CT Angio Chest PE W and/or Wo Contrast  Result Date: 05/01/2019 CLINICAL DATA:  Hemoptysis. EXAM: CT ANGIOGRAPHY CHEST WITH CONTRAST TECHNIQUE: Multidetector CT imaging of the chest was performed using the standard protocol during bolus administration of intravenous contrast. Multiplanar CT image reconstructions and MIPs were obtained to evaluate the vascular anatomy. CONTRAST:  142m OMNIPAQUE IOHEXOL 350 MG/ML SOLN COMPARISON:  Chest x-ray earlier same day FINDINGS: Cardiovascular: The heart size is normal. No substantial pericardial effusion. Coronary artery calcification is evident. Atherosclerotic calcification is noted in the wall of the thoracic aorta. No large central pulmonary embolus. No evidence for upper lobe pulmonary embolic disease. Assessment of lower lobes is markedly degraded by breathing motion making assessment of lower lobe segmental and subsegmental pulmonary arteries unreliable. Mediastinum/Nodes: 13 mm short axis right hilar node is associated with a 18 mm short axis subcarinal lymph node. No left hilar lymphadenopathy. Moderate to large hiatal hernia. There is no axillary lymphadenopathy. Lungs/Pleura: Centrilobular and paraseptal emphysema evident. Pleural scarring noted anterior right hemithorax at the base potentially from prior empyema or hemothorax. Patchy and  areas of confluent airspace disease noted posterior right upper lobe, posterior right lower lobe, and posterior left lower lobe. Airspace disease has a peripheral predominance. No pleural effusion. Biapical pleuroparenchymal scarring noted. Upper Abdomen: Small hypoattenuating lesions in the liver cannot be definitively characterized but approach water density and are likely cysts. Probable cysts noted posterior interpolar right kidney and upper pole left kidney Musculoskeletal: No worrisome lytic or sclerotic osseous abnormality. Bones are diffusely demineralized. Compression  fractures are noted at T11, T12, L1 and T6. Review of the MIP images confirms the above findings. IMPRESSION: 1. No large central pulmonary embolus or evidence for pulmonary embolus to either upper lobe. Assessment of segmental and subsegmental pulmonary arteries to the lower lobes is unreliable due to respiratory breathing motion throughout image acquisition. 2. Fairly marked emphysema with patchy bilateral airspace disease, right greater than left. Airspace opacity is confluent in some regions of the posterior right lower lobe. Multifocal pneumonia is a primary consideration. Distribution is not central as typically seen in the setting of pulmonary hemorrhage. Close follow-up recommended to ensure resolution. 3. Mediastinal and right hilar lymphadenopathy, potentially reactive. Attention on follow-up recommended to ensure resolution. 4.  Aortic Atherosclerois (ICD10-170.0) 5. Osteopenia with multiple thoracolumbar compression fractures. Electronically Signed   By: Misty Stanley M.D.   On: 05/01/2019 19:26   DG Chest Port 1 View  Result Date: 05/21/2019 CLINICAL DATA:  Shortness of breath.  COVID-19 positive EXAM: PORTABLE CHEST 1 VIEW COMPARISON:  May 09, 2019 and May 01, 2019 FINDINGS: There is persistent airspace opacity in the right mid lung and throughout the left lower lung regions, with slightly increased consolidation in the right mid lung essentially stable findings in the left base. There is bilateral apical pleural thickening, stable. There is a small right pleural effusion, stable. Heart size and pulmonary vascularity are normal. No adenopathy. There is an apparent hiatal hernia. Central catheter tip is in the superior vena cava. No pneumothorax. No bone lesions. IMPRESSION: Airspace opacity bilaterally, with slightly greater consolidation in the right mid lung compared to most recent study and similar infiltrate in the left base region. Stable apical pleural thickening. Stable cardiac  silhouette. Hiatal hernia present. Central catheter tip in superior vena cava. Electronically Signed   By: Lowella Grip III M.D.   On: 05/21/2019 07:55   DG CHEST PORT 1 VIEW  Result Date: 05/09/2019 CLINICAL DATA:  Sepsis, cough. EXAM: PORTABLE CHEST 1 VIEW COMPARISON:  May 06, 2019. FINDINGS: The heart size and mediastinal contours are within normal limits. No pneumothorax or pleural effusion is noted. Stable biapical scarring and pleural thickening is noted. Stable bilateral lung opacities are noted in the right lung base with left basilar region concerning for pneumonia. The visualized skeletal structures are unremarkable. IMPRESSION: Stable bilateral lung opacities are noted concerning for multifocal pneumonia. Electronically Signed   By: Marijo Conception M.D.   On: 05/09/2019 12:55   DG CHEST PORT 1 VIEW  Result Date: 05/06/2019 CLINICAL DATA:  Hypoxia and shortness of breath EXAM: PORTABLE CHEST 1 VIEW COMPARISON:  Chest radiograph dated 05/04/2019 FINDINGS: The heart size and mediastinal contours are within normal limits. Airspace opacities in the right mid lung have increased since prior exam. Right mid and left lower lobe reticular opacities are not significantly changed. There is no pleural effusion or pneumothorax. A hiatal hernia is unchanged. IMPRESSION: Worsening airspace opacities in the right mid lung, which may represent pneumonia. Unchanged right mid and left lower lobe reticular opacities. Electronically Signed  By: Zerita Boers M.D.   On: 05/06/2019 13:30   DG CHEST PORT 1 VIEW  Result Date: 05/04/2019 CLINICAL DATA:  Shortness of breath EXAM: PORTABLE CHEST 1 VIEW COMPARISON:  05/02/2019 FINDINGS: Stable cardiomediastinal contours. Moderate-sized hiatal hernia. Interval worsening of left lower lobe airspace opacity. The right upper lobe airspace opacity has improved compared to the prior study with a small amount of residual consolidation persisting. No large pleural  fluid collection. No pneumothorax. IMPRESSION: 1. Interval worsening of left lower lobe airspace opacity. 2. Improving right upper lobe airspace opacity with a small amount of residual consolidation. Electronically Signed   By: Davina Poke D.O.   On: 05/04/2019 14:18   DG Chest Port 1 View  Result Date: 05/02/2019 CLINICAL DATA:  79 year old male with hypoxia. EXAM: PORTABLE CHEST 1 VIEW COMPARISON:  Chest radiograph dated 05/01/2019 and CT dated 05/01/2019. FINDINGS: There is background of emphysema. There has been overall interval worsening of the airspace opacities involving the right upper lobe and lingula compared to the prior radiograph. No large pleural effusion. No pneumothorax. Stable cardiomediastinal silhouette. Moderate size hiatal hernia. No acute osseous pathology. IMPRESSION: Overall interval worsening of bilateral pulmonary airspace opacities compared to the radiograph of 02/28/2020. Clinical correlation and continued follow-up recommended. Electronically Signed   By: Anner Crete M.D.   On: 05/02/2019 17:35   DG Chest Portable 1 View  Result Date: 05/01/2019 CLINICAL DATA:  Possible hemoptysis, COVID positive EXAM: PORTABLE CHEST 1 VIEW COMPARISON:  None. FINDINGS: There is increased density in the mid right lung and left greater than right lung bases. No pleural effusion or pneumothorax. Heart size is normal. IMPRESSION: Bilateral pulmonary opacities, which may reflect atypical pneumonia. Component of hemorrhage is also possible. Electronically Signed   By: Macy Mis M.D.   On: 05/01/2019 16:52   DG Swallowing Func-Speech Pathology  Result Date: 05/08/2019 Objective Swallowing Evaluation: Type of Study: MBS-Modified Barium Swallow Study  Patient Details Name: Danis Pembleton MRN: [id number] Date of Birth: April 04, 1940 Today's Date: 05/08/2019 Time: SLP Start Time (ACUTE ONLY): 0845 -SLP Stop Time (ACUTE ONLY): 0905 SLP Time Calculation (min) (ACUTE ONLY): 20 min Past Medical  History: Past Medical History: Diagnosis Date . CAD (coronary artery disease)  . COPD (chronic obstructive pulmonary disease) ([place])  . Depression  . Essential hypertension  . Seizure disorder ([place])  . Staphylococcus aureus bacteremia 05/06/2019 Past Surgical History: Past Surgical History: Procedure Laterality Date . BELOW KNEE LEG AMPUTATION Left  HPI: Teige Rountree is a 79 y.o. gentleman with history of CAD and PVD s/p BKA, Severe centrilobular emphysema on home oxygen 2LNC, who lives in a SNF.He was previously diagnosed with COVID 19 pneumonia. Has a history of recurrent resistant ESBL producing organisms. Recent outpatient anitbiotics courses for concern for post-covid bacterial pneumonia. Having epistaxis which was refractory and prompted ED visit. He was started on CAP coverage with ceftriaxone/azithromycin and steroids for COPD exacerbation. Progressively worsening hypoxemia over the last 3 days PTA. Possible acute aspiration event (secretions?) and now on more oxygen. Also found to have blood cultures positive for Staph aureus - sensitivities pending.  No data recorded Assessment / Plan / Recommendation CHL IP CLINICAL IMPRESSIONS 05/08/2019 Clinical Impression Pt demonstrates normal swallow function for age. Strength WNL, no significant residue. There are instances of slightly slower laryngeal elevation in the swallow, but again WNL for age and did not result in any penetration or aspiraiton. Pt able to masticate soft solids with extra time despite missing dentition. Recommend pt continue regular/thin  unrestricted diet with family assist to choose appropriate foods, discussed with dtr. No SLP f/u needed, will sign off.  SLP Visit Diagnosis Dysphagia, unspecified (R13.10) Attention and concentration deficit following -- Frontal lobe and executive function deficit following -- Impact on safety and function Mild aspiration risk   CHL IP TREATMENT RECOMMENDATION 05/08/2019 Treatment Recommendations No treatment  recommended at this time   No flowsheet data found. CHL IP DIET RECOMMENDATION 05/08/2019 SLP Diet Recommendations Regular solids;Thin liquid Liquid Administration via Cup;Straw Medication Administration Whole meds with liquid Compensations Slow rate;Small sips/bites Postural Changes --   No flowsheet data found.  CHL IP FOLLOW UP RECOMMENDATIONS 05/08/2019 Follow up Recommendations None   No flowsheet data found.     CHL IP ORAL PHASE 05/08/2019 Oral Phase WFL Oral - Pudding Teaspoon -- Oral - Pudding Cup -- Oral - Honey Teaspoon -- Oral - Honey Cup -- Oral - Nectar Teaspoon -- Oral - Nectar Cup -- Oral - Nectar Straw -- Oral - Thin Teaspoon -- Oral - Thin Cup -- Oral - Thin Straw -- Oral - Puree -- Oral - Mech Soft -- Oral - Regular -- Oral - Multi-Consistency -- Oral - Pill -- Oral Phase - Comment --  CHL IP PHARYNGEAL PHASE 05/08/2019 Pharyngeal Phase WFL Pharyngeal- Pudding Teaspoon -- Pharyngeal -- Pharyngeal- Pudding Cup -- Pharyngeal -- Pharyngeal- Honey Teaspoon -- Pharyngeal -- Pharyngeal- Honey Cup -- Pharyngeal -- Pharyngeal- Nectar Teaspoon -- Pharyngeal -- Pharyngeal- Nectar Cup -- Pharyngeal -- Pharyngeal- Nectar Straw -- Pharyngeal -- Pharyngeal- Thin Teaspoon -- Pharyngeal -- Pharyngeal- Thin Cup -- Pharyngeal -- Pharyngeal- Thin Straw -- Pharyngeal -- Pharyngeal- Puree -- Pharyngeal -- Pharyngeal- Mechanical Soft -- Pharyngeal -- Pharyngeal- Regular -- Pharyngeal -- Pharyngeal- Multi-consistency -- Pharyngeal -- Pharyngeal- Pill -- Pharyngeal -- Pharyngeal Comment --  No flowsheet data found. Herbie Baltimore, MA CCC-SLP Acute Rehabilitation Services Pager [phone] Office [phone] Lynann Beaver 05/08/2019, 10:04 AM              ECHOCARDIOGRAM LIMITED  Result Date: 05/07/2019    ECHOCARDIOGRAM LIMITED REPORT   Patient Name:   MICHA DOSANJH Date of Exam: 05/07/2019 Medical Rec #:  [id number]  Height:       71.0 in Accession #:    [id number] Weight:       129.9 lb Date of Birth:  12/09/1940   BSA:          1.755 m Patient Age:    22 years   BP:           119/84 mmHg Patient Gender: M          HR:           103 bpm. Exam Location:  Inpatient Procedure: Limited Echo, Limited Color Doppler and Cardiac Doppler Indications:    Bacteremia 790.7 / R78.81  History:        Patient has no prior history of Echocardiogram examinations.                 CAD, COPD; Risk Factors:Hypertension. COVID19 Positive.                 Seizures.  Sonographer:    Jonelle Sidle Dance Referring Phys: [place]  1. Left ventricular ejection fraction, by estimation, is 60 to 65%. The left ventricle has normal function. The left ventricle has no regional wall motion abnormalities. Left ventricular diastolic parameters are consistent with Grade I diastolic dysfunction (impaired relaxation).  2. Right ventricular systolic function  is normal. The right ventricular size is normal.  3. The mitral valve is degenerative. There is moderate calcification of the mitral valve leaflet(s). Moderate mitral subvalvular calcification. Moderate mitral subvalvular thickening/fibrosis. No evidence of mitral valve regurgitation. No evidence of mitral stenosis.  4. The aortic valve was not well visualized. Aortic valve regurgitation is not visualized. No aortic stenosis is present.  5. The inferior vena cava is normal in size with greater than 50% respiratory variability, suggesting right atrial pressure of 3 mmHg.  6. Poorly visualized MV ad AV. Consider TEE if clinically indicated to rule out vegetation. FINDINGS  Left Ventricle: Left ventricular ejection fraction, by estimation, is 60 to 65%. The left ventricle has normal function. The left ventricle has no regional wall motion abnormalities. The left ventricular internal cavity size was normal in size. There is  no left ventricular hypertrophy. Indeterminate filling pressures. Right Ventricle: The right ventricular size is normal. No increase in right ventricular wall thickness.  Right ventricular systolic function is normal. Left Atrium: Left atrial size was normal in size. Right Atrium: Right atrial size was normal in size. Pericardium: There is no evidence of pericardial effusion. Mitral Valve: The mitral valve is degenerative in appearance. There is moderate thickening of the mitral valve leaflet(s). There is moderate calcification of the mitral valve leaflet(s). Normal mobility of the mitral valve leaflets. Severe mitral annular  calcification. Moderate mitral subvalvular calcification. Moderate mitral subvalvular thickening/fibrosis. No evidence of mitral valve stenosis. Tricuspid Valve: The tricuspid valve is normal in structure. Tricuspid valve regurgitation is not demonstrated. No evidence of tricuspid stenosis. Aortic Valve: The aortic valve was not well visualized. . There is moderate thickening and moderate calcification of the aortic valve. Aortic valve regurgitation is not visualized. No aortic stenosis is present. Moderate aortic valve annular calcification. There is moderate thickening of the aortic valve. There is moderate calcification of the aortic valve. Pulmonic Valve: The pulmonic valve was normal in structure. Pulmonic valve regurgitation is not visualized. No evidence of pulmonic stenosis. Aorta: The aortic root is normal in size and structure. Venous: The inferior vena cava is normal in size with greater than 50% respiratory variability, suggesting right atrial pressure of 3 mmHg. IAS/Shunts: No atrial level shunt detected by color flow Doppler.  LEFT VENTRICLE PLAX 2D LVIDd:         3.60 cm LVIDs:         3.00 cm LV PW:         0.90 cm LV IVS:        1.00 cm LVOT diam:     2.00 cm LV SV:         80.58 ml LV SV Index:   45.92 LVOT Area:     3.14 cm  RIGHT VENTRICLE RV Basal diam:  2.10 cm LEFT ATRIUM             Index       RIGHT ATRIUM          Index LA diam:        3.60 cm 2.05 cm/m  RA Area:     8.72 cm LA Vol (A2C):   39.5 ml 22.51 ml/m RA Volume:   13.20 ml  7.52 ml/m LA Vol (A4C):   19.5 ml 11.11 ml/m LA Biplane Vol: 27.8 ml 15.84 ml/m  AORTIC VALVE LVOT Vmax:   119.00 cm/s LVOT Vmean:  79.200 cm/s LVOT VTI:    0.256 m  AORTA Ao Root diam: 3.60 cm Ao Asc diam:  3.20 cm MITRAL VALVE MV Area (PHT): 2.34 cm     SHUNTS MV Decel Time: 324 msec     Systemic VTI:  0.26 m MV E velocity: 98.00 cm/s   Systemic Diam: 2.00 cm MV A velocity: 154.00 cm/s MV E/A ratio:  0.64 Fransico Him MD Electronically signed by Fransico Him MD Signature Date/Time: 05/07/2019/12:40:44 PM    Final    [place] EKG SITE RITE  Result Date: 05/09/2019 If Site Rite image not attached, placement could not be confirmed due to current cardiac rhythm.   Assessment:   Brodee Mauritz is a 79 y.o. male with hx emphysema, chronic hypoxic respiratory failure on 2 L of oxygen, seizure disorder, hypertension, depression and coronary artery disease and recent COVID-19 infection. He was hospitalized at [facility] for MRSA PNA and bacteremia and just finished 2 weeks IV abx on 3/7. Admitted from [facility] with increasing SOB. On admit wbc 7, no fevers, sats 93% pm 2L. CT scan shows RLE consolidation and mod pleural effusion with  locules of gas. Has a lung abscess noted as well.  Also large hiatal hernia He has hx recent ESBL E coli UTI as well (S bactrim). Currently he does not appear toxic, has no fevers, leukocytosis. [place] pending.  I did review CT with rads and the effusion is not that large, but could be tapped for dx if needed. There more concerning finding is the lung abscess which would not be amendable to drainage.   For lung abscesses we will usually treat with prolonged abx (IV then oral). It would be best to get sputum cx if possible but he tells me he is not producing sputum.   Recommendations Check sputum cx if possible. Cont vanco and meropenem (given recent ESBL E coli on ucx). Will monitor and try to come up with oral regimen for coverage.  Thank you very much for allowing me to  participate in the care of this patient. Please call with questions.   Cheral Marker. Ola Spurr, MD

## 2019-05-21 NOTE — H&P (Signed)
History and Physical    Paul Suarez SWH:[id number] DOB: 02-25-1941 DOA: 05/21/2019  PCP: Hendricks Limes, MD   Patient coming from: SNF  I have personally briefly reviewed patient's old medical records in [place]  Chief Complaint: Shortness of breath  HPI: Paul Suarez is a 79 y.o. male with medical history significant for emphysema, chronic hypoxic respiratory failure on 2 L of oxygen, seizure disorder, hypertension, depression and coronary artery disease and recent COVID-19 infection.  Patient was discharged from the hospital after treatment for staphylococcal sepsis secondary to pneumonia.  He was discharged back to the skilled nursing facility with IV vancomycin and completed dose of antibiotic on 05/20/19.  During his last hospitalization patient was made a DO NOT RESUSCITATE and medical therapy was recommended if patient did not respond, recommendation was to transition to comfort care  ED Course: Patient was sent from [facility] skilled nursing facility for evaluation of shortness of breath. Patient had a CT scan of the chest which showed aspiration pneumonia with postinfectious abscess/pneumatocele.  Patient will be admitted to the hospital.  ID consult has been requested  Review of Systems: As per HPI otherwise 10 point review of systems negative.    Past Medical History:  Diagnosis Date  . CAD (coronary artery disease)   . COPD (chronic obstructive pulmonary disease) ([place])   . Depression   . Essential hypertension   . Seizure disorder ([place])   . Staphylococcus aureus bacteremia 05/06/2019    Past Surgical History:  Procedure Laterality Date  . BELOW KNEE LEG AMPUTATION Left      reports that he has quit smoking. He quit after 20.00 years of use. He has never used smokeless tobacco. He reports previous alcohol use. He reports that he does not use drugs.  No Known Allergies  History reviewed. No pertinent family history.   Prior to Admission medications     Medication Sig Start Date End Date Taking? Authorizing Provider  albuterol (VENTOLIN HFA) 108 (90 Base) MCG/ACT inhaler Inhale 2 puffs into the lungs every 6 (six) hours as needed for shortness of breath (FOR Shortness of Breath  (S.O.B.)).   Yes [provider]  cholecalciferol (VITAMIN D) 25 MCG (1000 UT) tablet Take 1 tablet (1,000 Units total) by mouth daily. 02/12/19  Yes Medina-Vargas, Monina C, NP  clopidogrel (PLAVIX) 75 MG tablet Take 1 tablet (75 mg total) by mouth daily. 02/12/19  Yes Medina-Vargas, Monina C, NP  divalproex (DEPAKOTE ER) 500 MG 24 hr tablet Take 1 tablet (500 mg total) by mouth at bedtime. 02/12/19  Yes Medina-Vargas, Monina C, NP  docusate sodium (COLACE) 100 MG capsule Take 1 capsule (100 mg total) by mouth at bedtime. 02/12/19  Yes Medina-Vargas, Monina C, NP  guaiFENesin (ROBITUSSIN) 100 MG/5ML liquid Take 200 mg by mouth 4 (four) times daily as needed for cough.   Yes [provider]  [place] (DERMACLOUD) CREA Apply topically. Every shift   Yes [provider]  Ipratropium-Albuterol (COMBIVENT RESPIMAT) 20-100 MCG/ACT AERS respimat Inhale 1 puff into the lungs in the morning, at noon, in the evening, and at bedtime.   Yes [provider]  loratadine (CLARITIN) 10 MG tablet Take 1 tablet (10 mg total) by mouth daily. 02/12/19  Yes Medina-Vargas, Monina C, NP  Multiple Vitamin (MULTIVITAMIN WITH MINERALS) TABS tablet Take 1 tablet by mouth daily. 05/11/19  Yes Hosie Poisson, MD  polyethylene glycol powder (GLYCOLAX/MIRALAX) 17 GM/SCOOP powder Take 17 g by mouth daily. 02/12/19  Yes Medina-Vargas, Monina C, NP  sertraline (ZOLOFT) 50 MG tablet Take 25 mg by mouth daily.  04/27/19  Yes [provider]  tiotropium (SPIRIVA HANDIHALER) 18 MCG inhalation capsule Place 1 capsule (18 mcg total) into inhaler and inhale daily. 02/12/19  Yes Medina-Vargas, Monina C, NP  vancomycin IVPB Inject 1,000 mg into the vein daily for  11 days. Indication:  MRSA bacteremia secondary to pneumonia  Last Day of Therapy:  05/20/19 Labs - Sunday/Monday:  CBC/D, BMP, and vancomycin trough. Labs - Thursday:  BMP and vancomycin trough Labs - Every other week:  ESR and CRP 05/11/19 05/22/19 Yes Hosie Poisson, MD  vitamin C (ASCORBIC ACID) 500 MG tablet Take 1 tablet (500 mg total) by mouth daily. 02/12/19  Yes Medina-Vargas, Monina C, NP  zinc sulfate 220 (50 Zn) MG capsule Take 220 mg by mouth daily.  04/19/19  Yes [provider]  ipratropium-albuterol (DUONEB) 0.5-2.5 (3) MG/3ML SOLN Take 3 mLs by nebulization 4 (four) times daily. For SOB and Wheezing 04/28/19 05/01/19  [provider]  magnesium hydroxide (MILK OF MAGNESIA) 400 MG/5ML suspension If no BM in 3 days, give 30 cc Milk of Magnesium p.o. x 1 dose in 24 hours as needed (Do not use standing constipation orders for residents with renal failure CFR less than 30. Contact MD for orders) (Physician Order)    [provider]  Melatonin 3 MG TABS Take 3 mg by mouth at bedtime as needed (sleep).     [provider]  Nutritional Supplement LIQD Take 120 mLs by mouth 2 (two) times daily. MedPass    [provider]  Nutritional Supplements (NUTRITIONAL SUPPLEMENT PO) Take 1 each by mouth daily. Magic Cup    [provider]  OXYGEN Inhale 2 L into the lungs. O2 at 2L/min via [place] continuously (portable & stationary oncentrators)    [provider]  oxymetazoline (AFRIN) 0.05 % nasal spray Place 2 sprays into both nostrils 2 (two) times daily.    [provider]    Physical Exam: Vitals:   05/21/19 1100 05/21/19 1200 05/21/19 1300 05/21/19 1403  BP: 132/76 118/70 126/82 118/86  Pulse: (!) 111 (!) 117 (!) 121 (!) 122  Resp: (!) 28 18 (!) 30 (!) 24  Temp:      TempSrc:      SpO2: 97% 97% 95% 95%  Weight:      Height:         Vitals:   05/21/19 1100 05/21/19 1200 05/21/19 1300 05/21/19 1403  BP: 132/76 118/70 126/82  118/86  Pulse: (!) 111 (!) 117 (!) 121 (!) 122  Resp: (!) 28 18 (!) 30 (!) 24  Temp:      TempSrc:      SpO2: 97% 97% 95% 95%  Weight:      Height:        Constitutional: NAD, alert and oriented to person and place.Chronically ill appearing Eyes: PERRL, lids and conjunctivae normal ENMT: Mucous membranes are moist.  Neck: normal, supple, no masses, no thyromegaly Respiratory: Decreased air entry right lower lobe, no wheezing, scattered rhonchi. Normal respiratory effort. No accessory muscle use.  Cardiovascular: tachycardic, no murmurs / rubs / gallops. No extremity edema. 2+ pedal pulses. No carotid bruits.  Abdomen: no tenderness, no masses palpated. No hepatosplenomegaly. Bowel sounds positive.  Musculoskeletal: no clubbing / cyanosis. No joint deformity upper and lower extremities.  Skin: no rashes, lesions, ulcers.  Neurologic: No gross focal neurologic deficit. Psychiatric: Normal mood and  affect.   Labs on Admission: I have personally reviewed following labs and imaging studies  CBC: Recent Labs  Lab 05/21/19 0736  WBC 7.6  NEUTROABS 4.1  HGB 9.2*  HCT 29.3*  MCV 99.0  PLT 854   Basic Metabolic Panel: Recent Labs  Lab 05/21/19 0736  NA 138  K 3.7  CL 107  CO2 29  GLUCOSE 85  BUN 17  CREATININE 0.44*  CALCIUM 7.5*   GFR: Estimated Creatinine Clearance: 70.8 mL/min (A) (by C-G formula based on SCr of 0.44 mg/dL (L)). Liver Function Tests: No results for input(s): AST, ALT, ALKPHOS, BILITOT, PROT, ALBUMIN in the last 168 hours. No results for input(s): LIPASE, AMYLASE in the last 168 hours. No results for input(s): AMMONIA in the last 168 hours. Coagulation Profile: No results for input(s): INR, PROTIME in the last 168 hours. Cardiac Enzymes: No results for input(s): CKTOTAL, CKMB, CKMBINDEX, TROPONINI in the last 168 hours. BNP (last 3 results) No results for input(s): PROBNP in the last 8760 hours. HbA1C: No results for input(s): HGBA1C in the last  72 hours. CBG: No results for input(s): GLUCAP in the last 168 hours. Lipid Profile: No results for input(s): CHOL, HDL, LDLCALC, TRIG, CHOLHDL, LDLDIRECT in the last 72 hours. Thyroid Function Tests: No results for input(s): TSH, T4TOTAL, FREET4, T3FREE, THYROIDAB in the last 72 hours. Anemia Panel: No results for input(s): VITAMINB12, FOLATE, FERRITIN, TIBC, IRON, RETICCTPCT in the last 72 hours. Urine analysis:    Component Value Date/Time   COLORURINE AMBER (A) 05/02/2019 1700   APPEARANCEUR CLOUDY (A) 05/02/2019 1700   LABSPEC 1.031 (H) 05/02/2019 1700   PHURINE 5.0 05/02/2019 1700   GLUCOSEU NEGATIVE 05/02/2019 1700   HGBUR NEGATIVE 05/02/2019 1700   BILIRUBINUR NEGATIVE 05/02/2019 1700   KETONESUR 5 (A) 05/02/2019 1700   PROTEINUR 30 (A) 05/02/2019 1700   NITRITE NEGATIVE 05/02/2019 1700   LEUKOCYTESUR NEGATIVE 05/02/2019 1700    Radiological Exams on Admission: CT Chest W Contrast  Result Date: 05/21/2019 CLINICAL DATA:  Shortness of breath, COVID positive on 05/02/2019. Difficulty breathing EXAM: CT CHEST WITH CONTRAST TECHNIQUE: Multidetector CT imaging of the chest was performed during intravenous contrast administration. CONTRAST:  72m OMNIPAQUE IOHEXOL 300 MG/ML  SOLN COMPARISON:  05/01/2019 FINDINGS: Cardiovascular: Right-sided PICC line terminates at the caval to atrial junction. Thoracic aortic caliber is normal with signs of atherosclerotic calcification. Central pulmonary vasculature mildly engorged. Mediastinum/Nodes: Thoracic inlet structures are unremarkable. No axillary lymphadenopathy. No mediastinal adenopathy. Large hiatal hernia. 2 cm right hilar lymph node, when measured in a similar fashion on the prior study was approximately 1.6 cm. Similar appearance of subcarinal nodal enlargement. Moderately large hiatal hernia is unchanged moderate long segment esophageal thickening. Lungs/Pleura: Filling of the bronchus intermedius and right lower lobe bronchus with  material. Interval development of new cystic and partially fluid-filled structure in the right lower lobe along the major fissure (image 75, series 2) 3.4 x 4.0 cm. Patchy areas of consolidative change in the right lung base. Background pulmonary emphysema with some ground-glass and interstitial thickening in the left chest as well. Process more pronounced on the right. Airways to the left chest without gross filling defect or debris. Small to moderate sub pulmonic right-sided pleural fluid collection. Small gas locules in addition to gas in fluid along the fissure are seen medially in the chest as well. Biapical scarring. Upper Abdomen: Signs of hepatic cysts and presumed renal cysts. Upper abdominal contents are incompletely imaged. Musculoskeletal: No signs of acute bone  finding or evidence of destructive bone process. Osteopenia. Spinal degenerative changes. Multifocal lower thoracic and upper lumbar compression fractures are unchanged from recent comparison evaluation. IMPRESSION: 1. Filling of the bronchus intermedius and right lower lobe bronchus with material. Patchy areas of consolidative change in the right lung base. Findings concerning for pneumonitis/pneumonia due to aspiration with developing post infectious pneumatocele/abscess. 2. Moderate right-sided pleural effusion with loculated appearance and small locules of gas, additional explanation for the larger cystic area of may represent trapped gas and fluid within the major fissure. Correlate with any recent intervention/thoracentesis. 3. Background of pulmonary emphysema with some ground-glass and interstitial thickening in the left chest as well, to a lesser extent. 4. Large hiatal hernia with diffuse esophageal thickening, potentially related to esophagitis. 5. Worsening of sequela of pneumonitis in the right chest may explain enlarging mediastinal lymph nodes. Follow-up is suggested to ensure resolution. 6. Similar appearance of subcarinal nodal  enlargement. 7. Multifocal lower thoracic and upper lumbar compression fractures are unchanged from recent comparison evaluation. Aortic Atherosclerosis (ICD10-I70.0) and Emphysema (ICD10-J43.9). Electronically Signed   By: Zetta Bills M.D.   On: 05/21/2019 11:02   DG Chest Port 1 View  Result Date: 05/21/2019 CLINICAL DATA:  Shortness of breath.  COVID-19 positive EXAM: PORTABLE CHEST 1 VIEW COMPARISON:  May 09, 2019 and May 01, 2019 FINDINGS: There is persistent airspace opacity in the right mid lung and throughout the left lower lung regions, with slightly increased consolidation in the right mid lung essentially stable findings in the left base. There is bilateral apical pleural thickening, stable. There is a small right pleural effusion, stable. Heart size and pulmonary vascularity are normal. No adenopathy. There is an apparent hiatal hernia. Central catheter tip is in the superior vena cava. No pneumothorax. No bone lesions. IMPRESSION: Airspace opacity bilaterally, with slightly greater consolidation in the right mid lung compared to most recent study and similar infiltrate in the left base region. Stable apical pleural thickening. Stable cardiac silhouette. Hiatal hernia present. Central catheter tip in superior vena cava. Electronically Signed   By: Lowella Grip III M.D.   On: 05/21/2019 07:55    EKG: Independently reviewed.  Sinus tachycardia  Assessment/Plan Principal Problem:   Aspiration pneumonia (HCC) Active Problems:   Essential hypertension   Seizure disorder (HCC)   Protein-calorie malnutrition, severe   Anemia of chronic disease     Aspiration pneumonia Patient sent to the emergency room for evaluation of worsening shortness of breath.  He recently completed antibiotic therapy for staphylococcal sepsis from pneumonia CT scan of the chest showed filling of the bronchus intermedius and right lower lobe bronchus with material.  Patchy areas of consolidative  change in the right lung base.  Findings concerning for pneumonia due to aspiration with developing abscess.  Moderate right-sided pleural effusion with loculated appearance and small locules of gas. Will start patient on IV antibiotic therapy with meropenem Will request ID consult Will request pulmonology consult Speech therapy consult for swallow function evaluation   Seizure Disorder Place patient on seizure precautions Continue Depakote   COPD with chronic respiratory failure Continue oxygen supplementation Continue as needed bronchodilator therapy and Spiriva Continue oxygen supplementation to maintain pulse oximetry greater than 92%   Anemia of chronic disease H and H is stable   Severe protein calorie malnutrition Patient remains NPO for now until seen by speech therapy for swallow function evaluation Will need nutritionist evaluation    Hold Plavix for possible bronchoscopy DVT prophylaxis: Lovenox Code Status: Full  Family Communication: Plan of care was discussed with patient's daughter, Crystal over the phone.  CODE STATUS was discussed and he is a DO NOT RESUSCITATE Disposition Plan: Back to previous home environment Consults called: ID, Pulmonology    Kamden Reber MD [facility]     05/21/2019, 2:39 PM

## 2019-05-21 NOTE — ED Provider Notes (Signed)
[facility] Emergency Department Provider Note       Time seen: ----------------------------------------- 7:36 AM on 05/21/2019 -----------------------------------------   I have reviewed the triage vital signs and the nursing notes.  HISTORY   Chief Complaint Shortness of Breath    HPI Paul Suarez is a 79 y.o. male with a history of coronary disease, COPD, depression, seizure disorder who presents to the ED for respiratory difficulty.  Patient reportedly from [facility], received a breathing treatment prior to arrival which resolved his breathing difficulties.  Patient states he has had a cough.  He did not want to come here, was encouraged by [facility] to come in.  Past Medical History:  Diagnosis Date  . CAD (coronary artery disease)   . COPD (chronic obstructive pulmonary disease) (HCC)   . Depression   . Essential hypertension   . Seizure disorder (HCC)   . Staphylococcus aureus bacteremia 05/06/2019    Patient Active Problem List   Diagnosis Date Noted  . Protein-calorie malnutrition, severe 05/10/2019  . Pressure injury of skin 05/08/2019  . Staphylococcus aureus bacteremia 05/06/2019  . Severe sepsis (HCC) 05/02/2019  . Acute on chronic respiratory failure with hypoxia (HCC) 05/02/2019  . AKI (acute kidney injury) (HCC) 05/02/2019  . Acute blood loss anemia 05/02/2019  . Tachycardia 04/26/2019  . AMS (altered mental status) 02/13/2019  . UTI due to extended-spectrum beta lactamase (ESBL) producing Escherichia coli 02/12/2019  . Multifocal pneumonia 02/12/2019  . CAD (coronary artery disease) 02/12/2019  . Essential hypertension 02/12/2019  . Vitamin D deficiency 02/12/2019  . Seasonal allergies 02/12/2019  . Muscle spasm/History of left BKA 02/12/2019  . Insomnia 02/12/2019  . COPD with acute exacerbation (HCC) 02/12/2019  . Major depression, recurrent (HCC) 02/12/2019  . Hypokalemia 02/12/2019  . Constipation 02/12/2019  .  Seizure disorder (HCC) 02/12/2019    Past Surgical History:  Procedure Laterality Date  . BELOW KNEE LEG AMPUTATION Left     Allergies Patient has no known allergies.  Social History Social History   Tobacco Use  . Smoking status: Former Smoker    Years: 20.00  . Smokeless tobacco: Never Used  Substance Use Topics  . Alcohol use: Not Currently  . Drug use: Never    Review of Systems Constitutional: Negative for fever. Cardiovascular: Negative for chest pain. Respiratory: Positive for shortness of breath and cough Gastrointestinal: Negative for abdominal pain, vomiting and diarrhea. Musculoskeletal: Negative for back pain. Skin: Negative for rash. Neurological: Negative for headaches, focal weakness or numbness.  All systems negative/normal/unremarkable except as stated in the HPI  ____________________________________________   PHYSICAL EXAM:  VITAL SIGNS: ED Triage Vitals  Enc Vitals Group     BP      Pulse      Resp      Temp      Temp src      SpO2      Weight      Height      Head Circumference      Peak Flow      Pain Score      Pain Loc      Pain Edu?      Excl. in GC?     Constitutional: Alert and oriented. Well appearing and in no distress. Eyes: Conjunctivae are normal. Normal extraocular movements. ENT      Head: Normocephalic and atraumatic.      Nose: No congestion/rhinnorhea.      Mouth/Throat: Mucous membranes are moist.  Neck: No stridor. Cardiovascular: Normal rate, regular rhythm. No murmurs, rubs, or gallops. Respiratory: Normal respiratory effort without tachypnea nor retractions.  Scattered rhonchi Gastrointestinal: Soft and nontender. Normal bowel sounds Musculoskeletal: Nontender with normal range of motion in extremities. No lower extremity tenderness nor edema. Neurologic:  Normal speech and language. No gross focal neurologic deficits are appreciated.  Skin:  Skin is warm, dry and intact. No rash noted. Psychiatric:  Mood and affect are normal. Speech and behavior are normal.  ____________________________________________  EKG: Interpreted by me.  Sinus tachycardia the rate of 105 bpm, low voltage, normal axis, normal QT  ____________________________________________  ED COURSE:  As part of my medical decision making, I reviewed the following data within the [place] History obtained from family if available, nursing notes, old chart and ekg, as well as notes from prior ED visits. Patient presented for dyspnea, we will assess with labs and imaging as indicated at this time.   Procedures  Paul Suarez was evaluated in Emergency Department on 05/21/2019 for the symptoms described in the history of present illness. He was evaluated in the context of the global COVID-19 pandemic, which necessitated consideration that the patient might be at risk for infection with the SARS-CoV-2 virus that causes COVID-19. Institutional protocols and algorithms that pertain to the evaluation of patients at risk for COVID-19 are in a state of rapid change based on information released by regulatory bodies including the CDC and federal and state organizations. These policies and algorithms were followed during the patient's care in the ED.  ____________________________________________   LABS (pertinent positives/negatives)  Labs Reviewed  CBC WITH DIFFERENTIAL/PLATELET - Abnormal; Notable for the following components:      Result Value   RBC 2.96 (*)    Hemoglobin 9.2 (*)    HCT 29.3 (*)    RDW 17.7 (*)    Monocytes Absolute 1.1 (*)    All other components within normal limits  BASIC METABOLIC PANEL - Abnormal; Notable for the following components:   Creatinine, Ser 0.44 (*)    Calcium 7.5 (*)    Anion gap 2 (*)    All other components within normal limits  BLOOD GAS, VENOUS - Abnormal; Notable for the following components:   pH, Ven 7.44 (*)    Bicarbonate 29.9 (*)    Acid-Base Excess 5.0 (*)    All  other components within normal limits  BRAIN NATRIURETIC PEPTIDE    RADIOLOGY Images were viewed by me  Chest x-ray/CT chest IMPRESSION:  1. Filling of the bronchus intermedius and right lower lobe bronchus  with material. Patchy areas of consolidative change in the right  lung base. Findings concerning for pneumonitis/pneumonia due to  aspiration with developing post infectious pneumatocele/abscess.  2. Moderate right-sided pleural effusion with loculated appearance  and small locules of gas, additional explanation for the larger  cystic area of may represent trapped gas and fluid within the major  fissure. Correlate with any recent intervention/thoracentesis.  3. Background of pulmonary emphysema with some ground-glass and  interstitial thickening in the left chest as well, to a lesser  extent.  4. Large hiatal hernia with diffuse esophageal thickening,  potentially related to esophagitis.  5. Worsening of sequela of pneumonitis in the right chest may  explain enlarging mediastinal lymph nodes. Follow-up is suggested to  ensure resolution.  6. Similar appearance of subcarinal nodal enlargement.  7. Multifocal lower thoracic and upper lumbar compression fractures  are unchanged from recent comparison evaluation.  Aortic Atherosclerosis (ICD10-I70.0) and Emphysema (ICD10-J43.9).  ____________________________________________   CRITICAL CARE Performed by: Ulice Dash   Total critical care time: 30 minutes  Critical care time was exclusive of separately billable procedures and treating other patients.  Critical care was necessary to treat or prevent imminent or life-threatening deterioration.  Critical care was time spent personally by me on the following activities: development of treatment plan with patient and/or surrogate as well as nursing, discussions with consultants, evaluation of patient's response to treatment, examination of patient, obtaining history from  patient or surrogate, ordering and performing treatments and interventions, ordering and review of laboratory studies, ordering and review of radiographic studies, pulse oximetry and re-evaluation of patient's condition.   DIFFERENTIAL DIAGNOSIS   COPD, CHF, COVID-19, respiratory failure  FINAL ASSESSMENT AND PLAN  Dyspnea, pneumonitis, possible aspiration pneumonia   Plan: The patient had presented for dyspnea and cough. Patient's labs overall unremarkable. Patient's imaging fortunately revealed concerning findings on CT chest likely indicating worsening pneumonitis with pneumonia which could be aspiration pneumonia.  Discussed this with pulmonology who agrees with plan.  We will try additional broad-spectrum antibiotics and admission.   Ulice Dash, MD    Note: This note was generated in part or whole with voice recognition software. Voice recognition is usually quite accurate but there are transcription errors that can and very often do occur. I apologize for any typographical errors that were not detected and corrected.     Emily Filbert, MD 05/21/19 1110

## 2019-05-21 NOTE — ED Triage Notes (Signed)
Pt to ED by [facility] from [facility] after staff state patient was having breathing difficulty. Pt currently has no c/o of SOB or breathing difficulty. Pt is post COVID +, hx of COPD and on 2L O2 chronically.

## 2019-05-21 NOTE — Consult Note (Addendum)
Pharmacy Antibiotic Note  Paul Suarez is a 79 y.o. male admitted on 05/21/2019 with pneumonia. Of note, patient had recent MRSA/E.coli bacteremia on 05/02/2019  Pharmacy has been consulted for Vancomycin dosing. Per ID, continue meropenem and vancomycin given recent ESBL E coli on urine culture. Home medication of Vancomycin 1000 mg every 24 hours and trough on 3/3/201 was 7.4. Patient was scheduled to have Vancomycin IV, for 11 days in the outpatient setting, end on 3/7/202 per chart review.   Plan: 1. Pharmacy will order MRSA PCR and Vancomycin random level STAT.  Update @ 1940: Vancomycin Random < 4 mcg/mL. Scr used (mg/dL): 5.70.  Will order Vancomycin 1750 mg x1 dose, then 1500 mg Q24H. AUC goal 400-550. Expected AUC 539.1, Expected Cssmin 11.5  Height: 5\' 11"  (180.3 cm) Weight: 145 lb (65.8 kg) IBW/kg (Calculated) : 75.3  Temp (24hrs), Avg:98.4 F (36.9 C), Min:98.1 F (36.7 C), Max:98.6 F (37 C)  Recent Labs  Lab 05/21/19 0736  WBC 7.6  CREATININE 0.44*    Estimated Creatinine Clearance: 70.8 mL/min (A) (by C-G formula based on SCr of 0.44 mg/dL (L)).    No Known Allergies  Antimicrobials this admission:   Dose adjustments this admission:   Microbiology results:   Thank you for allowing pharmacy to be a part of this patient's care.  07/21/19 05/21/2019 5:49 PM

## 2019-05-22 DIAGNOSIS — Z7189 Other specified counseling: Secondary | ICD-10-CM

## 2019-05-22 DIAGNOSIS — Z515 Encounter for palliative care: Secondary | ICD-10-CM

## 2019-05-22 DIAGNOSIS — R627 Adult failure to thrive: Secondary | ICD-10-CM

## 2019-05-22 DIAGNOSIS — J690 Pneumonitis due to inhalation of food and vomit: Principal | ICD-10-CM

## 2019-05-22 LAB — CBC
HCT: 26.6 % — ABNORMAL LOW (ref 39.0–52.0)
Hemoglobin: 8.4 g/dL — ABNORMAL LOW (ref 13.0–17.0)
MCH: 31 pg (ref 26.0–34.0)
MCHC: 31.6 g/dL (ref 30.0–36.0)
MCV: 98.2 fL (ref 80.0–100.0)
Platelets: 222 10*3/uL (ref 150–400)
RBC: 2.71 MIL/uL — ABNORMAL LOW (ref 4.22–5.81)
RDW: 17.4 % — ABNORMAL HIGH (ref 11.5–15.5)
WBC: 7.4 10*3/uL (ref 4.0–10.5)
nRBC: 0 % (ref 0.0–0.2)

## 2019-05-22 LAB — BASIC METABOLIC PANEL
Anion gap: 7 (ref 5–15)
BUN: 15 mg/dL (ref 8–23)
CO2: 24 mmol/L (ref 22–32)
Calcium: 7.9 mg/dL — ABNORMAL LOW (ref 8.9–10.3)
Chloride: 108 mmol/L (ref 98–111)
Creatinine, Ser: 0.43 mg/dL — ABNORMAL LOW (ref 0.61–1.24)
GFR calc Af Amer: >60 mL/min (ref >60)
GFR calc non Af Amer: >60 mL/min (ref >60)
Glucose, Bld: 90 mg/dL (ref 70–99)
Potassium: 3.6 mmol/L (ref 3.5–5.1)
Sodium: 139 mmol/L (ref 135–145)

## 2019-05-22 LAB — MRSA PCR SCREENING: MRSA by PCR: NEGATIVE

## 2019-05-22 LAB — HIV ANTIBODY (ROUTINE TESTING W REFLEX): HIV Screen 4th Generation wRfx: NONREACTIVE

## 2019-05-22 LAB — ALBUMIN: Albumin: 2.4 g/dL — ABNORMAL LOW (ref 3.5–5.0)

## 2019-05-22 MED ORDER — PANTOPRAZOLE SODIUM 40 MG PO TBEC
40.0000 mg | DELAYED_RELEASE_TABLET | Freq: Every day | ORAL | Status: DC
  Administered 2019-05-22 – 2019-05-24 (×3): 40 mg via ORAL
  Filled 2019-05-22 (×3): qty 1

## 2019-05-22 MED ORDER — PIPERACILLIN-TAZOBACTAM 3.375 G IVPB
3.3750 g | Freq: Three times a day (TID) | INTRAVENOUS | Status: DC
  Administered 2019-05-23 (×3): 3.375 g via INTRAVENOUS
  Filled 2019-05-22 (×3): qty 50

## 2019-05-22 NOTE — Consult Note (Addendum)
Consultation Note Date: 05/22/2019   Patient Name: Paul Suarez  DOB: 21-Sep-1940  MRN: [id number]  Age / Sex: 79 y.o., male  PCP: Hendricks Limes, MD Referring Physician: Enzo Bi, MD  Reason for Consultation: Establishing goals of care  HPI/Patient Profile: Dejour Vos is a 79 y.o. male with medical history significant for emphysema, chronic hypoxic respiratory failure on 2 L of oxygen, seizure disorder, hypertension, depression and coronary artery disease and recent COVID-19 infection.  Patient was discharged from the hospital after treatment for staphylococcal sepsis secondary to pneumonia.  He was discharged back to the skilled nursing facility with IV vancomycin and completed dose of antibiotic on 05/20/19.    Clinical Assessment and Goals of Care: Patient sitting in bed. He is confused.   Spoke with his daughter. Prior to Christmas, he lived in assisted living. He was able to wheel himself in a wheelchair and transfer himself. He did not require O2.   She discusses in great detail his decline since Christmas following PNA, UTI, COVID and multiple hospitalizations. She states after every hospitalization and admisison to rehab, he declines further. She states he has not been happy in his facilities as she has not been able to visit. She discusses weight loss and decrease in mental clarity over time.   She states the current plan is for him to discharge to a SNF facility. She would like updates as available on his status and blood cultures. She understands the severity of his illness.   Will continue to follow for further [place] conversations and rapport building.     SUMMARY OF RECOMMENDATIONS   Working on Software engineer. Per EMR, plans for D/C to SNF.  Recommend palliative at D/C.  Will continue to follow for further conversations.   Prognosis:   Poor overall      Primary Diagnoses: Present on  Admission: . (Resolved) Multifocal pneumonia . Essential hypertension . Protein-calorie malnutrition, severe . Aspiration pneumonia ([place]) . Anemia of chronic disease   I have reviewed the medical record, interviewed the patient and family, and examined the patient. The following aspects are pertinent.  Past Medical History:  Diagnosis Date  . CAD (coronary artery disease)   . COPD (chronic obstructive pulmonary disease) ([place])   . Depression   . Essential hypertension   . Seizure disorder ([place])   . Staphylococcus aureus bacteremia 05/06/2019   Social History   Socioeconomic History  . Marital status: Single    Spouse name: Not on file  . Number of children: Not on file  . Years of education: Not on file  . Highest education level: Not on file  Occupational History  . Not on file  Tobacco Use  . Smoking status: Former Smoker    Years: 20.00  . Smokeless tobacco: Never Used  Substance and Sexual Activity  . Alcohol use: Not Currently  . Drug use: Never  . Sexual activity: Not on file  Other Topics Concern  . Not on file  Social History Narrative  . Not on file  Social Determinants of Health   Financial Resource Strain:   . Difficulty of Paying Living Expenses: Not on file  Food Insecurity:   . Worried About Charity fundraiser in the Last Year: Not on file  . Ran Out of Food in the Last Year: Not on file  Transportation Needs:   . Lack of Transportation (Medical): Not on file  . Lack of Transportation (Non-Medical): Not on file  Physical Activity:   . Days of Exercise per Week: Not on file  . Minutes of Exercise per Session: Not on file  Stress:   . Feeling of Stress : Not on file  Social Connections:   . Frequency of Communication with Friends and Family: Not on file  . Frequency of Social Gatherings with Friends and Family: Not on file  . Attends Religious Services: Not on file  . Active Member of Clubs or Organizations: Not on file  . Attends Theatre manager Meetings: Not on file  . Marital Status: Not on file   History reviewed. No pertinent family history. Scheduled Meds: . vitamin C  500 mg Oral Daily  . cholecalciferol  1,000 Units Oral Daily  . divalproex  500 mg Oral QHS  . docusate sodium  100 mg Oral QHS  . enoxaparin (LOVENOX) injection  40 mg Subcutaneous Q24H  . ipratropium-albuterol  3 mL Nebulization QID  . loratadine  10 mg Oral Daily  . multivitamin with minerals  1 tablet Oral Daily  . pantoprazole  40 mg Oral Daily  . polyethylene glycol  17 g Oral Daily  . sertraline  25 mg Oral Daily  . tiotropium  18 mcg Inhalation Daily  . zinc sulfate  220 mg Oral Daily   Continuous Infusions: . sodium chloride 100 mL/hr at 05/22/19 0722  . meropenem (MERREM) IV 1 g (05/22/19 0559)  . vancomycin     PRN Meds:.guaiFENesin, Melatonin Medications Prior to Admission:  Prior to Admission medications   Medication Sig Start Date End Date Taking? Authorizing Provider  albuterol (VENTOLIN HFA) 108 (90 Base) MCG/ACT inhaler Inhale 2 puffs into the lungs every 6 (six) hours as needed for shortness of breath (FOR Shortness of Breath  (S.O.B.)).   Yes [provider]  cholecalciferol (VITAMIN D) 25 MCG (1000 UT) tablet Take 1 tablet (1,000 Units total) by mouth daily. 02/12/19  Yes Medina-Vargas, Monina C, NP  clopidogrel (PLAVIX) 75 MG tablet Take 1 tablet (75 mg total) by mouth daily. 02/12/19  Yes Medina-Vargas, Monina C, NP  divalproex (DEPAKOTE ER) 500 MG 24 hr tablet Take 1 tablet (500 mg total) by mouth at bedtime. 02/12/19  Yes Medina-Vargas, Monina C, NP  docusate sodium (COLACE) 100 MG capsule Take 1 capsule (100 mg total) by mouth at bedtime. 02/12/19  Yes Medina-Vargas, Monina C, NP  guaiFENesin (ROBITUSSIN) 100 MG/5ML liquid Take 200 mg by mouth 4 (four) times daily as needed for cough.   Yes [provider]  [place] (DERMACLOUD) CREA Apply topically. Every shift   Yes [provider]  Ipratropium-Albuterol (COMBIVENT RESPIMAT) 20-100 MCG/ACT AERS respimat Inhale 1 puff into the lungs in the morning, at noon, in the evening, and at bedtime.   Yes [provider]  loratadine (CLARITIN) 10 MG tablet Take 1 tablet (10 mg total) by mouth daily. 02/12/19  Yes Medina-Vargas, Monina C, NP  Multiple Vitamin (MULTIVITAMIN WITH MINERALS) TABS tablet Take 1 tablet by mouth daily. 05/11/19  Yes Hosie Poisson, MD  polyethylene glycol  powder (GLYCOLAX/MIRALAX) 17 GM/SCOOP powder Take 17 g by mouth daily. 02/12/19  Yes Medina-Vargas, Monina C, NP  sertraline (ZOLOFT) 50 MG tablet Take 25 mg by mouth daily.  04/27/19  Yes [provider]  tiotropium (SPIRIVA HANDIHALER) 18 MCG inhalation capsule Place 1 capsule (18 mcg total) into inhaler and inhale daily. 02/12/19  Yes Medina-Vargas, Monina C, NP  vancomycin IVPB Inject 1,000 mg into the vein daily for 11 days. Indication:  MRSA bacteremia secondary to pneumonia  Last Day of Therapy:  05/20/19 Labs - Sunday/Monday:  CBC/D, BMP, and vancomycin trough. Labs - Thursday:  BMP and vancomycin trough Labs - Every other week:  ESR and CRP 05/11/19 05/22/19 Yes Hosie Poisson, MD  vitamin C (ASCORBIC ACID) 500 MG tablet Take 1 tablet (500 mg total) by mouth daily. 02/12/19  Yes Medina-Vargas, Monina C, NP  zinc sulfate 220 (50 Zn) MG capsule Take 220 mg by mouth daily.  04/19/19  Yes [provider]  ipratropium-albuterol (DUONEB) 0.5-2.5 (3) MG/3ML SOLN Take 3 mLs by nebulization 4 (four) times daily. For SOB and Wheezing 04/28/19 05/01/19  [provider]  magnesium hydroxide (MILK OF MAGNESIA) 400 MG/5ML suspension If no BM in 3 days, give 30 cc Milk of Magnesium p.o. x 1 dose in 24 hours as needed (Do not use standing constipation orders for residents with renal failure CFR less than 30. Contact MD for orders) (Physician Order)    [provider]  Melatonin 3 MG TABS Take 3 mg by mouth at bedtime as  needed (sleep).     [provider]  Nutritional Supplement LIQD Take 120 mLs by mouth 2 (two) times daily. MedPass    [provider]  Nutritional Supplements (NUTRITIONAL SUPPLEMENT PO) Take 1 each by mouth daily. Magic Cup    [provider]  OXYGEN Inhale 2 L into the lungs. O2 at 2L/min via [place] continuously (portable & stationary oncentrators)    [provider]  oxymetazoline (AFRIN) 0.05 % nasal spray Place 2 sprays into both nostrils 2 (two) times daily.    [provider]   No Known Allergies Review of Systems  All other systems reviewed and are negative.   Physical Exam Pulmonary:     Effort: Pulmonary effort is normal.  Skin:    Comments: Bruising to right arm.   Neurological:     Mental Status: He is alert.     Comments: Confused.      Vital Signs: BP (!) 111/56 (BP Location: Left Arm)   Pulse 91   Temp (!) 97.5 F (36.4 C) (Oral)   Resp 16   Ht '5\' 11"'$  (1.803 m)   Wt 65.8 kg   SpO2 96%   BMI 20.22 kg/m  Pain Scale: 0-10   Pain Score: 0-No pain   SpO2: SpO2: 96 % O2 Device:SpO2: 96 % O2 Flow Rate: .O2 Flow Rate (L/min): 4 L/min  IO: Intake/output summary:   Intake/Output Summary (Last 24 hours) at 05/22/2019 1406 Last data filed at 05/22/2019 0437 Gross per 24 hour  Intake 497.19 ml  Output 300 ml  Net 197.19 ml    LBM: Last BM Date: 05/22/19 Baseline Weight: Weight: 65.8 kg Most recent weight: Weight: 65.8 kg     Palliative Assessment/Data:     Time In: 1:30 Time Out: 2:10 Time Total: 40 min Greater than 50%  of this time was spent counseling and coordinating care related to the above assessment and plan.  Signed by: Asencion Gowda, NP  Please contact Palliative Medicine Team phone at [phone] for questions and concerns.  For individual provider: See Shea Evans

## 2019-05-22 NOTE — Progress Notes (Signed)
PROGRESS NOTE    Paul Suarez  OFB:[id number] DOB: 1941-03-03 DOA: 05/21/2019 PCP: Hendricks Limes, MD    Assessment & Plan:   Principal Problem:   Aspiration pneumonia [facility]) Active Problems:   Essential hypertension   Seizure disorder ([place])   Protein-calorie malnutrition, severe   Anemia of chronic disease    Paul Suarez is a 79 y.o. Caucasian male with medical history significant for emphysema, chronic hypoxic respiratory failure on 2 L of oxygen, seizure disorder, hypertension, depression and coronary artery disease and recent COVID-19 infection who [facility] evaluation of shortness of breath.    Patient was discharged from the hospital to SNF on 2/26/21after treatment for staphylococcal sepsis secondary to pneumonia.  He was discharged with IV vancomycin and completed dose of antibiotic on 05/20/19.     # Increased dyspnea 2/2 Aspiration pneumonia # Lung abscess # Reflux and regurgitation 2/2 large hiatal hernia --On admit wbc 7, no fevers, sats 93% on 2L (baseline). CT scan showed RLE consolidation and mod pleural effusion with locules of gas. Has a lung abscess noted as well.Also large hiatal hernia. --ID consulted on admission, and "suspect he has a chronic aspiration and related lung abscess (usually anaerobic bacteremia and oral flora) but given recent MRSA Bacteremia cannot rule out MRSA PNA so have to treat for both." --SLP eval suggested aspiration did not come from swallow defect, so likely has regurgitation and backflow of food/liquid 2/2 large hiatal hernia. --started on vanc and meropenem on admission. PLAN: --continue abx as vanc and zosyn while inpatient, per ID rec. --At discharge, prescribe "3 week course of augmentin and bactrim with follow up imaging in 3 weeks to ensure improvement in the abscess.", per ID rec. 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     DVT prophylaxis: Lovenox SQ Code Status: DNR  Family Communication: updated daughter on the phone Disposition Plan: SNF after pulm eval   Subjective and Interval History:  Pt had no complaints today.  No fever, dyspnea, chest pain, abdominal pain, N/V/D.  No problem with oral intake.    ID saw pt.     Objective: Vitals:   05/22/19 1613 05/22/19 1618 05/22/19 1938 05/23/19 0013  BP:    120/69  Pulse: 99   (!) 101  Resp:    16  Temp:    97.7 F (36.5 C)  TempSrc:    Oral  SpO2:  94% 95% 91%  Weight:      Height:        Intake/Output Summary (Last 24 hours) at 05/23/2019 0253 Last data filed at 05/22/2019 2200 Gross per 24 hour  Intake --  Output 500 ml  Net -500 ml   Filed Weights   05/21/19 0741  Weight: 65.8 kg    Examination:   Constitutional: NAD, alert, oriented x2, able to answer ROS questions, but does not appear to know what's  going on, pleasant, comfortable appearing HEENT: conjunctivae and lids normal, EOMI CV: RRR no M,R,G. Distal pulses +2.  No cyanosis.   RESP: gurgling breath sounds, rhonchi, normal respiratory effort, on 2L GI: +BS, NTND Extremities: No effusions, edema, or tenderness in BLE both UE and LE SKIN: warm, dry and intact Neuro: II - XII grossly intact.  Sensation intact Psych: Normal mood and affect.     Data Reviewed: I have personally  reviewed following labs and imaging studies  CBC: Recent Labs  Lab 05/21/19 0736 05/22/19 0323  WBC 7.6 7.4  NEUTROABS 4.1  --   HGB 9.2* 8.4*  HCT 29.3* 26.6*  MCV 99.0 98.2  PLT 231 222   Basic Metabolic Panel: Recent Labs  Lab 05/21/19 0736 05/22/19 0323  NA 138 139  K 3.7 3.6  CL 107 108  CO2 29 24  GLUCOSE 85 90  BUN 17 15  CREATININE 0.44* 0.43*  CALCIUM 7.5* 7.9*   GFR: Estimated Creatinine Clearance: 70.8 mL/min (A) (by C-G formula based on SCr of 0.43 mg/dL (L)). Liver Function Tests: Recent Labs  Lab 05/22/19 0323  ALBUMIN 2.4*   No results for input(s): LIPASE, AMYLASE in the last 168 hours. No results for input(s): AMMONIA in the last 168 hours. Coagulation Profile: No results for input(s): INR, PROTIME in the last 168 hours. Cardiac Enzymes: No results for input(s): CKTOTAL, CKMB, CKMBINDEX, TROPONINI in the last 168 hours. BNP (last 3 results) No results for input(s): PROBNP in the last 8760 hours. HbA1C: No results for input(s): HGBA1C in the last 72 hours. CBG: No results for input(s): GLUCAP in the last 168 hours. Lipid Profile: No results for input(s): CHOL, HDL, LDLCALC, TRIG, CHOLHDL, LDLDIRECT in the last 72 hours. Thyroid Function Tests: No results for input(s): TSH, T4TOTAL, FREET4, T3FREE, THYROIDAB in the last 72 hours. Anemia Panel: No results for input(s): VITAMINB12, FOLATE, FERRITIN, TIBC, IRON, RETICCTPCT in the last 72 hours. Sepsis Labs: Recent Labs  Lab 05/21/19 1728  PROCALCITON <0.10    Recent Results (from the past 240 hour(s))  Culture, blood (Routine X 2) w Reflex to ID Panel     Status: None (Preliminary result)   Collection Time: 05/21/19  5:27 PM   Specimen: BLOOD  Result Value Ref Range Status   Specimen Description BLOOD LEFT ANTECUBITAL  Final   Special Requests   Final    BOTTLES DRAWN AEROBIC AND ANAEROBIC Blood Culture adequate volume   Culture   Final    NO GROWTH < 24 HOURS Performed at [facility], [address], [place], [place] [address]    Report Status PENDING  Incomplete  Culture, blood (Routine X 2) w Reflex to ID Panel     Status: None (Preliminary result)   Collection Time: 05/21/19  5:32 PM   Specimen: BLOOD  Result Value Ref Range Status   Specimen Description BLOOD BLOOD LEFT HAND  Final   Special Requests   Final    BOTTLES DRAWN AEROBIC AND ANAEROBIC Blood Culture adequate volume   Culture   Final    NO GROWTH < 24 HOURS Performed at [facility], [address] [place], [place] [address]    Report Status PENDING  Incomplete  MRSA PCR Screening     Status: None   Collection Time: 05/22/19  2:42 PM   Specimen: Nasopharyngeal  Result Value Ref Range Status   MRSA by PCR NEGATIVE NEGATIVE Final    Comment:  The GeneXpert MRSA Assay (FDA approved for NASAL specimens only), is one component of a comprehensive MRSA colonization surveillance program. It is not intended to diagnose MRSA infection nor to guide or monitor treatment for MRSA infections. Performed at [facility], [address] [place], [place] [address]       Radiology Studies: CT Chest W Contrast  Result Date: 05/21/2019 CLINICAL DATA:  Shortness of breath, COVID positive on 05/02/2019. Difficulty breathing EXAM: CT CHEST WITH CONTRAST TECHNIQUE: Multidetector CT imaging of the chest was performed during intravenous contrast administration. CONTRAST:  70mL OMNIPAQUE IOHEXOL 300 MG/ML  SOLN COMPARISON:  05/01/2019 FINDINGS: Cardiovascular: Right-sided PICC line terminates at the caval to atrial junction. Thoracic aortic caliber is normal with signs of atherosclerotic calcification. Central pulmonary vasculature mildly engorged. Mediastinum/Nodes: Thoracic inlet structures are unremarkable. No axillary lymphadenopathy. No mediastinal adenopathy. Large hiatal hernia. 2 cm right hilar lymph node, when measured in a similar fashion on the prior study was  approximately 1.6 cm. Similar appearance of subcarinal nodal enlargement. Moderately large hiatal hernia is unchanged moderate long segment esophageal thickening. Lungs/Pleura: Filling of the bronchus intermedius and right lower lobe bronchus with material. Interval development of new cystic and partially fluid-filled structure in the right lower lobe along the major fissure (image 75, series 2) 3.4 x 4.0 cm. Patchy areas of consolidative change in the right lung base. Background pulmonary emphysema with some ground-glass and interstitial thickening in the left chest as well. Process more pronounced on the right. Airways to the left chest without gross filling defect or debris. Small to moderate sub pulmonic right-sided pleural fluid collection. Small gas locules in addition to gas in fluid along the fissure are seen medially in the chest as well. Biapical scarring. Upper Abdomen: Signs of hepatic cysts and presumed renal cysts. Upper abdominal contents are incompletely imaged. Musculoskeletal: No signs of acute bone finding or evidence of destructive bone process. Osteopenia. Spinal degenerative changes. Multifocal lower thoracic and upper lumbar compression fractures are unchanged from recent comparison evaluation. IMPRESSION: 1. Filling of the bronchus intermedius and right lower lobe bronchus with material. Patchy areas of consolidative change in the right lung base. Findings concerning for pneumonitis/pneumonia due to aspiration with developing post infectious pneumatocele/abscess. 2. Moderate right-sided pleural effusion with loculated appearance and small locules of gas, additional explanation for the larger cystic area of may represent trapped gas and fluid within the major fissure. Correlate with any recent intervention/thoracentesis. 3. Background of pulmonary emphysema with some ground-glass and interstitial thickening in the left chest as well, to a lesser extent. 4. Large hiatal hernia with diffuse  esophageal thickening, potentially related to esophagitis. 5. Worsening of sequela of pneumonitis in the right chest may explain enlarging mediastinal lymph nodes. Follow-up is suggested to ensure resolution. 6. Similar appearance of subcarinal nodal enlargement. 7. Multifocal lower thoracic and upper lumbar compression fractures are unchanged from recent comparison evaluation. Aortic Atherosclerosis (ICD10-I70.0) and Emphysema (ICD10-J43.9). Electronically Signed   By: Donzetta Kohut M.D.   On: 05/21/2019 11:02   DG Chest Port 1 View  Result Date: 05/21/2019 CLINICAL DATA:  Shortness of breath.  COVID-19 positive EXAM: PORTABLE CHEST 1 VIEW COMPARISON:  May 09, 2019 and May 01, 2019 FINDINGS: There is persistent airspace opacity in the right mid lung and throughout the left lower lung regions, with slightly increased consolidation in the right mid lung essentially stable findings in the left base. There is bilateral apical pleural thickening, stable. There is a small right pleural effusion, stable. Heart  size and pulmonary vascularity are normal. No adenopathy. There is an apparent hiatal hernia. Central catheter tip is in the superior vena cava. No pneumothorax. No bone lesions. IMPRESSION: Airspace opacity bilaterally, with slightly greater consolidation in the right mid lung compared to most recent study and similar infiltrate in the left base region. Stable apical pleural thickening. Stable cardiac silhouette. Hiatal hernia present. Central catheter tip in superior vena cava. Electronically Signed   By: Bretta Bang III M.D.   On: 05/21/2019 07:55     Scheduled Meds: . vitamin C  500 mg Oral Daily  . cholecalciferol  1,000 Units Oral Daily  . divalproex  500 mg Oral QHS  . docusate sodium  100 mg Oral QHS  . enoxaparin (LOVENOX) injection  40 mg Subcutaneous Q24H  . ipratropium-albuterol  3 mL Nebulization QID  . loratadine  10 mg Oral Daily  . multivitamin with minerals  1 tablet  Oral Daily  . pantoprazole  40 mg Oral Daily  . polyethylene glycol  17 g Oral Daily  . sertraline  25 mg Oral Daily  . tiotropium  18 mcg Inhalation Daily  . zinc sulfate  220 mg Oral Daily   Continuous Infusions: . sodium chloride 100 mL/hr at 05/22/19 2133  . piperacillin-tazobactam (ZOSYN)  IV 3.375 g (05/23/19 0139)  . vancomycin 1,500 mg (05/22/19 2135)     LOS: 2 days     Darlin Priestly, MD [facility] If 7PM-7AM, please contact night-coverage 05/23/2019, 2:53 AM

## 2019-05-22 NOTE — TOC Initial Note (Signed)
Transition of Care [facility]) - Initial/Assessment Note    Patient Details  Name: Paul Suarez MRN: [id number] Date of Birth: 07-31-1940  Transition of [facility]) CM/SW Contact:    Paul Hilt, RN Phone Number: 05/22/2019, 10:16 AM  Clinical Narrative:                 Spoke to Daughter Paul Suarez The patient is alert to self with intermittent confusion The daughter stated that she would like the patient to go to [facility] in [place] and agreed for me to reach out to see if they have a bed to offer.   I notified her that I would follow up ince we have a bed offer  Expected Discharge Plan: [place] Barriers to Discharge: Continued Medical Work up   Patient Goals and CMS Choice Patient states their goals for this hospitalization and ongoing recovery are:: get well CMS Medicare.gov Compare Post Acute Care list provided to:: Patient Represenative (must comment)(Paul Suarez Daughter) Choice offered to / list presented to : Adult Children  Expected Discharge Plan and Services Expected Discharge Plan: [place] Acute Care Choice: [place] Living arrangements for the past 2 months: [place]                                      Prior Living Arrangements/Services Living arrangements for the past 2 months: [place] Lives with:: Facility Resident Patient language and need for interpreter reviewed:: Yes Do you feel safe going back to the place where you live?: Yes      Need for Family Participation in Patient Care: No (Comment) Care giver support system in place?: Yes (comment)   Criminal Activity/Legal Involvement Pertinent to Current Situation/Hospitalization: No - Comment as needed  Activities of Daily Living Home Assistive Devices/Equipment: Shower chair with back, Wheelchair ADL Screening (condition at time of admission) Patient's cognitive ability adequate to safely complete daily  activities?: Yes Is the patient deaf or have difficulty hearing?: No Does the patient have difficulty seeing, even when wearing glasses/contacts?: No Does the patient have difficulty concentrating, remembering, or making decisions?: Yes Patient able to express need for assistance with ADLs?: Yes Does the patient have difficulty dressing or bathing?: Yes Independently performs ADLs?: No Communication: Independent Dressing (OT): Needs assistance Is this a change from baseline?: Pre-admission baseline Grooming: Needs assistance Is this a change from baseline?: Pre-admission baseline Feeding: Independent Bathing: Needs assistance Is this a change from baseline?: Pre-admission baseline Toileting: Needs assistance Is this a change from baseline?: Pre-admission baseline In/Out Bed: Needs assistance Is this a change from baseline?: Pre-admission baseline Walks in Home: Dependent Is this a change from baseline?: Pre-admission baseline Does the patient have difficulty walking or climbing stairs?: Yes Weakness of Legs: None Weakness of Arms/Hands: None  Permission Sought/Granted Permission sought to share information with : Case Manager, Customer service manager, Family Supports Permission granted to share information with : Yes, Verbal Permission Granted  Share Information with NAME: Paul Suarez  Permission granted to share info w AGENCY: SNF/ALF  Permission granted to share info w Relationship: Daughter  Permission granted to share info w Contact Information: [phone]  Emotional Assessment Appearance:: Appears stated age Attitude/Demeanor/Rapport: Unable to Assess Affect (typically observed): Unable to Assess Orientation: : Oriented to Self, Oriented to Place Alcohol / Substance Use: Not Applicable Psych Involvement: No (comment)  Admission diagnosis:  Aspiration  pneumonia (HCC) [J69.0] Pneumonitis [J18.9] Dyspnea, unspecified type [R06.00] Patient Active Problem List    Diagnosis Date Noted  . Aspiration pneumonia (HCC) 05/21/2019  . Anemia of chronic disease 05/21/2019  . Protein-calorie malnutrition, severe 05/10/2019  . Pressure injury of skin 05/08/2019  . Staphylococcus aureus bacteremia 05/06/2019  . Severe sepsis (HCC) 05/02/2019  . Acute on chronic respiratory failure with hypoxia (HCC) 05/02/2019  . AKI (acute kidney injury) (HCC) 05/02/2019  . Acute blood loss anemia 05/02/2019  . Tachycardia 04/26/2019  . AMS (altered mental status) 02/13/2019  . UTI due to extended-spectrum beta lactamase (ESBL) producing Escherichia coli 02/12/2019  . CAD (coronary artery disease) 02/12/2019  . Essential hypertension 02/12/2019  . Vitamin D deficiency 02/12/2019  . Seasonal allergies 02/12/2019  . Muscle spasm/History of left BKA 02/12/2019  . Insomnia 02/12/2019  . COPD with acute exacerbation (HCC) 02/12/2019  . Major depression, recurrent (HCC) 02/12/2019  . Hypokalemia 02/12/2019  . Constipation 02/12/2019  . Seizure disorder (HCC) 02/12/2019   PCP:  Paul Lawless, MD Pharmacy:   [facility] [phone] [place] Otto, [place] - [address] AT [facility] [address] & [facility] [address] [place] [address] Phone: [phone] Fax: [phone]     Social Determinants of Health (SDOH) Interventions    Readmission Risk Interventions No flowsheet data found.

## 2019-05-22 NOTE — [place] FL2 (Signed)
[place] MEDICAID FL2 LEVEL OF CARE SCREENING TOOL     IDENTIFICATION  Patient Name: Paul Suarez Birthdate: August 29, 1940 Sex: male Admission Date (Current Location): 05/21/2019  [facility] and [place] Number:  Chiropodist and Address:  [facility] [place], [address], [place], [place] [address]      Provider Number: [id number]  Attending Physician Name and Address:  Darlin Priestly, MD  Relative Name and Phone Number:  Valentino Hue (daughter) [phone]    Current Level of Care: Hospital Recommended Level of Care: Skilled Nursing Facility Prior Approval Number:    Date Approved/Denied:   PASRR Number: [id number]  Discharge Plan: SNF    Current Diagnoses: Patient Active Problem List   Diagnosis Date Noted  . Aspiration pneumonia (HCC) 05/21/2019  . Anemia of chronic disease 05/21/2019  . Protein-calorie malnutrition, severe 05/10/2019  . Pressure injury of skin 05/08/2019  . Staphylococcus aureus bacteremia 05/06/2019  . Severe sepsis (HCC) 05/02/2019  . Acute on chronic respiratory failure with hypoxia (HCC) 05/02/2019  . AKI (acute kidney injury) (HCC) 05/02/2019  . Acute blood loss anemia 05/02/2019  . Tachycardia 04/26/2019  . AMS (altered mental status) 02/13/2019  . UTI due to extended-spectrum beta lactamase (ESBL) producing Escherichia coli 02/12/2019  . CAD (coronary artery disease) 02/12/2019  . Essential hypertension 02/12/2019  . Vitamin D deficiency 02/12/2019  . Seasonal allergies 02/12/2019  . Muscle spasm/History of left BKA 02/12/2019  . Insomnia 02/12/2019  . COPD with acute exacerbation (HCC) 02/12/2019  . Major depression, recurrent (HCC) 02/12/2019  . Hypokalemia 02/12/2019  . Constipation 02/12/2019  . Seizure disorder (HCC) 02/12/2019    Orientation RESPIRATION BLADDER Height & Weight     Self  O2(2 liters) Incontinent, External catheter Weight: 65.8 kg Height:  5\' 11"  (180.3 cm)  BEHAVIORAL SYMPTOMS/MOOD  NEUROLOGICAL BOWEL NUTRITION STATUS      Continent Diet  AMBULATORY STATUS COMMUNICATION OF NEEDS Skin   Extensive Assist Verbally Other (Comment)(ecchymosis on arms)                       Personal Care Assistance Level of Assistance  Bathing, Feeding, Dressing, Total care Bathing Assistance: Maximum assistance Feeding assistance: Limited assistance Dressing Assistance: Maximum assistance Total Care Assistance: Maximum assistance   Functional Limitations Info  Sight, Hearing, Speech Sight Info: Adequate Hearing Info: Impaired Speech Info: Adequate    SPECIAL CARE FACTORS FREQUENCY  PT (By licensed PT), OT (By licensed OT)     PT Frequency: Min of 5 times per week OT Frequency: min of 5 times per week            Contractures Contractures Info: Not present    Additional Factors Info  Code Status, Allergies Code Status Info: Full Allergies Info: NKDA           Current Medications (05/22/2019):  This is the current hospital active medication list Current Facility-Administered Medications  Medication Dose Route Frequency Provider Last Rate Last Admin  . 0.9 %  sodium chloride infusion   Intravenous Continuous Agbata, Tochukwu, MD 100 mL/hr at 05/22/19 0722 New Bag at 05/22/19 07/22/19  . ascorbic acid (VITAMIN C) tablet 500 mg  500 mg Oral Daily Agbata, Tochukwu, MD      . cholecalciferol (VITAMIN D3) tablet 1,000 Units  1,000 Units Oral Daily Agbata, Tochukwu, MD      . divalproex (DEPAKOTE ER) 24 hr tablet 500 mg  500 mg Oral QHS Agbata, Tochukwu, MD   500  mg at 05/21/19 2309  . docusate sodium (COLACE) capsule 100 mg  100 mg Oral QHS Agbata, Tochukwu, MD      . enoxaparin (LOVENOX) injection 40 mg  40 mg Subcutaneous Q24H Agbata, Tochukwu, MD   40 mg at 05/21/19 2309  . guaiFENesin (ROBITUSSIN) 100 MG/5ML solution 200 mg  200 mg Oral QID PRN Agbata, Tochukwu, MD      . ipratropium-albuterol (DUONEB) 0.5-2.5 (3) MG/3ML nebulizer solution 3 mL  3 mL Nebulization QID  Agbata, Tochukwu, MD   3 mL at 05/22/19 0754  . loratadine (CLARITIN) tablet 10 mg  10 mg Oral Daily Agbata, Tochukwu, MD      . Melatonin TABS 2.5 mg  2.5 mg Oral QHS PRN Agbata, Tochukwu, MD      . meropenem (MERREM) 1 g in sodium chloride 0.9 % 100 mL IVPB  1 g Intravenous Q8H Agbata, Tochukwu, MD 200 mL/hr at 05/22/19 0559 1 g at 05/22/19 0559  . multivitamin with minerals tablet 1 tablet  1 tablet Oral Daily Agbata, Tochukwu, MD   1 tablet at 05/22/19 1020  . polyethylene glycol (MIRALAX / GLYCOLAX) packet 17 g  17 g Oral Daily Agbata, Tochukwu, MD      . sertraline (ZOLOFT) tablet 25 mg  25 mg Oral Daily Agbata, Tochukwu, MD      . tiotropium (SPIRIVA) inhalation capsule ([facility] use ONLY) 18 mcg  18 mcg Inhalation Daily Agbata, Tochukwu, MD   18 mcg at 05/22/19 1019  . vancomycin (VANCOREADY) IVPB 1500 mg/300 mL  1,500 mg Intravenous Q24H Rowland Lathe, RPH      . zinc sulfate capsule 220 mg  220 mg Oral Daily Agbata, Tochukwu, MD         Discharge Medications: Please see discharge summary for a list of discharge medications.  Relevant Imaging Results:  Relevant Lab Results:   Additional Information SSN: [id number]  Su Hilt, RN

## 2019-05-22 NOTE — Consult Note (Addendum)
Reason for Consult: Lung abscess query need for bronchoscopy Referring Physician: Dr. Charlyne Quale Suarez is an 79 y.o. male.  HPI: Patient is a 79 year old former smoker, quite frail and debilitated gentleman who resides in a skilled nursing facility with a history of COPD and chronic hypoxic respiratory failure (2 L/min oxygen) who presented to [facility] yesterday with increasing shortness of breath noted by skilled nursing facility staff.  Evaluation in the emergency room showed that the patient had no increased oxygen requirements over baseline.  Patient had a recent history of MRSA pneumonia and COVID-19 infection and had been admitted at [facility] for the same.  This was in mid February.  The patient cannot provide reliable history as he is oriented only to person and place but not to time or circumstance.  Evaluation in the emergency room led to a CT scan of the chest that showed abscess formation and right lower lobe consolidation and moderate pleural effusion with locules of gas.  CT also showed retained mucoid material in the right mainstem bronchus.  The patient also has a very large hiatal hernia.  Patient had a prior swallow evaluation that showed no aspiration with swallowing.  We are asked to evaluate the patient for potential bronchoscopy.  Patient cannot provide any other history.  He states that he is "fine".    Past Medical History:  Diagnosis Date  . CAD (coronary artery disease)   . COPD (chronic obstructive pulmonary disease) ([place])   . Depression   . Essential hypertension   . Seizure disorder ([place])   . Staphylococcus aureus bacteremia 05/06/2019    Past Surgical History:  Procedure Laterality Date  . BELOW KNEE LEG AMPUTATION Left     History reviewed. No pertinent family history.  Social History:  reports that he has quit smoking. He quit after 20.00 years of use. He has never used smokeless tobacco. He reports previous alcohol use. He reports that he does not  use drugs.  Allergies: No Known Allergies  Medications:  I have reviewed the patient's current medications. Prior to Admission:  Medications Prior to Admission  Medication Sig Dispense Refill Last Dose  . albuterol (VENTOLIN HFA) 108 (90 Base) MCG/ACT inhaler Inhale 2 puffs into the lungs every 6 (six) hours as needed for shortness of breath (FOR Shortness of Breath  (S.O.B.)).   05/21/2019 at 0346  . cholecalciferol (VITAMIN D) 25 MCG (1000 UT) tablet Take 1 tablet (1,000 Units total) by mouth daily. 30 tablet 0 05/20/2019 at 0919  . clopidogrel (PLAVIX) 75 MG tablet Take 1 tablet (75 mg total) by mouth daily. 30 tablet 0 05/20/2019 at 0919  . divalproex (DEPAKOTE ER) 500 MG 24 hr tablet Take 1 tablet (500 mg total) by mouth at bedtime. 30 tablet 0 05/20/2019 at 2132  . docusate sodium (COLACE) 100 MG capsule Take 1 capsule (100 mg total) by mouth at bedtime. 30 capsule 0 05/20/2019 at 2132  . guaiFENesin (ROBITUSSIN) 100 MG/5ML liquid Take 200 mg by mouth 4 (four) times daily as needed for cough.   05/21/2019 at 0346  . Infant Care Products (DERMACLOUD) CREA Apply topically. Every shift   05/21/2019 at [place]  . Ipratropium-Albuterol (COMBIVENT RESPIMAT) 20-100 MCG/ACT AERS respimat Inhale 1 puff into the lungs in the morning, at noon, in the evening, and at bedtime.   05/21/2019 at 0527  . loratadine (CLARITIN) 10 MG tablet Take 1 tablet (10 mg total) by mouth daily. 30 tablet 0 05/20/2019 at 0919  . Multiple  Vitamin (MULTIVITAMIN WITH MINERALS) TABS tablet Take 1 tablet by mouth daily.   05/20/2019 at 0919  . polyethylene glycol powder (GLYCOLAX/MIRALAX) 17 GM/SCOOP powder Take 17 g by mouth daily. 507 g 0 05/20/2019 at 0919  . sertraline (ZOLOFT) 50 MG tablet Take 25 mg by mouth daily.    05/20/2019 at 0919  . tiotropium (SPIRIVA HANDIHALER) 18 MCG inhalation capsule Place 1 capsule (18 mcg total) into inhaler and inhale daily. 30 capsule 0 05/20/2019 at 0919  . vancomycin IVPB Inject 1,000 mg into the vein daily for  11 days. Indication:  MRSA bacteremia secondary to pneumonia  Last Day of Therapy:  05/20/19 Labs - Sunday/Monday:  CBC/D, BMP, and vancomycin trough. Labs - Thursday:  BMP and vancomycin trough Labs - Every other week:  ESR and CRP 11 Units 0 05/20/2019 at 0919  . vitamin C (ASCORBIC ACID) 500 MG tablet Take 1 tablet (500 mg total) by mouth daily. 30 tablet 0 05/20/2019 at 0919  . zinc sulfate 220 (50 Zn) MG capsule Take 220 mg by mouth daily.    05/20/2019 at 0919  . ipratropium-albuterol (DUONEB) 0.5-2.5 (3) MG/3ML SOLN Take 3 mLs by nebulization 4 (four) times daily. For SOB and Wheezing   Not Taking at Unknown time  . magnesium hydroxide (MILK OF MAGNESIA) 400 MG/5ML suspension If no BM in 3 days, give 30 cc Milk of Magnesium p.o. x 1 dose in 24 hours as needed (Do not use standing constipation orders for residents with renal failure CFR less than 30. Contact MD for orders) (Physician Order)     . Melatonin 3 MG TABS Take 3 mg by mouth at bedtime as needed (sleep).      . Nutritional Supplement LIQD Take 120 mLs by mouth 2 (two) times daily. MedPass     . Nutritional Supplements (NUTRITIONAL SUPPLEMENT PO) Take 1 each by mouth daily. Magic Cup     . OXYGEN Inhale 2 L into the lungs. O2 at 2L/min via [place] continuously (portable & stationary oncentrators)     . oxymetazoline (AFRIN) 0.05 % nasal spray Place 2 sprays into both nostrils 2 (two) times daily.   Completed Course    Results for orders placed or performed during the hospital encounter of 05/21/19 (from the past 48 hour(s))  CBC with Differential     Status: Abnormal   Collection Time: 05/21/19  7:36 AM  Result Value Ref Range   WBC 7.6 4.0 - 10.5 K/uL   RBC 2.96 (L) 4.22 - 5.81 MIL/uL   Hemoglobin 9.2 (L) 13.0 - 17.0 g/dL   HCT 29.3 (L) 39.0 - 52.0 %   MCV 99.0 80.0 - 100.0 fL   MCH 31.1 26.0 - 34.0 pg   MCHC 31.4 30.0 - 36.0 g/dL   RDW 17.7 (H) 11.5 - 15.5 %   Platelets 231 150 - 400 K/uL   nRBC 0.0 0.0 - 0.2 %   Neutrophils  Relative % 53 %   Neutro Abs 4.1 1.7 - 7.7 K/uL   Lymphocytes Relative 30 %   Lymphs Abs 2.3 0.7 - 4.0 K/uL   Monocytes Relative 14 %   Monocytes Absolute 1.1 (H) 0.1 - 1.0 K/uL   Eosinophils Relative 2 %   Eosinophils Absolute 0.1 0.0 - 0.5 K/uL   Basophils Relative 1 %   Basophils Absolute 0.1 0.0 - 0.1 K/uL   Immature Granulocytes 0 %   Abs Immature Granulocytes 0.03 0.00 - 0.07 K/uL    Comment: Performed at [facility], [address], [place], [place] [address]  Basic metabolic panel     Status: Abnormal   Collection Time: 05/21/19  7:36 AM  Result Value Ref Range   Sodium 138 135 - 145 mmol/L    Comment: LYTES REPEATED DAS   Potassium 3.7 3.5 - 5.1 mmol/L   Chloride 107 98 - 111 mmol/L   CO2 29 22 - 32 mmol/L   Glucose, Bld 85 70 - 99 mg/dL    Comment: Glucose reference range applies only to samples taken after fasting for at least 8 hours.   BUN 17 8 - 23 mg/dL   Creatinine, Ser 0.44 (L) 0.61 - 1.24 mg/dL   Calcium 7.5 (L) 8.9 - 10.3 mg/dL   GFR calc non Af Amer >60 >60 mL/min   GFR calc Af Amer >60 >60 mL/min   Anion gap 2 (L) 5 - 15    Comment: Performed at [facility], [address] [place], [place] [address]  Brain natriuretic peptide     Status: None   Collection Time: 05/21/19  7:36 AM  Result Value Ref Range   B Natriuretic Peptide 77.0 0.0 - 100.0 pg/mL    Comment: Performed at [facility], [address] [place], [place] [address]  Blood gas, venous     Status: Abnormal   Collection Time: 05/21/19  7:36 AM  Result Value Ref Range   pH, Ven 7.44 (H) 7.250 - 7.430   pCO2, Ven 44 44.0 - 60.0 mmHg   pO2, Ven 34.0 32.0 - 45.0 mmHg   Bicarbonate 29.9 (H) 20.0 - 28.0 mmol/L   Acid-Base Excess 5.0 (H) 0.0 - 2.0 mmol/L   O2 Saturation 68.3 %   Patient temperature 37.0    Collection site VEIN    Sample type VEIN     Comment: Performed at [facility], [address] [place], [place] [address]  Culture, blood (Routine  X 2) w Reflex to ID Panel     Status: None (Preliminary result)   Collection Time: 05/21/19  5:27 PM   Specimen: BLOOD  Result Value Ref Range   Specimen Description BLOOD LEFT ANTECUBITAL    Special Requests      BOTTLES DRAWN AEROBIC AND ANAEROBIC Blood Culture adequate volume   Culture      NO GROWTH < 24 HOURS Performed at [facility], [address] [place],  [address]    Report Status PENDING   Procalcitonin - Baseline     Status: None   Collection Time: 05/21/19  5:28 PM  Result Value Ref Range   Procalcitonin <0.10 ng/mL    Comment:        Interpretation: PCT (Procalcitonin) <= 0.5 ng/mL: Systemic infection (sepsis) is not likely. Local bacterial infection is possible. (NOTE)       Sepsis PCT Algorithm           Lower Respiratory Tract                                      Infection PCT Algorithm    ----------------------------     ----------------------------         PCT < 0.25 ng/mL                PCT < 0.10 ng/mL         Strongly encourage  Strongly discourage   discontinuation of antibiotics    initiation of antibiotics    ----------------------------     -----------------------------       PCT 0.25 - 0.50 ng/mL            PCT 0.10 - 0.25 ng/mL               OR       >80% decrease in PCT            Discourage initiation of                                            antibiotics      Encourage discontinuation           of antibiotics    ----------------------------     -----------------------------         PCT >= 0.50 ng/mL              PCT 0.26 - 0.50 ng/mL               AND        <80% decrease in PCT             Encourage initiation of                                             antibiotics       Encourage continuation           of antibiotics    ----------------------------     -----------------------------        PCT >= 0.50 ng/mL                  PCT > 0.50 ng/mL               AND         increase in PCT                   Strongly encourage                                      initiation of antibiotics    Strongly encourage escalation           of antibiotics                                     -----------------------------                                           PCT <= 0.25 ng/mL                                                 OR                                        >  80% decrease in PCT                                     Discontinue / Do not initiate                                             antibiotics Performed at [facility], [address] [place], [place] [address]   Culture, blood (Routine X 2) w Reflex to ID Panel     Status: None (Preliminary result)   Collection Time: 05/21/19  5:32 PM   Specimen: BLOOD  Result Value Ref Range   Specimen Description BLOOD BLOOD LEFT HAND    Special Requests      BOTTLES DRAWN AEROBIC AND ANAEROBIC Blood Culture adequate volume   Culture      NO GROWTH < 24 HOURS Performed at [facility], [address] [place], [place] [address]    Report Status PENDING   Vancomycin, random     Status: None   Collection Time: 05/21/19  6:12 PM  Result Value Ref Range   Vancomycin Rm <4     Comment: RESULT CONFIRMED BY MANUAL DILUTION AKT        Random Vancomycin therapeutic range is dependent on dosage and time of specimen collection. A peak range is 20.0-40.0 ug/mL A trough range is 5.0-15.0 ug/mL        Performed at [facility], [place], [place] [address]   HIV Antibody (routine testing w rflx)     Status: None   Collection Time: 05/22/19  3:23 AM  Result Value Ref Range   HIV Screen 4th Generation wRfx NON REACTIVE NON REACTIVE    Comment: Performed at [facility] [address], [place], [place] [address]  Basic metabolic panel     Status: Abnormal   Collection Time: 05/22/19  3:23 AM  Result Value Ref Range   Sodium 139 135 - 145 mmol/L   Potassium 3.6 3.5 - 5.1 mmol/L   Chloride 108 98 -  111 mmol/L   CO2 24 22 - 32 mmol/L   Glucose, Bld 90 70 - 99 mg/dL    Comment: Glucose reference range applies only to samples taken after fasting for at least 8 hours.   BUN 15 8 - 23 mg/dL   Creatinine, Ser 0.43 (L) 0.61 - 1.24 mg/dL   Calcium 7.9 (L) 8.9 - 10.3 mg/dL   GFR calc non Af Amer >60 >60 mL/min   GFR calc Af Amer >60 >60 mL/min   Anion gap 7 5 - 15    Comment: Performed at [facility], [address] [place], [place] [address]  CBC     Status: Abnormal   Collection Time: 05/22/19  3:23 AM  Result Value Ref Range   WBC 7.4 4.0 - 10.5 K/uL   RBC 2.71 (L) 4.22 - 5.81 MIL/uL   Hemoglobin 8.4 (L) 13.0 - 17.0 g/dL   HCT 26.6 (L) 39.0 - 52.0 %   MCV 98.2 80.0 - 100.0 fL   MCH 31.0 26.0 - 34.0 pg   MCHC 31.6 30.0 - 36.0 g/dL   RDW 17.4 (H) 11.5 - 15.5 %   Platelets 222 150 - 400 K/uL   nRBC 0.0 0.0 - 0.2 %  Comment: Performed at [facility], [address] [place],  [address]    CT Chest W Contrast  Result Date: 05/21/2019 CLINICAL DATA:  Shortness of breath, COVID positive on 05/02/2019. Difficulty breathing EXAM: CT CHEST WITH CONTRAST TECHNIQUE: Multidetector CT imaging of the chest was performed during intravenous contrast administration. CONTRAST:  61m OMNIPAQUE IOHEXOL 300 MG/ML  SOLN COMPARISON:  05/01/2019 FINDINGS: Cardiovascular: Right-sided PICC line terminates at the caval to atrial junction. Thoracic aortic caliber is normal with signs of atherosclerotic calcification. Central pulmonary vasculature mildly engorged. Mediastinum/Nodes: Thoracic inlet structures are unremarkable. No axillary lymphadenopathy. No mediastinal adenopathy. Large hiatal hernia. 2 cm right hilar lymph node, when measured in a similar fashion on the prior study was approximately 1.6 cm. Similar appearance of subcarinal nodal enlargement. Moderately large hiatal hernia is unchanged moderate long segment esophageal thickening. Lungs/Pleura: Filling of the bronchus  intermedius and right lower lobe bronchus with material. Interval development of new cystic and partially fluid-filled structure in the right lower lobe along the major fissure (image 75, series 2) 3.4 x 4.0 cm. Patchy areas of consolidative change in the right lung base. Background pulmonary emphysema with some ground-glass and interstitial thickening in the left chest as well. Process more pronounced on the right. Airways to the left chest without gross filling defect or debris. Small to moderate sub pulmonic right-sided pleural fluid collection. Small gas locules in addition to gas in fluid along the fissure are seen medially in the chest as well. Biapical scarring. Upper Abdomen: Signs of hepatic cysts and presumed renal cysts. Upper abdominal contents are incompletely imaged. Musculoskeletal: No signs of acute bone finding or evidence of destructive bone process. Osteopenia. Spinal degenerative changes. Multifocal lower thoracic and upper lumbar compression fractures are unchanged from recent comparison evaluation. IMPRESSION: 1. Filling of the bronchus intermedius and right lower lobe bronchus with material. Patchy areas of consolidative change in the right lung base. Findings concerning for pneumonitis/pneumonia due to aspiration with developing post infectious pneumatocele/abscess. 2. Moderate right-sided pleural effusion with loculated appearance and small locules of gas, additional explanation for the larger cystic area of may represent trapped gas and fluid within the major fissure. Correlate with any recent intervention/thoracentesis. 3. Background of pulmonary emphysema with some ground-glass and interstitial thickening in the left chest as well, to a lesser extent. 4. Large hiatal hernia with diffuse esophageal thickening, potentially related to esophagitis. 5. Worsening of sequela of pneumonitis in the right chest may explain enlarging mediastinal lymph nodes. Follow-up is suggested to ensure  resolution. 6. Similar appearance of subcarinal nodal enlargement. 7. Multifocal lower thoracic and upper lumbar compression fractures are unchanged from recent comparison evaluation. Aortic Atherosclerosis (ICD10-I70.0) and Emphysema (ICD10-J43.9). Electronically Signed   By: GZetta BillsM.D.   On: 05/21/2019 11:02   DG Chest Port 1 View  Result Date: 05/21/2019 CLINICAL DATA:  Shortness of breath.  COVID-19 positive EXAM: PORTABLE CHEST 1 VIEW COMPARISON:  May 09, 2019 and May 01, 2019 FINDINGS: There is persistent airspace opacity in the right mid lung and throughout the left lower lung regions, with slightly increased consolidation in the right mid lung essentially stable findings in the left base. There is bilateral apical pleural thickening, stable. There is a small right pleural effusion, stable. Heart size and pulmonary vascularity are normal. No adenopathy. There is an apparent hiatal hernia. Central catheter tip is in the superior vena cava. No pneumothorax. No bone lesions. IMPRESSION: Airspace opacity bilaterally, with slightly greater consolidation in the right mid lung  compared to most recent study and similar infiltrate in the left base region. Stable apical pleural thickening. Stable cardiac silhouette. Hiatal hernia present. Central catheter tip in superior vena cava. Electronically Signed   By: Lowella Grip III M.D.   On: 05/21/2019 07:55    Review of Systems  Unable to perform ROS: Other  Confused  Blood pressure (!) 111/56, pulse 91, temperature (!) 97.5 F (36.4 C), temperature source Oral, resp. rate 16, height '5\' 11"'  (1.803 m), weight 65.8 kg, SpO2 96 %. Physical Exam GENERAL: Cachectic/debilitated elderly gentleman, very frail.  Mild tachypnea but otherwise no distress. HEAD: Normocephalic, atraumatic.  EYES: Pupils equal, round, reactive to light.  No scleral icterus.  MOUTH: Poor dentition. NECK: Supple. No thyromegaly. No nodules. No JVD.  PULMONARY:  Lungs clear to auscultation bilaterally. CARDIOVASCULAR: S1 and S2. Regular rate to mild tachy, regular rhythm.  GASTROINTESTINAL: Nondistended, scaphoid, soft, MUSCULOSKELETAL: Left BKA, no clubbing, no edema on right.  Severe age related sarcopenia. NEUROLOGIC: Grossly nonfocal, he appears confused (oriented to self time or situation) occasionally grasping at things that are not in the room. SKIN: Intact,warm,dry.  Limited exam shows no rashes.  He is pale. PSYCH: Pleasantly confused.      Assessment/Plan:  RIGHT pneumatocele/lung abscess Retained mucous on RIGHT bronchus intermedius LARGE hiatal hernia MRSA bacteremia Suspected patient has silent aspiration related to hiatal hernia Swallow mechanism appears to be intact however silent aspiration from reflux is likely mechanism of aspiration Retained secretions due to poor cough mechanics, debility Recommend pulmonary toilet, albuterol nebulization treatments for mucociliary clearance Continue antibiotics as directed by ID Treatment of lung abscesses with medications Drainage of lung abscesses is discouraged due to the potential formation of bronchopleural fistula Patient too debilitated/frail to tolerate bronchoscopy  COPD without overt exacerbation Pulmonary emphysema Chronic hypoxic respiratory failure Continue supplemental oxygen Bronchodilators via nebulizer 4 times a day Pulmonary toilet No need for steroids as he does not have bronchospasm nor exacerbation  Advanced age and frailty Generalized debility with age-related sarcopenia 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  Thank you for allowing [facility]  to participate in this patient's care.  Paul Don, MD [facility] 05/22/2019, 1:08 PM     *This note was dictated using voice recognition software/Dragon.  Despite best efforts to proofread, errors can occur which can change the meaning.  Any change was purely unintentional.

## 2019-05-22 NOTE — TOC Progression Note (Signed)
Transition of Care [facility]) - Progression Note    Patient Details  Name: Trei Schoch MRN: [id number] Date of Birth: [birth year]  [facility] of [facility] CM/SW Contact  Barrie Dunker, RN Phone Number: 05/22/2019, 10:24 AM  Clinical Narrative:    Othelia Pulling and spoke to [facility] in admissions, I gave her a heads up that the referral was sent thru the [facility] and that the daughter [name] really wants him to come to their facility   Expected Discharge Plan: Skilled Nursing Facility Barriers to Discharge: Continued Medical Work up  Expected Discharge Plan and Services Expected Discharge Plan: Skilled Nursing Facility     Post Acute Care Choice: Skilled Nursing Facility Living arrangements for the past 2 months: Skilled Nursing Facility                                       Social Determinants of Health (SDOH) Interventions    Readmission Risk Interventions No flowsheet data found.

## 2019-05-22 NOTE — Progress Notes (Signed)
[facility] INFECTIOUS DISEASE PROGRESS NOTE Date of Admission:  05/21/2019     ID: Paul Suarez is a 79 y.o. male with lung abscess Principal Problem:   Aspiration pneumonia ([place]) Active Problems:   Essential hypertension   Seizure disorder (HCC)   Protein-calorie malnutrition, severe   Anemia of chronic disease   Subjective: No fevers, wbc nml.   ROS  Unable to obtain  Medications:  Antibiotics Given (last 72 hours)    Date/Time Action Medication Dose Rate   05/21/19 1315 New Bag/Given  [Med not available at time due]   meropenem (MERREM) 1 g in sodium chloride 0.9 % 100 mL IVPB 1 g 200 mL/hr   05/21/19 2315 New Bag/Given   meropenem (MERREM) 1 g in sodium chloride 0.9 % 100 mL IVPB 1 g 200 mL/hr   05/21/19 2358 New Bag/Given   vancomycin (VANCOCIN) 1,750 mg in sodium chloride 0.9 % 500 mL IVPB 1,750 mg 250 mL/hr   05/22/19 0559 New Bag/Given   meropenem (MERREM) 1 g in sodium chloride 0.9 % 100 mL IVPB 1 g 200 mL/hr   05/22/19 1419 New Bag/Given   meropenem (MERREM) 1 g in sodium chloride 0.9 % 100 mL IVPB 1 g 200 mL/hr     . vitamin C  500 mg Oral Daily  . cholecalciferol  1,000 Units Oral Daily  . divalproex  500 mg Oral QHS  . docusate sodium  100 mg Oral QHS  . enoxaparin (LOVENOX) injection  40 mg Subcutaneous Q24H  . ipratropium-albuterol  3 mL Nebulization QID  . loratadine  10 mg Oral Daily  . multivitamin with minerals  1 tablet Oral Daily  . pantoprazole  40 mg Oral Daily  . polyethylene glycol  17 g Oral Daily  . sertraline  25 mg Oral Daily  . tiotropium  18 mcg Inhalation Daily  . zinc sulfate  220 mg Oral Daily    Objective: Vital signs in last 24 hours: Temp:  [97.5 F (36.4 C)-98.6 F (37 C)] 97.5 F (36.4 C) (03/09 0745) Pulse Rate:  [91-118] 91 (03/09 0745) Resp:  [16-24] 16 (03/09 0436) BP: (103-124)/(56-80) 111/56 (03/09 0745) SpO2:  [95 %-100 %] 96 % (03/09 1124)  Constitutional: He is frail, confused HENT: anicteri Mouth/Throat:  Oropharynx is clear and moist. No oropharyngeal exudate.  Cardiovascular: Normal rate, regular rhythm and normal heart sounds.  Pulmonary/Chest: Bil Rhonchi,  Abdominal: Soft. Bowel sounds are normal. He exhibits no distension. There is no tenderness.  Lymphadenopathy: He has no cervical adenopathy.  Neurological: He is alert and interactive but not able to provide much history Skin: Skin is warm and dry. No rash noted. No erythema.  L BKA site well healed Psychiatric: He has a normal mood and affect. His behavior is normal.   Lab Results Recent Labs    05/21/19 0736 05/22/19 0323  WBC 7.6 7.4  HGB 9.2* 8.4*  HCT 29.3* 26.6*  NA 138 139  K 3.7 3.6  CL 107 108  CO2 29 24  BUN 17 15  CREATININE 0.44* 0.43*    Microbiology: Results for orders placed or performed during the hospital encounter of 05/21/19  Culture, blood (Routine X 2) w Reflex to ID Panel     Status: None (Preliminary result)   Collection Time: 05/21/19  5:27 PM   Specimen: BLOOD  Result Value Ref Range Status   Specimen Description BLOOD LEFT ANTECUBITAL  Final   Special Requests   Final    BOTTLES DRAWN AEROBIC  AND ANAEROBIC Blood Culture adequate volume   Culture   Final    NO GROWTH < 24 HOURS Performed at [facility], [address] [place], [place] [address]    Report Status PENDING  Incomplete  Culture, blood (Routine X 2) w Reflex to ID Panel     Status: None (Preliminary result)   Collection Time: 05/21/19  5:32 PM   Specimen: BLOOD  Result Value Ref Range Status   Specimen Description BLOOD BLOOD LEFT HAND  Final   Special Requests   Final    BOTTLES DRAWN AEROBIC AND ANAEROBIC Blood Culture adequate volume   Culture   Final    NO GROWTH < 24 HOURS Performed at [facility], [address] [place], [place] [address]    Report Status PENDING  Incomplete    Studies/Results: CT Chest W Contrast  Result Date: 05/21/2019 CLINICAL DATA:  Shortness of breath, COVID  positive on 05/02/2019. Difficulty breathing EXAM: CT CHEST WITH CONTRAST TECHNIQUE: Multidetector CT imaging of the chest was performed during intravenous contrast administration. CONTRAST:  96mL OMNIPAQUE IOHEXOL 300 MG/ML  SOLN COMPARISON:  05/01/2019 FINDINGS: Cardiovascular: Right-sided PICC line terminates at the caval to atrial junction. Thoracic aortic caliber is normal with signs of atherosclerotic calcification. Central pulmonary vasculature mildly engorged. Mediastinum/Nodes: Thoracic inlet structures are unremarkable. No axillary lymphadenopathy. No mediastinal adenopathy. Large hiatal hernia. 2 cm right hilar lymph node, when measured in a similar fashion on the prior study was approximately 1.6 cm. Similar appearance of subcarinal nodal enlargement. Moderately large hiatal hernia is unchanged moderate long segment esophageal thickening. Lungs/Pleura: Filling of the bronchus intermedius and right lower lobe bronchus with material. Interval development of new cystic and partially fluid-filled structure in the right lower lobe along the major fissure (image 75, series 2) 3.4 x 4.0 cm. Patchy areas of consolidative change in the right lung base. Background pulmonary emphysema with some ground-glass and interstitial thickening in the left chest as well. Process more pronounced on the right. Airways to the left chest without gross filling defect or debris. Small to moderate sub pulmonic right-sided pleural fluid collection. Small gas locules in addition to gas in fluid along the fissure are seen medially in the chest as well. Biapical scarring. Upper Abdomen: Signs of hepatic cysts and presumed renal cysts. Upper abdominal contents are incompletely imaged. Musculoskeletal: No signs of acute bone finding or evidence of destructive bone process. Osteopenia. Spinal degenerative changes. Multifocal lower thoracic and upper lumbar compression fractures are unchanged from recent comparison evaluation. IMPRESSION:  1. Filling of the bronchus intermedius and right lower lobe bronchus with material. Patchy areas of consolidative change in the right lung base. Findings concerning for pneumonitis/pneumonia due to aspiration with developing post infectious pneumatocele/abscess. 2. Moderate right-sided pleural effusion with loculated appearance and small locules of gas, additional explanation for the larger cystic area of may represent trapped gas and fluid within the major fissure. Correlate with any recent intervention/thoracentesis. 3. Background of pulmonary emphysema with some ground-glass and interstitial thickening in the left chest as well, to a lesser extent. 4. Large hiatal hernia with diffuse esophageal thickening, potentially related to esophagitis. 5. Worsening of sequela of pneumonitis in the right chest may explain enlarging mediastinal lymph nodes. Follow-up is suggested to ensure resolution. 6. Similar appearance of subcarinal nodal enlargement. 7. Multifocal lower thoracic and upper lumbar compression fractures are unchanged from recent comparison evaluation. Aortic Atherosclerosis (ICD10-I70.0) and Emphysema (ICD10-J43.9). Electronically Signed   By: Jason Fila.D.  On: 05/21/2019 11:02   DG Chest Port 1 View  Result Date: 05/21/2019 CLINICAL DATA:  Shortness of breath.  COVID-19 positive EXAM: PORTABLE CHEST 1 VIEW COMPARISON:  May 09, 2019 and May 01, 2019 FINDINGS: There is persistent airspace opacity in the right mid lung and throughout the left lower lung regions, with slightly increased consolidation in the right mid lung essentially stable findings in the left base. There is bilateral apical pleural thickening, stable. There is a small right pleural effusion, stable. Heart size and pulmonary vascularity are normal. No adenopathy. There is an apparent hiatal hernia. Central catheter tip is in the superior vena cava. No pneumothorax. No bone lesions. IMPRESSION: Airspace opacity  bilaterally, with slightly greater consolidation in the right mid lung compared to most recent study and similar infiltrate in the left base region. Stable apical pleural thickening. Stable cardiac silhouette. Hiatal hernia present. Central catheter tip in superior vena cava. Electronically Signed   By: Bretta Bang III M.D.   On: 05/21/2019 07:55    Assessment/Plan: Sun Wilensky is a 79 y.o. male with hx emphysema,chronic hypoxic respiratory failure on 2 L of oxygen,seizure disorder,hypertension,depression andcoronary artery disease and recent COVID-19 infection. He was hospitalized at [facility] for MRSA PNA and bacteremia and just finished 2 weeks IV abx on 3/7. Admitted from [facility] with increasing SOB. On admit wbc 7, no fevers, sats 93% pm 2L. CT scan shows RLE consolidation and mod pleural effusion with  locules of gas. Has a lung abscess noted as well.  Also large hiatal hernia He has hx recent ESBL E coli UTI as well (S bactrim). Currently he does not appear toxic, has no fevers, leukocytosis. BCX pending.  I did review CT with rads and the effusion is not that large, but could be tapped for dx if needed. There more concerning finding is the lung abscess which would not be amendable to drainage.  For lung abscesses we will usually treat with prolonged abx (IV then oral). It would be best to get sputum cx if possible but he tells me he is not producing sputum. 3/9 - no fevers, wbc nml. Unable to produce sputum. 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   Recommendations Check sputum cx if possible. Cont vanco for prior MRSA while inpt.  Change meropenem to zosyn for now while inpt.  If clinically improving, wbc nml and unable to produce sputum I would suggest we treat empirically for a lung abscess and for MRSA PNA. His prior MRSA from  blood was S to bactrim as was his ESBL E coli from urine. If improving and ready for dc would plan on  - 3 week course of augmentin and bactrim with follow up imaging in 3 weeks to ensure improvement in the abscess.  Thank you very much for the consult. Will follow with you.  Mick Sell   05/22/2019, 2:52 PM

## 2019-05-22 NOTE — Evaluation (Addendum)
Clinical/Bedside Swallow Evaluation Patient Details  Name: Paul Suarez MRN: [id number] Date of Birth: 10-22-1940  Today's Date: 05/22/2019 Time: SLP Start Time (ACUTE ONLY): 1000 SLP Stop Time (ACUTE ONLY): 1100 SLP Time Calculation (min) (ACUTE ONLY): 60 min  Past Medical History:  Past Medical History:  Diagnosis Date  . CAD (coronary artery disease)   . COPD (chronic obstructive pulmonary disease) ([place])   . Depression   . Essential hypertension   . Seizure disorder ([place])   . Staphylococcus aureus bacteremia 05/06/2019   Past Surgical History:  Past Surgical History:  Procedure Laterality Date  . BELOW KNEE LEG AMPUTATION Left    HPI:  Pt is a 79 y.o. gentleman with history of CAD and PVD s/p BKA, Severe centrilobular emphysema on home oxygen 2LNC, who was previously diagnosed with COVID 19 pneumonia. Has a history of recurrent resistant ESBL producing organisms. Recent outpatient anitbiotics courses for concern for post-covid bacterial pneumonia. Patient was discharged from the hospital after treatment for staphylococcal sepsis secondary to pneumonia.  He was discharged back to the skilled nursing facility([facility] with IV vancomycin and completed dose of antibiotic on 05/20/19.  CT of CHEST 05/21/2019: "CT scan of the chest which showed Large hiatal hernia with diffuse esophageal thickening potentially related to esophagitis, aspiration pneumonia, post infectious abscess/pneumatocele, pulmonary emphysema with some ground-glass presentation".   Assessment / Plan / Recommendation Clinical Impression  Pt appeared to demonstrate grossly adequate oropharyngeal phase swallow function w/ no immediate clinical s/s of aspiration or decline in status w/ trials given at evaluation today. Pt appeared at reduced risk for aspiration when following general aspiration precautions. However, pt does have a Large Hiatal Hernia per CT of Chest which can increase risk for Regurgitation/Backflow of food/liquid  material from the Esophagus to the Pharynx thus increasing risk for aspiration of the Reflux material.  At Baseline, pt exhibited a mildly wet vocal quality w/ own saliva. This has been noted previously during assessment per chart notes. Also noted was a BSE/MBSS performed in Feb 2021 at a prior hospitalization. No laryngeal penetration or aspiration was noted to occur at the [facility] performed. Pt was placed on a regular diet w/ thin liquids w/ aspiration precautions at that time(family wanted pt's food consistency more liberal despite Edentulous status, per report).  During this BSE today, pt consumed several trials of thin liquids via CUP, puree and a few soft solid food trials. No overt, clinical s/s of aspiration to include coughing, declined respiratory effort, and no consistently wet vocal quality was noted immediately post swallows/trials. The inconsistently wet vocal quality was appreciate, especially when pt talked for an increased period of time. He seemed unaware of the need to swallow to clear saliva. This increased saliva could be related to pt's Baseline Esophageal phase issues/irritation and Large Hiatal Hernia as noted per Chest CT this admission. Any Esophageal irritation can increase Esophageal phlegm, saliva, and pooling of secretions. During the oral phase, pt exhibited adequate bolus management/control and timely A-P transfer w/ all trials given. Min increased mastication effort to gum/mash the increased textured trials accepted was noted, but oral clearing achieved given min Time. Encouraged pt to orally clear by alternating foods/liquids as well as a f/u, Dry swallow when needed. Educated pt on the need for Dry swallows to clear any pooling of saliva/residue at any time. Pt was easily Distracted and required verbal/visual cues to redirect attention. Pt fed self -- noted tremorous, shaky UEs. OM exam was cursory; no unilateral weakness noted. Pt is edentulous.  Recommend a Dysphagia level 2  (minced foods) w/ thin liquids VIA CUP; No Straws. General aspiration precautions. REFLUX precautions. Tray setup at meals. Pills strongly recommended Whole in PUREE.  Pt appears close to/at his Baseline per chart notes. Addendum: recommend discussion b/t pt/family and MD and Palliative Care re: pt's GOC w/ regard to his diet consistency as family/pt have asked at previous admission for pt's diet to be more liberal w/ regard to food consistency. Bulkier solid consistency foods in diet including meats, breads can increase Esophageal dysmotility in light of Large Hiatal Hernia which can in turn increase chance for Esophageal Regurgitation and backflow of material.   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 General aspiration precautions. REFLUX Precautions. Tray setup and positioning at meals.   Medication Administration: Whole meds with puree(for safer swallowing)    Other  Recommendations Recommended Consults: Consider GI evaluation;Consider esophageal assessment(Dietician f/u; Palliative Care f/u for GOC) Oral Care Recommendations: Oral care BID;Oral care before and after PO;Staff/trained caregiver to provide oral care Other Recommendations: (n/a)   Follow up Recommendations None      Frequency and Duration (n/a)  (n/a)       Prognosis Prognosis for Safe Diet Advancement: Fair(-Good) Barriers to Reach Goals: Cognitive deficits;Time post onset;Severity of deficits;Behavior      Swallow Study   General Date of Onset: 05/21/19 HPI: Pt is a 79 y.o. gentleman with history of CAD and PVD s/p BKA, Severe centrilobular emphysema on home oxygen 2LNC, who was previously diagnosed with COVID 19  pneumonia. Has a history of recurrent resistant ESBL producing organisms. Recent outpatient anitbiotics courses for concern for post-covid bacterial pneumonia. Patient was discharged from the hospital after treatment for staphylococcal sepsis secondary to pneumonia.  He was discharged back to the skilled nursing facility([facility]) with IV vancomycin and completed dose of antibiotic on 05/20/19.  CT of CHEST 05/21/2019: "CT scan of the chest which showed Large hiatal hernia with diffuse esophageal thickening potentially related to esophagitis, aspiration pneumonia, post infectious abscess/pneumatocele, pulmonary emphysema with some ground-glass presentation". Type of Study: Bedside Swallow Evaluation Previous Swallow Assessment: BSE 05/07/2019; MBSS 05/08/2019; DtrR reports hx of puree diet with weight loss in the past Diet Prior to this Study: NPO(regular/soft foods at home w/ thin liquids, per pt/report) Temperature Spikes Noted: No(wbc 7.4) Respiratory Status: Nasal cannula(4L) History of Recent Intubation: No Behavior/Cognition: Alert;Cooperative;Pleasant mood;Distractible;Requires cueing Oral Cavity Assessment: Dry(min) Oral Care Completed by SLP: Yes Oral Cavity - Dentition: Edentulous Vision: Functional for self-feeding Self-Feeding Abilities: Able to feed self;Needs assist;Needs set up(Tremorous UEs) Patient Positioning: Upright in bed(needed positioning support for upright sitting) Baseline Vocal Quality: Normal;Wet(intermittent w/ own saliva) Volitional Cough: (adequate but cognitively did not follow through well) Volitional Swallow: Unable to elicit(cognitive)    Oral/Motor/Sensory Function Overall Oral Motor/Sensory Function: Within functional limits(grossly)   Ice Chips Ice chips: Within functional limits   Thin Liquid Thin Liquid: Within functional limits Presentation: Cup;Self Fed(10+ sips) Pharyngeal  Phase Impairments: (None) Other Comments: "I want some more"    Nectar Thick Nectar  Thick Liquid: Not tested   Honey Thick Honey Thick Liquid: Not tested   Puree Puree: Within functional limits Presentation: Spoon;Self Fed(8 trials)   Solid     Solid: Impaired Presentation: Spoon(fed; 3 tirals) Oral Phase Impairments: Impaired mastication(Edentulous)  Oral Phase Functional Implications: Impaired mastication;Prolonged oral transit(Edentulous) Pharyngeal Phase Impairments: (None) Other Comments: did not accept many trials d/t Cognitive follow through challenges during tasks       Jerilynn Som, MS, CCC-SLP Melodi Happel 05/22/2019,2:46 PM

## 2019-05-22 NOTE — TOC Progression Note (Signed)
Transition of Care [facility]) - Progression Note    Patient Details  Name: Paul Suarez MRN: [id number] Date of Birth: 1940-06-28  [facility] CM/SW Contact  Barrie Dunker, RN Phone Number: 05/22/2019, 11:49 AM  Clinical Narrative:     [facility] bed offer, I notified the daughter of the bed offer, she accepted  Expected Discharge Plan: Skilled Nursing Facility Barriers to Discharge: Continued Medical Work up  Expected Discharge Plan and Services Expected Discharge Plan: Skilled Nursing Facility     Post Acute Care Choice: Skilled Nursing Facility Living arrangements for the past 2 months: Skilled Nursing Facility                                       Social Determinants of Health (SDOH) Interventions    Readmission Risk Interventions No flowsheet data found.

## 2019-05-23 DIAGNOSIS — J851 Abscess of lung with pneumonia: Secondary | ICD-10-CM

## 2019-05-23 DIAGNOSIS — G40909 Epilepsy, unspecified, not intractable, without status epilepticus: Secondary | ICD-10-CM

## 2019-05-23 DIAGNOSIS — E43 Unspecified severe protein-calorie malnutrition: Secondary | ICD-10-CM

## 2019-05-23 DIAGNOSIS — D638 Anemia in other chronic diseases classified elsewhere: Secondary | ICD-10-CM

## 2019-05-23 LAB — BASIC METABOLIC PANEL
Anion gap: 6 (ref 5–15)
BUN: 14 mg/dL (ref 8–23)
CO2: 26 mmol/L (ref 22–32)
Calcium: 7.5 mg/dL — ABNORMAL LOW (ref 8.9–10.3)
Chloride: 108 mmol/L (ref 98–111)
Creatinine, Ser: 0.45 mg/dL — ABNORMAL LOW (ref 0.61–1.24)
GFR calc Af Amer: >60 mL/min (ref >60)
GFR calc non Af Amer: >60 mL/min (ref >60)
Glucose, Bld: 86 mg/dL (ref 70–99)
Potassium: 3.5 mmol/L (ref 3.5–5.1)
Sodium: 140 mmol/L (ref 135–145)

## 2019-05-23 LAB — CBC
HCT: 24.9 % — ABNORMAL LOW (ref 39.0–52.0)
Hemoglobin: 7.6 g/dL — ABNORMAL LOW (ref 13.0–17.0)
MCH: 30.6 pg (ref 26.0–34.0)
MCHC: 30.5 g/dL (ref 30.0–36.0)
MCV: 100.4 fL — ABNORMAL HIGH (ref 80.0–100.0)
Platelets: 185 10*3/uL (ref 150–400)
RBC: 2.48 MIL/uL — ABNORMAL LOW (ref 4.22–5.81)
RDW: 17.9 % — ABNORMAL HIGH (ref 11.5–15.5)
WBC: 5.7 10*3/uL (ref 4.0–10.5)
nRBC: 0 % (ref 0.0–0.2)

## 2019-05-23 LAB — MAGNESIUM: Magnesium: 1.9 mg/dL (ref 1.7–2.4)

## 2019-05-23 MED ORDER — AMOXICILLIN-POT CLAVULANATE 400-57 MG/5ML PO SUSR
875.0000 mg | Freq: Two times a day (BID) | ORAL | Status: DC
  Administered 2019-05-23 – 2019-05-24 (×3): 875 mg via ORAL
  Filled 2019-05-23: qty 10.9
  Filled 2019-05-23: qty 10.94
  Filled 2019-05-23 (×2): qty 10.9

## 2019-05-23 MED ORDER — SULFAMETHOXAZOLE-TRIMETHOPRIM 200-40 MG/5ML PO SUSP
20.0000 mL | Freq: Two times a day (BID) | ORAL | Status: DC
  Administered 2019-05-23 – 2019-05-24 (×3): 20 mL via ORAL
  Filled 2019-05-23 (×4): qty 20

## 2019-05-23 MED ORDER — CLOPIDOGREL BISULFATE 75 MG PO TABS
75.0000 mg | ORAL_TABLET | Freq: Every day | ORAL | Status: DC
  Administered 2019-05-23 – 2019-05-24 (×2): 75 mg via ORAL
  Filled 2019-05-23 (×2): qty 1

## 2019-05-23 NOTE — Progress Notes (Signed)
Daily Progress Note   Patient Name: Paul Suarez       Date: 05/23/2019 DOB: 12-31-1940  Age: 79 y.o. MRN#: [id number] Attending Physician: Fritzi Mandes, MD Primary Care Physician: Hendricks Limes, MD Admit Date: 05/21/2019  Reason for Consultation/Follow-up: Establishing goals of care  Subjective: Patient is in bed. Daughter is at bedside. She is happy her father will be going to SNF facility. Broached discussion of [place]. She states she will need to talk with her sister for Paul Suarez, but confirms DNR status, and feels he would not want a feeding tube. Recommend palliative to follow outpatient as she voices that he does what he wants to do, and his care will be a matter of what he will allow.   Length of Stay: 2  Current Medications: Scheduled Meds:  . divalproex  500 mg Oral QHS  . docusate sodium  100 mg Oral QHS  . enoxaparin (LOVENOX) injection  40 mg Subcutaneous Q24H  . ipratropium-albuterol  3 mL Nebulization QID  . pantoprazole  40 mg Oral Daily  . polyethylene glycol  17 g Oral Daily  . sertraline  25 mg Oral Daily  . tiotropium  18 mcg Inhalation Daily    Continuous Infusions: . piperacillin-tazobactam (ZOSYN)  IV 3.375 g (05/23/19 1337)  . vancomycin Stopped (05/23/19 0552)    PRN Meds: guaiFENesin, Melatonin  Physical Exam Neurological:     Mental Status: He is alert.             Vital Signs: BP 99/65 (BP Location: Left Arm)   Pulse 95   Temp 98.2 F (36.8 C) (Oral)   Resp 17   Ht 5\' 11"  (1.803 m)   Wt 65.8 kg   SpO2 93%   BMI 20.22 kg/m  SpO2: SpO2: 93 % O2 Device: O2 Device: Nasal Cannula O2 Flow Rate: O2 Flow Rate (L/min): 3 L/min  Intake/output summary:   Intake/Output Summary (Last 24 hours) at 05/23/2019 1452 Last data filed at 05/23/2019 1000 Gross  per 24 hour  Intake 250 ml  Output 1350 ml  Net -1100 ml   LBM: Last BM Date: 05/22/19 Baseline Weight: Weight: 65.8 kg Most recent weight: Weight: 65.8 kg       Palliative Assessment/Data:      Patient Active Problem List   Diagnosis Date Noted  .  Aspiration pneumonia (HCC) 05/21/2019  . Anemia of chronic disease 05/21/2019  . Protein-calorie malnutrition, severe 05/10/2019  . Pressure injury of skin 05/08/2019  . Staphylococcus aureus bacteremia 05/06/2019  . Severe sepsis (HCC) 05/02/2019  . Acute on chronic respiratory failure with hypoxia (HCC) 05/02/2019  . AKI (acute kidney injury) (HCC) 05/02/2019  . Acute blood loss anemia 05/02/2019  . Tachycardia 04/26/2019  . AMS (altered mental status) 02/13/2019  . UTI due to extended-spectrum beta lactamase (ESBL) producing Escherichia coli 02/12/2019  . CAD (coronary artery disease) 02/12/2019  . Essential hypertension 02/12/2019  . Vitamin D deficiency 02/12/2019  . Seasonal allergies 02/12/2019  . Muscle spasm/History of left BKA 02/12/2019  . Insomnia 02/12/2019  . COPD with acute exacerbation (HCC) 02/12/2019  . Major depression, recurrent (HCC) 02/12/2019  . Hypokalemia 02/12/2019  . Constipation 02/12/2019  . Seizure disorder (HCC) 02/12/2019    Palliative Care Assessment & Plan   Recommendations/Plan:  Recommend palliative to follow at D/C.   Code Status:    Code Status Orders  (From admission, onward)         Start     Ordered   05/21/19 1333  Do not attempt resuscitation (DNR)  Continuous    Question Answer Comment  In the event of cardiac or respiratory ARREST Do not call a "code blue"   In the event of cardiac or respiratory ARREST Do not perform Intubation, CPR, defibrillation or ACLS   In the event of cardiac or respiratory ARREST Use medication by any route, position, wound care, and other measures to relive pain and suffering. May use oxygen, suction and manual treatment of airway  obstruction as needed for comfort.   Comments Discussed code status with patient's daughter Paul Suarez and he is a DNR      05/21/19 1337        Code Status History    Date Active Date Inactive Code Status Order ID Comments User Context   05/06/2019 1605 05/11/2019 2058 DNR [id number]  Charlott Holler, MD Inpatient   05/02/2019 2024 05/06/2019 1605 Full Code [id number]  Briscoe Deutscher, MD ED   Advance Care Planning Activity    Advance Directive Documentation     Most Recent Value  Type of Advance Directive  Healthcare Power of Attorney, Living will, Out of facility DNR (pink MOST or yellow form)  Pre-existing out of facility DNR order (yellow form or pink MOST form)  --  "MOST" Form in Place?  --       Prognosis:   Unable to determine  Discharge Planning:  Skilled Nursing Facility for rehab with Palliative care service follow-up   Thank you for allowing the Palliative Medicine Team to assist in the care of this patient.   Total Time 35 min Prolonged Time Billed no      Greater than 50%  of this time was spent counseling and coordinating care related to the above assessment and plan.  Morton Stall, NP  Please contact Palliative Medicine Team phone at [phone] for questions and concerns.

## 2019-05-23 NOTE — TOC Progression Note (Signed)
Transition of Care [facility]) - Progression Note    Patient Details  Name: Paul Suarez MRN: [id number] Date of Birth: 29-Jan-1941  [facility] CM/SW Contact  Barrie Dunker, RN Phone Number: 05/23/2019, 2:34 PM  Clinical Narrative:    Provided the daughter with a letter stating the patient does not have capacity written by physician    Expected Discharge Plan: Skilled Nursing Facility Barriers to Discharge: Continued Medical Work up  Expected Discharge Plan and Services Expected Discharge Plan: Skilled Nursing Facility     Post Acute Care Choice: Skilled Nursing Facility Living arrangements for the past 2 months: Skilled Nursing Facility                                       Social Determinants of Health (SDOH) Interventions    Readmission Risk Interventions Readmission Risk Prevention Plan 05/22/2019  Transportation Screening Complete  PCP or Specialist Appt within 3-5 Days Complete  HRI or Home Care Consult Complete  Social Work Consult for Recovery Care Planning/Counseling Complete  Palliative Care Screening Complete  Medication Review [facility]) Referral to Pharmacy

## 2019-05-23 NOTE — Progress Notes (Addendum)
[facility]  - [facility]   PATIENT NAME: Paul Suarez    MR#:  [id number]  DATE OF BIRTH:  [birth year]  SUBJECTIVE:  patient awake alert denies any shortness of breath. Has some cough with poor cough reflex. Daughter in the room. Patient eating food brought by daughter.  REVIEW OF SYSTEMS:   Review of Systems  Constitutional: Negative for chills, fever and weight loss.  HENT: Negative for ear discharge, ear pain and nosebleeds.   Eyes: Negative for blurred vision, pain and discharge.  Respiratory: Positive for cough. Negative for sputum production, shortness of breath, wheezing and stridor.   Cardiovascular: Negative for chest pain, palpitations, orthopnea and PND.  Gastrointestinal: Negative for abdominal pain, diarrhea, nausea and vomiting.  Genitourinary: Negative for frequency and urgency.  Musculoskeletal: Negative for back pain and joint pain.  Neurological: Positive for weakness. Negative for sensory change, speech change and focal weakness.  Psychiatric/Behavioral: Negative for depression and hallucinations. The patient is not nervous/anxious.    Tolerating Diet:yes Tolerating PT: patient is wheelchair-bound. Does not walk according to the daughter for last four years  DRUG ALLERGIES:  No Known Allergies  VITALS:  Blood pressure 99/65, pulse 95, temperature 98.2 F (36.8 C), temperature source Oral, resp. rate 17, height 5\' 11"  (1.803 m), weight 65.8 kg, SpO2 93 %.  PHYSICAL EXAMINATION:   Physical Exam  GENERAL:  79 y.o.-year-old patient lying in the bed with no acute distress. thin EYES: Pupils equal, round, reactive to light and accommodation. No scleral icterus.   HEENT: Head atraumatic, normocephalic. Oropharynx and nasopharynx clear.  NECK:  Supple, no jugular venous distention. No thyroid enlargement, no tenderness.  LUNGS: coarse upper resp breath sounds bilaterally, no wheezing, rales, rhonchi. No use of accessory muscles of  respiration.  CARDIOVASCULAR: S1, S2 normal. No murmurs, rubs, or gallops.  ABDOMEN: Soft, nontender, nondistended. Bowel sounds present. No organomegaly or mass.  EXTREMITIES: No cyanosis, clubbing or edema b/l.   PICC line right upper extremity NEUROLOGIC: Cranial nerves II through XII are intact. No focal Motor or sensory deficits b/l.  Moves all extremites well  PSYCHIATRIC:  patient is alert and oriented x 2 SKIN: stage II sacral ulcer POA per RN document  LABORATORY PANEL:  CBC Recent Labs  Lab 05/23/19 0617  WBC 5.7  HGB 7.6*  HCT 24.9*  PLT 185    Chemistries  Recent Labs  Lab 05/23/19 0617  NA 140  K 3.5  CL 108  CO2 26  GLUCOSE 86  BUN 14  CREATININE 0.45*  CALCIUM 7.5*  MG 1.9   Cardiac Enzymes No results for input(s): TROPONINI in the last 168 hours. RADIOLOGY:  No results found. ASSESSMENT AND PLAN:  Mitsuru Dault a 79 y.o.Caucasian malewith medical history significant foremphysema,chronic hypoxic respiratory failure on 2 L of oxygen,seizure disorder,hypertension,depression andcoronary artery disease and recent COVID-19 infection who was [facility] for evaluation of shortness of breath. Patient was discharged from the hospital to SNF on 2/26/21after treatment for staphylococcal sepsis secondary to pneumonia.He was discharged with IV vancomycin and completed dose of antibiotic on 05/20/19.   # Increased dyspnea 2/2 Aspiration pneumonia # Lung abscess # Reflux and regurgitation 2/2 large hiatal hernia --On admit wbc 7, no fevers, sats 93% on 2L (baseline). - CT scan showed RLE consolidation and mod pleural effusion with locules of gas. Has a lung abscess noted as well.Also large hiatal hernia. --ID consulted on admission, and "suspect he has a chronic  aspiration and related lung abscess (usually anaerobic bacteremia and oral flora) but given recent MRSA Bacteremia cannot rule out MRSA PNA so have to  treat for both."--recommends PO amended and Bactrim for three weeks --SLP eval suggested aspiration did not come from swallow defect, so likely has regurgitation and backflow of food/liquid 2/2 large hiatal hernia. --At discharge, prescribe "3 week course of augmentin and bactrim with follow up imaging in 3 weeks to ensure improvement in the abscess.", per ID rec. --Dysphagia level 2 (minced foods) w/ thin liquids VIA CUP; No Straws --cont oral PPI -- patient did have modified barium swallow done on 21st February in [place]. 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   he is wheelchair-bound.   DVT prophylaxis: Lovenox SQ Code Status: DNR  Family Communication: updated daughter in the room [facility]   TOTAL TIME TAKING CARE OF THIS PATIENT: *30* minutes.  >50% time spent on counselling and coordination of care  Note: This dictation was prepared with Dragon dictation along with smaller phrase technology. Any transcriptional errors that result from this process are unintentional.  Fritzi Mandes M.D    [facility]   CC: Primary care physician; Hendricks Limes, MDPatient ID: Paul Suarez, male   DOB: 04-05-1940, 79 y.o.    MRN: [id number]

## 2019-05-24 LAB — MAGNESIUM: Magnesium: 2 mg/dL (ref 1.7–2.4)

## 2019-05-24 MED ORDER — PANTOPRAZOLE SODIUM 40 MG PO TBEC: 40.0000 mg | DELAYED_RELEASE_TABLET | Freq: Every day | ORAL | 0 refills | Status: DC

## 2019-05-24 MED ORDER — AMOXICILLIN-POT CLAVULANATE 400-57 MG/5ML PO SUSR: 875.0000 mg | Freq: Two times a day (BID) | ORAL | 0 refills | Status: AC

## 2019-05-24 MED ORDER — SULFAMETHOXAZOLE-TRIMETHOPRIM 200-40 MG/5ML PO SUSP: 20.0000 mL | Freq: Two times a day (BID) | ORAL | 0 refills | Status: AC

## 2019-05-24 NOTE — Progress Notes (Signed)
This Clinical research associate attempt to call [facility] at [phone] x [phone] , transferred to Voice mail . Left VM for nurse to call back to this number [phone]

## 2019-05-24 NOTE — Progress Notes (Signed)
Spoke with Diplomatic Services operational officer at [facility] , to attempt to give report again on patient. She states " I will let them know " and hung up the phone. Left my number for Nurse who will be taking report to call this Clinical research associate. Patient is being transported via EMS . VSS ,. IV removed from LFA .

## 2019-05-24 NOTE — Progress Notes (Signed)
New referral for [employer] community Palliative program to follow at [facility] received from [facility]. Patient discharging today. Patient information given to referral. Thank you,. Dayna Barker BSN, RN, [facility] [employer] [phone]

## 2019-05-24 NOTE — Discharge Instructions (Addendum)
Keep pt on continuous oxygen and titrate oxygen to keep saturations >92% Dietitian and Speech Therapy to follow at the facility Aspiration precautions Pulmonary rehab--encourage pt to use Flutter valve  Diet orders: Dysphagia level 2 (MINCED foods moistened for easier gumming/mashing); thin liquids via CUP, No Straws. General aspiration precautions. REFLUX Precautions. Tray setup and positioning at meals.   Medication Administration: Whole meds with puree(for safer swallowing)

## 2019-05-24 NOTE — Discharge Summary (Signed)
[facility] [place] at [facility]   PATIENT NAME: Paul Suarez    MR#:  [id number]  DATE OF BIRTH:  1940/05/04  DATE OF ADMISSION:  05/21/2019 ADMITTING PHYSICIAN: Lucile Shutters, MD  DATE OF DISCHARGE: 05/24/2019  PRIMARY CARE PHYSICIAN: Pecola Lawless, MD    ADMISSION DIAGNOSIS:  Aspiration pneumonia (HCC) [J69.0] Pneumonitis [J18.9] Dyspnea, unspecified type [R06.00]  DISCHARGE DIAGNOSIS:  Aspiration pneumonia-- recurrent Lung abscess Gerd/regurgitation secondary to large hiatal hernia History of chronic COPD with chronic respiratory failure on oxygen chronic  SECONDARY DIAGNOSIS:   Past Medical History:  Diagnosis Date  . CAD (coronary artery disease)   . COPD (chronic obstructive pulmonary disease) (HCC)   . Depression   . Essential hypertension   . Seizure disorder (HCC)   . Staphylococcus aureus bacteremia 05/06/2019    HOSPITAL COURSE:   Paul Suarez a 79 y.o.Caucasianmalewith medical history significant foremphysema,chronic hypoxic respiratory failure on chronic oxygen,seizure disorder,hypertension,depression andcoronary artery disease and recent COVID-19 infectionwh[facility]as [facility] breath. Patient was discharged from the hospitalto SNF on 2/26/21after treatment for staphylococcal sepsis secondary to pneumonia.He was discharged with IV vancomycin and completed dose of antibiotic on 05/20/19.  # Increased dyspnea 2/2Aspiration pneumonia # Lung abscess # Reflux and regurgitation 2/2 large hiatal hernia --On admit wbc 7, no fevers, sats 93%on3L. - CT scan showedRLE consolidation and mod pleural effusion with locules of gas. Has a lung abscess noted as well.Also large hiatal hernia. --ID consulted on admission with Dr Sampson Goon, and "suspect he has a chronic aspirationandrelatedlungabscess (usually anaerobic bacteremia and oral flora) but given  recent MRSA Bacteremia cannot rule out MRSA PNA so have to treat for both."--recommends PO Augmentin and Bactrim for three weeks. --SLP eval suggested aspiration did not come from swallow defect, so likely has regurgitation and backflow of food/liquid 2/2 large hiatal hernia. --Dysphagia level 2 (minced foods) w/ thin liquids VIA CUP; No Straws --cont oral PPI -- patient did have modified barium swallow done on 21st February in [place] [facility]. Did not show any obvious aspiration -- continues to remain afebrile white count normal with some chronic cough without able to produce any phlegm -- patient seen by pulmonary.--- Seems he is too deconditioned and frail to handle bronchoscopy. --Cont oxygen and titrate to keep sats >92%  COPD with chronic respiratory failureon chronic O2 --stable, no exacerbation --Continue as needed bronchodilator therapy and Spiriva --Continue oxygen supplementation to maintain pulse oximetry>90-92%  Seizure Disorder --cont on seizure precautions --Continue Depakote  Anemia of chronic disease -no active bleeding -hgb 7.6  Hx of CAD Cont plavix  # Rapid cognitive decline and likely dementia --Per daughter, pt has had multiple hospitalizations recently and rapid decline both physically and mentally.He is wheelchair-bound.  # severe protein Calorie malnutrition in the context of Chronic illness -pt was evaluated by Dietitian on 2/25 at [facility]  Pt is at a high risk for re-admission given his co-morbidities and risk for recurrent aspiration with Respiratory failure  Palliative care input appreciated. Pt will be followed by Palliative care as out pt Daughter Paul Suarez understands overall poor prognosis.  DVT prophylaxis:Lovenox SQ Code Status:DNR prior to admission Family Communication:updated daughter [facility]  CONSULTS OBTAINED:  Treatment Team:  Salena Saner, MD Mick Sell, MD  DRUG ALLERGIES:   No Known Allergies  DISCHARGE MEDICATIONS:   Allergies as of 05/24/2019   No Known Allergies     Medication List  STOP taking these medications   magnesium hydroxide 400 MG/5ML suspension Commonly known as: MILK OF MAGNESIA   OXYGEN   oxymetazoline 0.05 % nasal spray Commonly known as: AFRIN   vancomycin  IVPB     TAKE these medications   albuterol 108 (90 Base) MCG/ACT inhaler Commonly known as: VENTOLIN HFA Inhale 2 puffs into the lungs every 6 (six) hours as needed for shortness of breath (FOR Shortness of Breath  (S.O.B.)).   amoxicillin-clavulanate 400-57 MG/5ML suspension Commonly known as: AUGMENTIN Take 10.9 mLs (875 mg total) by mouth every 12 (twelve) hours for 21 days.   cholecalciferol 25 MCG (1000 UNIT) tablet Commonly known as: VITAMIN D Take 1 tablet (1,000 Units total) by mouth daily.   clopidogrel 75 MG tablet Commonly known as: Plavix Take 1 tablet (75 mg total) by mouth daily.   Dermacloud Crea Apply topically. Every shift   divalproex 500 MG 24 hr tablet Commonly known as: Depakote ER Take 1 tablet (500 mg total) by mouth at bedtime.   docusate sodium 100 MG capsule Commonly known as: Colace Take 1 capsule (100 mg total) by mouth at bedtime.   guaiFENesin 100 MG/5ML liquid Commonly known as: ROBITUSSIN Take 200 mg by mouth 4 (four) times daily as needed for cough.   ipratropium-albuterol 0.5-2.5 (3) MG/3ML Soln Commonly known as: DUONEB Take 3 mLs by nebulization 4 (four) times daily. For SOB and Wheezing What changed: Another medication with the same name was removed. Continue taking this medication, and follow the directions you see here.   loratadine 10 MG tablet Commonly known as: CLARITIN Take 1 tablet (10 mg total) by mouth daily.   Melatonin 3 MG Tabs Take 3 mg by mouth at bedtime as needed (sleep).   multivitamin with minerals Tabs tablet Take 1 tablet by mouth daily.   Nutritional Supplement Liqd Take 120 mLs by  mouth 2 (two) times daily. MedPass   NUTRITIONAL SUPPLEMENT PO Take 1 each by mouth daily. Magic Cup   pantoprazole 40 MG tablet Commonly known as: PROTONIX Take 1 tablet (40 mg total) by mouth daily.   polyethylene glycol powder 17 GM/SCOOP powder Commonly known as: GLYCOLAX/MIRALAX Take 17 g by mouth daily.   sertraline 50 MG tablet Commonly known as: ZOLOFT Take 25 mg by mouth daily.   Spiriva HandiHaler 18 MCG inhalation capsule Generic drug: tiotropium Place 1 capsule (18 mcg total) into inhaler and inhale daily.   sulfamethoxazole-trimethoprim 200-40 MG/5ML suspension Commonly known as: BACTRIM Take 20 mLs by mouth every 12 (twelve) hours for 21 days.   vitamin C 500 MG tablet Commonly known as: ASCORBIC ACID Take 1 tablet (500 mg total) by mouth daily.   zinc sulfate 220 (50 Zn) MG capsule Take 220 mg by mouth daily.       If you experience worsening of your admission symptoms, develop shortness of breath, life threatening emergency, suicidal or homicidal thoughts you must seek medical attention immediately by calling 911 or calling your MD immediately  if symptoms less severe.  You Must read complete instructions/literature along with all the possible adverse reactions/side effects for all the Medicines you take and that have been prescribed to you. Take any new Medicines after you have completely understood and accept all the possible adverse reactions/side effects.   Please note  You were cared for by a hospitalist during your hospital stay. If you have any questions about your discharge medications or the care you received while you were in the hospital after you  are discharged, you can call the unit and asked to speak with the hospitalist on call if the hospitalist that took care of you is not available. Once you are discharged, your primary care physician will handle any further medical issues. Please note that NO REFILLS for any discharge medications will be  authorized once you are discharged, as it is imperative that you return to your primary care physician (or establish a relationship with a primary care physician if you do not have one) for your aftercare needs so that they can reassess your need for medications and monitor your lab values. Today   SUBJECTIVE   Pleasantly confused. Trying to eat BF  VITAL SIGNS:  Blood pressure 125/67, pulse 97, temperature 98.4 F (36.9 C), temperature source Oral, resp. rate 17, height 5\' 11"  (1.803 m), weight 65.8 kg, SpO2 92 %.  I/O:    Intake/Output Summary (Last 24 hours) at 05/24/2019 0925 Last data filed at 05/24/2019 0459 Gross per 24 hour  Intake 250 ml  Output 425 ml  Net -175 ml    PHYSICAL EXAMINATION:  GENERAL:  79 y.o.-year-old patient lying in the bed with no acute distress. Thin, cacehctic HEENT: Head atraumatic, normocephalic. Oropharynx and nasopharynx clear.  NECK:  Supple, no jugular venous distention. No thyroid enlargement, no tenderness.  LUNGS: coarse breath sounds bilaterally, no wheezing, rales,rhonchi or crepitation. No use of accessory muscles of respiration.  CARDIOVASCULAR: S1, S2 normal. No murmurs, rubs, or gallops.  ABDOMEN: Soft, non-tender, non-distended. Bowel sounds present. No organomegaly or mass.  EXTREMITIES: No pedal edema, cyanosis, or clubbing.  NEUROLOGIC: weak, grossly non focal, move all extremities well PSYCHIATRIC: The patient is alert and awake  SKIN: chronic stage II ulcer--as per RN document  DATA REVIEW:   CBC  Recent Labs  Lab 05/23/19 0617  WBC 5.7  HGB 7.6*  HCT 24.9*  PLT 185    Chemistries  Recent Labs  Lab 05/23/19 0617 05/23/19 0617 05/24/19 0338  NA 140  --   --   K 3.5  --   --   CL 108  --   --   CO2 26  --   --   GLUCOSE 86  --   --   BUN 14  --   --   CREATININE 0.45*  --   --   CALCIUM 7.5*  --   --   MG 1.9   < > 2.0   < > = values in this interval not displayed.    Microbiology Results   Recent Results  (from the past 240 hour(s))  Culture, blood (Routine X 2) w Reflex to ID Panel     Status: None (Preliminary result)   Collection Time: 05/21/19  5:27 PM   Specimen: BLOOD  Result Value Ref Range Status   Specimen Description BLOOD LEFT ANTECUBITAL  Final   Special Requests   Final    BOTTLES DRAWN AEROBIC AND ANAEROBIC Blood Culture adequate volume   Culture   Final    NO GROWTH 3 DAYS Performed at [facility], [address], [place], [place]    Report Status PENDING  Incomplete  Culture, blood (Routine X 2) w Reflex to ID Panel     Status: None (Preliminary result)   Collection Time: 05/21/19  5:32 PM   Specimen: BLOOD  Result Value Ref Range Status   Specimen Description BLOOD BLOOD LEFT HAND  Final   Special Requests   Final    BOTTLES DRAWN  AEROBIC AND ANAEROBIC Blood Culture adequate volume   Culture   Final    NO GROWTH 3 DAYS Performed at [facility], [address] [place], [place] [address]    Report Status PENDING  Incomplete  MRSA PCR Screening     Status: None   Collection Time: 05/22/19  2:42 PM   Specimen: Nasopharyngeal  Result Value Ref Range Status   MRSA by PCR NEGATIVE NEGATIVE Final    Comment:        The GeneXpert MRSA Assay (FDA approved for NASAL specimens only), is one component of a comprehensive MRSA colonization surveillance program. It is not intended to diagnose MRSA infection nor to guide or monitor treatment for MRSA infections. Performed at [facility], [address], [place], [place] [address]     RADIOLOGY:  No results found.   CODE STATUS:     Code Status Orders  (From admission, onward)         Start     Ordered   05/21/19 1333  Do not attempt resuscitation (DNR)  Continuous    Question Answer Comment  In the event of cardiac or respiratory ARREST Do not call a "code blue"   In the event of cardiac or respiratory ARREST Do not perform Intubation, CPR, defibrillation or ACLS    In the event of cardiac or respiratory ARREST Use medication by any route, position, wound care, and other measures to relive pain and suffering. May use oxygen, suction and manual treatment of airway obstruction as needed for comfort.   Comments Discussed code status with patient's daughter Donella Stade and he is a DNR      05/21/19 1337        Code Status History    Date Active Date Inactive Code Status Order ID Comments User Context   05/06/2019 1605 05/11/2019 2058 DNR [id number]  Spero Geralds, MD Inpatient   05/02/2019 2024 05/06/2019 1605 Full Code [id number]  Vianne Bulls, MD ED   Advance Care Planning Activity    Advance Directive Documentation     Most Recent Value  Type of Advance Directive  Healthcare Power of Attorney, Living will, Out of facility DNR (pink MOST or yellow form)  Pre-existing out of facility DNR order (yellow form or pink MOST form)  --  "MOST" Form in Place?  --       TOTAL TIME TAKING CARE OF THIS PATIENT: 40  minutes.    Fritzi Mandes M.D  [facility]    CC: Primary care physician; Hendricks Limes, MD

## 2019-05-24 NOTE — TOC Transition Note (Signed)
Transition of Care [facility]) - CM/SW Discharge Note   Patient Details  Name: Vonzell Lindblad MRN: [id number] Date of Birth: [birth year]  Transition of Care [facility]) CM/SW Contact:  Barrie Dunker, RN Phone Number: 05/24/2019, 10:30 AM   Clinical Narrative:    Patient to Discharge to [facility] room 201 today via EMS transport, The dc packet is on the chart and the bedside Nurse is aware, She is calling report to [facility], RNCM called [facility] to put the patient on the list to pick up     Barriers to Discharge: Continued Medical Work up   Patient Goals and CMS Choice Patient states their goals for this hospitalization and ongoing recovery are:: get well CMS Medicare.gov Compare Post Acute Care list provided to:: Patient Represenative (must comment)([name] Daughter) Choice offered to / list presented to : Adult Children  Discharge Placement                       Discharge Plan and Services     Post Acute Care Choice: Skilled Nursing Facility                               Social Determinants of Health (SDOH) Interventions     Readmission Risk Interventions Readmission Risk Prevention Plan 05/22/2019  Transportation Screening Complete  PCP or Specialist Appt within 3-5 Days Complete  HRI or Home Care Consult Complete  Social Work Consult for Recovery Care Planning/Counseling Complete  Palliative Care Screening Complete  Medication Review Oceanographer) Referral to Pharmacy

## 2019-05-26 LAB — CULTURE, BLOOD (ROUTINE X 2)
Culture: NO GROWTH
Culture: NO GROWTH
Special Requests: ADEQUATE
Special Requests: ADEQUATE

## 2019-05-28 ENCOUNTER — Other Ambulatory Visit: Payer: Self-pay

## 2019-05-28 ENCOUNTER — Non-Acute Institutional Stay: Payer: Medicare Other | Admitting: Internal Medicine

## 2019-05-28 VITALS — Wt 126.8 lb

## 2019-05-28 DIAGNOSIS — Z515 Encounter for palliative care: Secondary | ICD-10-CM

## 2019-05-28 DIAGNOSIS — Z7189 Other specified counseling: Secondary | ICD-10-CM

## 2019-05-28 NOTE — Progress Notes (Signed)
March 15th, 2021 [facility] Palliative Care Consult Note Telephone: [phone]  Fax: [phone]  PATIENT NAME: Paul Suarez DOB: 1940/05/26 MRN: [id number]  [address] [place]  [id number] ([name]) Joetta Manners (admit date 05/24/2019)  rm 201   PRIMARY CARE PROVIDER:   Pecola Lawless, MD  [address] [place],  [place] [address]   REFERRING PROVIDER:  Dr. Georgann Housekeeper  RESPONSIBLE PARTY: (dtr/HCPOA) [name] Earley Favor [phone]. (dtr) Claudie Revering [phone]    ASSESSMENT / RECOMMENDATIONS:  1. Advance Care Planning: A. Directives: Spoke with HCPOA/dtr. [name] wishes DNR in the event of a cardiopulmonary arrest. I completed this form and place in facility chart and upload to [facility] EMR. We discussed in brief aspects the sections of the MOST form. [name] wishes to have more information and discuss with her siblings.  -I'll mail her a copy of the MOST form along with some written educational material, and an envelope to mail form back to me. I'll place completed form at the facility, should [name] and her siblings wish to complete.   B. Goals of Care: [name] would like for patient to find comfort and happiness in his care at [facility], with anticipation of long-term placement there. She would like to be able to talk to her dad, when she comes for window visits.             -[name] plans to check with Sue Lush or with the facility SW allowable audio devices that [name] could use.   2. Cognitive / Functional status: Patient is A & O to self and place. He was unable to name his daughters. He had some repetitious questions/statements. He had an essential tremor bilateral UEs, worse with intention. He is soft spoken, speaking in short but complete sentences. He can make his needs known. Staff report he can be resistive to care. He is dependent for dressing and hygiene. He is incontinent of bowel and bladder. He has a L BKA, and is wheelchair bound. Staff  report he can pivot transfer with heavy assist.  He can feed himself. Current weight is 126.8lbs, which is a loss of 16.2lbs (11% of body weight) over the last 2 months. At a height of 5'11" his BMI is 17.7kg/m2. Patient is on chronic O2 currently at 4LPM.              I'll leave an order for requested nutritional supplements.  3. Family Supports: Patient was a previous resident at [facility] in [place], before recent hospital admissions and rehab. Patient is divorced many years. He is a retired Paediatric nurse. He has 3 daughters and one son. I spoke with daughter/HCPOA [name], with visit updates.   4. Follow up Palliative Care Visit: Within the next few months.  I spent 60 minutes providing this consultation from 4pm to 5pm. More than 50% of the time in this consultation was spent coordinating communication.   HISTORY OF PRESENT ILLNESS:  Paul Suarez is a 79 y.o. male with h/o CAD, COPD, depression, HTN, seizure disorder, staph bacteremia. -05/11/2019-05/20/2019 (after staphylococcal sepsis 2/2 pneumonia).  -3/8-3/01/2020 hospitalized with aspiration pneumonia (recurrent) thought r/t his GERD/regurg 2/2 large hiatal hernia. He has a lung abscess. Chronic COPD/emphysema (chronic O2). Recent COVID-19 infection. Palliative Care was asked to help address goals of care.   CODE STATUS: DNR  PPS: 30%  HOSPICE ELIGIBILITY/DIAGNOSIS: TBD  PAST MEDICAL HISTORY:      Past Medical History:  Diagnosis Date  . Anemia   . 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 CAD (coronary artery disease)    a. s/p LIMA-LAD in 11/2014  . Carotid artery occlusion   . CHF (congestive heart failure) (HCC)   . Complication of anesthesia    slow to awaken x 1 maybe 2001  . COPD (chronic obstructive pulmonary disease) (HCC)   . GERD (gastroesophageal reflux disease)    "sometimes" takes Catering manager or drinks gingerale   . Headache(784.0)   . History of kidney stones   . Hyperlipidemia   .  Hypertension   . Leg pain   . Peripheral vascular disease (HCC)   . Renal vascular disease 10/16/2014   bilateral stents placed   . Severe mitral regurgitation    a. 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 Shortness of breath dyspnea   . Tobacco abuse     SOCIAL HX:  Social History        Tobacco Use  . Smoking status: Current Every Day Smoker    Packs/day: 0.50    Years: 35.00    Pack years: 17.50    Types: Cigarettes  . Smokeless tobacco: Never Used  Substance Use Topics  . Alcohol use: Yes    Alcohol/week: 0.0 standard drinks    Comment: occasion    ALLERGIES:       Allergies  Allergen Reactions  . Lisinopril Swelling    Angioedema 05/31/11  . Chantix [Varenicline] Other (See Comments)    Caused insomnia  . Penicillins Hives    Did it involve swelling of the face/tongue/throat, SOB, or low BP? No Did it involve sudden or severe rash/hives, skin peeling, or any reaction on the inside of your mouth or nose? No Did you need to seek medical attention at a hospital or doctor's office? No When did it last happen?50 Years If all above answers are "NO", may proceed with cephalosporin use.       PERTINENT MEDICATIONS:      Outpatient Encounter Medications as of 05/28/2019  Medication Sig  . albuterol (PROVENTIL HFA;VENTOLIN HFA) 108 (90 Base) MCG/ACT inhaler Inhale 2 puffs into the lungs every 6 (six) hours as needed for wheezing or shortness of breath.  Marland Kitchen amiodarone (PACERONE) 200 MG tablet Take 1 tablet (200 mg total) by mouth 2 (two) times daily.  Marland Kitchen aspirin EC 81 MG tablet Take 1 tablet (81 mg total) by mouth daily.  . clopidogrel (PLAVIX) 75 MG tablet Take 1 tablet (75 mg total) by mouth daily.  . cyclobenzaprine (FLEXERIL) 10 MG tablet Take 1 tablet (10 mg total) by mouth 3 (three) times daily as needed for muscle spasms.  . feeding supplement, ENSURE ENLIVE, (ENSURE ENLIVE) LIQD Take 237 mLs by mouth 3 (three) times  daily between meals.  . fluticasone (FLOVENT HFA) 44 MCG/ACT inhaler Inhale 1 puff into the lungs daily.  . furosemide (LASIX) 40 MG tablet Take 1 tablet (40 mg total) by mouth daily.  Marland Kitchen gabapentin (NEURONTIN) 100 MG capsule Take 2 capsules (200 mg total) by mouth at bedtime.  Marland Kitchen Hydrocortisone (GERHARDT'S BUTT CREAM) CREA Apply 1 application topically daily. Apply to affected areas once daly  . isosorbide-hydrALAZINE (BIDIL) 20-37.5 MG tablet Take 1 tablet by mouth 3 (three) times daily.  . milrinone (PRIMACOR) 20 MG/100 ML SOLN infusion Inject 0.0159 mg/min into the vein continuous.  . Multiple Vitamin (MULTIVITAMIN WITH MINERALS) TABS tablet Take 1 tablet by mouth daily.  . polyethylene glycol (MIRALAX / GLYCOLAX) 17 g packet Take 17 g  by mouth daily as needed for mild constipation.  . senna (SENOKOT) 8.6 MG TABS tablet Take 1 tablet (8.6 mg total) by mouth daily.   No facility-administered encounter medications on file as of 05/28/2019.    PHYSICAL EXAM:   BP 100/60, HR 104, RR 28 General: NAD, frail appearing, thin. Upper extremity tremor.  Cardiovascular: regular rate and rhythm Pulmonary: scattered insp rhonchi referred from upper airway. Abdomen: soft, nontender, + bowel sounds GU: no suprapubic tenderness Extremities: no edema, no joint deformities. Left below the knee amputation.  Skin: no rashes Neurological: Weakness but otherwise non-focal Julianne Handler, NP

## 2019-05-30 ENCOUNTER — Encounter (HOSPITAL_COMMUNITY): Payer: Self-pay

## 2019-05-30 ENCOUNTER — Emergency Department (HOSPITAL_COMMUNITY): Payer: Medicare Other

## 2019-05-30 ENCOUNTER — Other Ambulatory Visit: Payer: Self-pay

## 2019-05-30 ENCOUNTER — Inpatient Hospital Stay (HOSPITAL_COMMUNITY)
Admission: EM | Admit: 2019-05-30 | Discharge: 2019-06-06 | DRG: 177 | Disposition: A | Discharge status: Hospice | Payer: Medicare Other | Source: Skilled Nursing Facility | Attending: Internal Medicine | Admitting: Internal Medicine

## 2019-05-30 DIAGNOSIS — T45525A Adverse effect of antithrombotic drugs, initial encounter: Secondary | ICD-10-CM | POA: Diagnosis present

## 2019-05-30 DIAGNOSIS — R627 Adult failure to thrive: Secondary | ICD-10-CM | POA: Diagnosis present

## 2019-05-30 DIAGNOSIS — L89151 Pressure ulcer of sacral region, stage 1: Secondary | ICD-10-CM | POA: Diagnosis present

## 2019-05-30 DIAGNOSIS — J852 Abscess of lung without pneumonia: Secondary | ICD-10-CM

## 2019-05-30 DIAGNOSIS — G40909 Epilepsy, unspecified, not intractable, without status epilepticus: Secondary | ICD-10-CM | POA: Diagnosis present

## 2019-05-30 DIAGNOSIS — J309 Allergic rhinitis, unspecified: Secondary | ICD-10-CM | POA: Diagnosis present

## 2019-05-30 DIAGNOSIS — I10 Essential (primary) hypertension: Secondary | ICD-10-CM | POA: Diagnosis present

## 2019-05-30 DIAGNOSIS — Z8616 Personal history of COVID-19: Secondary | ICD-10-CM | POA: Diagnosis not present

## 2019-05-30 DIAGNOSIS — E43 Unspecified severe protein-calorie malnutrition: Secondary | ICD-10-CM | POA: Diagnosis present

## 2019-05-30 DIAGNOSIS — F3340 Major depressive disorder, recurrent, in remission, unspecified: Secondary | ICD-10-CM | POA: Diagnosis not present

## 2019-05-30 DIAGNOSIS — J690 Pneumonitis due to inhalation of food and vomit: Secondary | ICD-10-CM | POA: Diagnosis present

## 2019-05-30 DIAGNOSIS — I959 Hypotension, unspecified: Secondary | ICD-10-CM | POA: Diagnosis present

## 2019-05-30 DIAGNOSIS — K219 Gastro-esophageal reflux disease without esophagitis: Secondary | ICD-10-CM | POA: Diagnosis present

## 2019-05-30 DIAGNOSIS — Z7902 Long term (current) use of antithrombotics/antiplatelets: Secondary | ICD-10-CM

## 2019-05-30 DIAGNOSIS — Z9981 Dependence on supplemental oxygen: Secondary | ICD-10-CM

## 2019-05-30 DIAGNOSIS — J432 Centrilobular emphysema: Secondary | ICD-10-CM | POA: Diagnosis present

## 2019-05-30 DIAGNOSIS — Z515 Encounter for palliative care: Secondary | ICD-10-CM | POA: Diagnosis not present

## 2019-05-30 DIAGNOSIS — J851 Abscess of lung with pneumonia: Secondary | ICD-10-CM | POA: Diagnosis present

## 2019-05-30 DIAGNOSIS — Z7189 Other specified counseling: Secondary | ICD-10-CM | POA: Diagnosis not present

## 2019-05-30 DIAGNOSIS — Z681 Body mass index (BMI) 19 or less, adult: Secondary | ICD-10-CM

## 2019-05-30 DIAGNOSIS — Z66 Do not resuscitate: Secondary | ICD-10-CM | POA: Diagnosis present

## 2019-05-30 DIAGNOSIS — Z87891 Personal history of nicotine dependence: Secondary | ICD-10-CM

## 2019-05-30 DIAGNOSIS — K449 Diaphragmatic hernia without obstruction or gangrene: Secondary | ICD-10-CM | POA: Diagnosis present

## 2019-05-30 DIAGNOSIS — R040 Epistaxis: Secondary | ICD-10-CM | POA: Diagnosis present

## 2019-05-30 DIAGNOSIS — Z79899 Other long term (current) drug therapy: Secondary | ICD-10-CM

## 2019-05-30 DIAGNOSIS — Z87440 Personal history of urinary (tract) infections: Secondary | ICD-10-CM

## 2019-05-30 DIAGNOSIS — L89610 Pressure ulcer of right heel, unstageable: Secondary | ICD-10-CM | POA: Diagnosis present

## 2019-05-30 DIAGNOSIS — D692 Other nonthrombocytopenic purpura: Secondary | ICD-10-CM | POA: Diagnosis present

## 2019-05-30 DIAGNOSIS — I2510 Atherosclerotic heart disease of native coronary artery without angina pectoris: Secondary | ICD-10-CM | POA: Diagnosis present

## 2019-05-30 DIAGNOSIS — R4189 Other symptoms and signs involving cognitive functions and awareness: Secondary | ICD-10-CM | POA: Diagnosis present

## 2019-05-30 DIAGNOSIS — L8990 Pressure ulcer of unspecified site, unspecified stage: Secondary | ICD-10-CM | POA: Diagnosis present

## 2019-05-30 DIAGNOSIS — Z8701 Personal history of pneumonia (recurrent): Secondary | ICD-10-CM

## 2019-05-30 DIAGNOSIS — D62 Acute posthemorrhagic anemia: Secondary | ICD-10-CM | POA: Diagnosis present

## 2019-05-30 DIAGNOSIS — J9621 Acute and chronic respiratory failure with hypoxia: Secondary | ICD-10-CM | POA: Diagnosis present

## 2019-05-30 DIAGNOSIS — R64 Cachexia: Secondary | ICD-10-CM | POA: Diagnosis present

## 2019-05-30 DIAGNOSIS — Z89512 Acquired absence of left leg below knee: Secondary | ICD-10-CM

## 2019-05-30 DIAGNOSIS — L89212 Pressure ulcer of right hip, stage 2: Secondary | ICD-10-CM | POA: Diagnosis present

## 2019-05-30 DIAGNOSIS — J449 Chronic obstructive pulmonary disease, unspecified: Secondary | ICD-10-CM | POA: Diagnosis not present

## 2019-05-30 DIAGNOSIS — F339 Major depressive disorder, recurrent, unspecified: Secondary | ICD-10-CM | POA: Diagnosis present

## 2019-05-30 DIAGNOSIS — D638 Anemia in other chronic diseases classified elsewhere: Secondary | ICD-10-CM | POA: Diagnosis present

## 2019-05-30 DIAGNOSIS — R0602 Shortness of breath: Secondary | ICD-10-CM | POA: Diagnosis not present

## 2019-05-30 DIAGNOSIS — R4182 Altered mental status, unspecified: Secondary | ICD-10-CM | POA: Diagnosis present

## 2019-05-30 LAB — CBC WITH DIFFERENTIAL/PLATELET
Abs Immature Granulocytes: 0.06 10*3/uL (ref 0.00–0.07)
Basophils Absolute: 0.1 10*3/uL (ref 0.0–0.1)
Basophils Relative: 1 %
Eosinophils Absolute: 0.1 10*3/uL (ref 0.0–0.5)
Eosinophils Relative: 1 %
HCT: 30.3 % — ABNORMAL LOW (ref 39.0–52.0)
Hemoglobin: 9.3 g/dL — ABNORMAL LOW (ref 13.0–17.0)
Immature Granulocytes: 1 %
Lymphocytes Relative: 17 %
Lymphs Abs: 1.5 10*3/uL (ref 0.7–4.0)
MCH: 30.2 pg (ref 26.0–34.0)
MCHC: 30.7 g/dL (ref 30.0–36.0)
MCV: 98.4 fL (ref 80.0–100.0)
Monocytes Absolute: 1.4 10*3/uL — ABNORMAL HIGH (ref 0.1–1.0)
Monocytes Relative: 15 %
Neutro Abs: 6.2 10*3/uL (ref 1.7–7.7)
Neutrophils Relative %: 65 %
Platelets: 424 10*3/uL — ABNORMAL HIGH (ref 150–400)
RBC: 3.08 MIL/uL — ABNORMAL LOW (ref 4.22–5.81)
RDW: 15.8 % — ABNORMAL HIGH (ref 11.5–15.5)
WBC: 9.3 10*3/uL (ref 4.0–10.5)
nRBC: 0 % (ref 0.0–0.2)

## 2019-05-30 LAB — PROTIME-INR
INR: 1.1 (ref 0.8–1.2)
Prothrombin Time: 14.4 seconds (ref 11.4–15.2)

## 2019-05-30 LAB — MAGNESIUM: Magnesium: 2 mg/dL (ref 1.7–2.4)

## 2019-05-30 LAB — BLOOD GAS, VENOUS
Acid-base deficit: 0.4 mmol/L (ref 0.0–2.0)
Bicarbonate: 25 mmol/L (ref 20.0–28.0)
O2 Saturation: 79.6 %
Patient temperature: 98.6
pCO2, Ven: 48.2 mmHg (ref 44.0–60.0)
pH, Ven: 7.336 (ref 7.250–7.430)
pO2, Ven: 53.4 mmHg — ABNORMAL HIGH (ref 32.0–45.0)

## 2019-05-30 LAB — BASIC METABOLIC PANEL
Anion gap: 8 (ref 5–15)
BUN: 27 mg/dL — ABNORMAL HIGH (ref 8–23)
CO2: 25 mmol/L (ref 22–32)
Calcium: 8.4 mg/dL — ABNORMAL LOW (ref 8.9–10.3)
Chloride: 104 mmol/L (ref 98–111)
Creatinine, Ser: 0.66 mg/dL (ref 0.61–1.24)
GFR calc Af Amer: >60 mL/min (ref >60)
GFR calc non Af Amer: >60 mL/min (ref >60)
Glucose, Bld: 132 mg/dL — ABNORMAL HIGH (ref 70–99)
Potassium: 4.2 mmol/L (ref 3.5–5.1)
Sodium: 137 mmol/L (ref 135–145)

## 2019-05-30 LAB — HEMOGLOBIN AND HEMATOCRIT, BLOOD
HCT: 27.4 % — ABNORMAL LOW (ref 39.0–52.0)
Hemoglobin: 8.5 g/dL — ABNORMAL LOW (ref 13.0–17.0)

## 2019-05-30 LAB — PREPARE RBC (CROSSMATCH)

## 2019-05-30 LAB — APTT: aPTT: 27 seconds (ref 24–36)

## 2019-05-30 MED ORDER — DIVALPROEX SODIUM ER 500 MG PO TB24
500.0000 mg | ORAL_TABLET | Freq: Every day | ORAL | Status: DC
  Administered 2019-05-31 – 2019-06-05 (×7): 500 mg via ORAL
  Filled 2019-05-30: qty 1
  Filled 2019-05-30: qty 2
  Filled 2019-05-30 (×3): qty 1
  Filled 2019-05-30: qty 2
  Filled 2019-05-30: qty 1

## 2019-05-30 MED ORDER — SODIUM CHLORIDE 0.9% FLUSH
3.0000 mL | Freq: Two times a day (BID) | INTRAVENOUS | Status: DC
  Administered 2019-06-01 – 2019-06-06 (×8): 3 mL via INTRAVENOUS

## 2019-05-30 MED ORDER — AMOXICILLIN-POT CLAVULANATE 400-57 MG/5ML PO SUSR
875.0000 mg | Freq: Two times a day (BID) | ORAL | Status: DC
  Administered 2019-05-31 – 2019-06-01 (×4): 875 mg via ORAL
  Filled 2019-05-30 (×9): qty 10.9

## 2019-05-30 MED ORDER — MORPHINE SULFATE (PF) 2 MG/ML IV SOLN
2.0000 mg | Freq: Once | INTRAVENOUS | Status: AC
  Administered 2019-05-30: 2 mg via INTRAVENOUS
  Filled 2019-05-30: qty 1

## 2019-05-30 MED ORDER — ADULT MULTIVITAMIN W/MINERALS CH
1.0000 | ORAL_TABLET | Freq: Every day | ORAL | Status: DC
  Administered 2019-06-01 – 2019-06-06 (×6): 1 via ORAL
  Filled 2019-05-30 (×8): qty 1

## 2019-05-30 MED ORDER — ALBUTEROL SULFATE (2.5 MG/3ML) 0.083% IN NEBU
2.5000 mg | INHALATION_SOLUTION | Freq: Four times a day (QID) | RESPIRATORY_TRACT | Status: DC | PRN
  Administered 2019-06-02: 2.5 mg via RESPIRATORY_TRACT

## 2019-05-30 MED ORDER — PANTOPRAZOLE SODIUM 40 MG PO TBEC
40.0000 mg | DELAYED_RELEASE_TABLET | Freq: Every day | ORAL | Status: DC
  Administered 2019-05-31 – 2019-06-06 (×7): 40 mg via ORAL
  Filled 2019-05-30 (×8): qty 1

## 2019-05-30 MED ORDER — SERTRALINE HCL 25 MG PO TABS
25.0000 mg | ORAL_TABLET | Freq: Every day | ORAL | Status: DC
  Administered 2019-05-31 – 2019-06-06 (×7): 25 mg via ORAL
  Filled 2019-05-30 (×3): qty 1
  Filled 2019-05-30: qty 0.5
  Filled 2019-05-30 (×4): qty 1

## 2019-05-30 MED ORDER — SODIUM CHLORIDE 0.9 % IV SOLN: INTRAVENOUS | Status: DC

## 2019-05-30 MED ORDER — ACETAMINOPHEN 650 MG RE SUPP: 650.0000 mg | Freq: Four times a day (QID) | RECTAL | Status: DC | PRN

## 2019-05-30 MED ORDER — SULFAMETHOXAZOLE-TRIMETHOPRIM 200-40 MG/5ML PO SUSP
20.0000 mL | Freq: Two times a day (BID) | ORAL | Status: DC
  Administered 2019-05-31 – 2019-06-01 (×4): 20 mL via ORAL
  Filled 2019-05-30 (×8): qty 20

## 2019-05-30 MED ORDER — MELATONIN 3 MG PO TABS
3.0000 mg | ORAL_TABLET | Freq: Every evening | ORAL | Status: DC | PRN
  Administered 2019-05-31 – 2019-06-01 (×2): 3 mg via ORAL
  Filled 2019-05-30 (×2): qty 1

## 2019-05-30 MED ORDER — TIOTROPIUM BROMIDE MONOHYDRATE 18 MCG IN CAPS: 18.0000 ug | ORAL_CAPSULE | Freq: Every day | RESPIRATORY_TRACT | Status: DC

## 2019-05-30 MED ORDER — ASCORBIC ACID 500 MG PO TABS
500.0000 mg | ORAL_TABLET | Freq: Every day | ORAL | Status: DC
  Administered 2019-06-01 – 2019-06-06 (×6): 500 mg via ORAL
  Filled 2019-05-30 (×8): qty 1

## 2019-05-30 MED ORDER — SODIUM CHLORIDE 0.9% IV SOLUTION: Freq: Once | INTRAVENOUS | Status: AC

## 2019-05-30 MED ORDER — ONDANSETRON HCL 4 MG/2ML IJ SOLN
4.0000 mg | Freq: Four times a day (QID) | INTRAMUSCULAR | Status: DC | PRN
  Administered 2019-05-30: 4 mg via INTRAVENOUS
  Filled 2019-05-30: qty 2

## 2019-05-30 MED ORDER — ACETAMINOPHEN 325 MG PO TABS
650.0000 mg | ORAL_TABLET | Freq: Four times a day (QID) | ORAL | Status: DC | PRN
  Administered 2019-06-05: 650 mg via ORAL
  Filled 2019-05-30: qty 2

## 2019-05-30 MED ORDER — FENTANYL CITRATE (PF) 100 MCG/2ML IJ SOLN: 25.0000 ug | INTRAMUSCULAR | Status: DC | PRN

## 2019-05-30 MED ORDER — LORATADINE 10 MG PO TABS
10.0000 mg | ORAL_TABLET | Freq: Every day | ORAL | Status: DC
  Administered 2019-05-31 – 2019-06-06 (×7): 10 mg via ORAL
  Filled 2019-05-30 (×8): qty 1

## 2019-05-30 NOTE — ED Triage Notes (Signed)
Pt BIBA from [facility]. Pt has hx of aspiration in the past. Today, he had a nosebleed in the right nostril for approximately 15 minutes. Bleeding was unable to be controlled by afrin, was controlled with tampon. We placed quick clot upon ED arrival.  Pt's lung sounds a gurgling and very poor per EMS. Left side sounds are worse than right. Rales noted by Molly Maduro EMT.  Pt was originally put on 8L [place], satting in the 70s. Pt on NRB on arrival to ED, satting mostly in the mid 80s.

## 2019-05-30 NOTE — ED Provider Notes (Signed)
[facility]-EMERGENCY DEPT Provider Note   CSN: [id number] Arrival date & time: 05/30/19  1621     History Chief Complaint  Patient presents with  . Epistaxis  . Aspiration    Paul Suarez is a 79 y.o. male.  Patient is a 79 year old male with a history of COPD on chronic oxygen therapy, hypertension, CAD, aspiration pneumonia with seizure disorder who is presenting from a nursing facility today for persistent nosebleed.  They report for the last 30 minutes he has had a nosebleed in the right nare with oxygen desaturation.  EMS reported oxygen saturation of 70% on their arrival with respiratory symptoms and concern for aspiration.  Patient denies any pain but states he feels short of breath on exam.  He states he feels that there is still blood running down the back of his throat.  He is having no bleeding from the left nare.  Currently he has a gauze that was soaked and quick clot that is present in his right nare.    The history is provided by the EMS personnel.  Epistaxis Location:  R nare Severity:  Severe Duration:  30 minutes Timing:  Constant Progression:  Resolved Chronicity:  Recurrent Context: home oxygen   Context: not anticoagulants and not aspirin use        Past Medical History:  Diagnosis Date  . CAD (coronary artery disease)   . COPD (chronic obstructive pulmonary disease) (HCC)   . Depression   . Essential hypertension   . Seizure disorder (HCC)   . Staphylococcus aureus bacteremia 05/06/2019    Patient Active Problem List   Diagnosis Date Noted  . Abscess of lung with pneumonia (HCC)   . Aspiration pneumonia (HCC) 05/21/2019  . Anemia of chronic disease 05/21/2019  . Protein-calorie malnutrition, severe 05/10/2019  . Pressure injury of skin 05/08/2019  . Staphylococcus aureus bacteremia 05/06/2019  . Severe sepsis (HCC) 05/02/2019  . Acute on chronic respiratory failure with hypoxia (HCC) 05/02/2019  . AKI (acute kidney injury)  (HCC) 05/02/2019  . Acute blood loss anemia 05/02/2019  . Tachycardia 04/26/2019  . AMS (altered mental status) 02/13/2019  . UTI due to extended-spectrum beta lactamase (ESBL) producing Escherichia coli 02/12/2019  . CAD (coronary artery disease) 02/12/2019  . Essential hypertension 02/12/2019  . Vitamin D deficiency 02/12/2019  . Seasonal allergies 02/12/2019  . Muscle spasm/History of left BKA 02/12/2019  . Insomnia 02/12/2019  . COPD with acute exacerbation (HCC) 02/12/2019  . Major depression, recurrent (HCC) 02/12/2019  . Hypokalemia 02/12/2019  . Constipation 02/12/2019  . Seizure disorder (HCC) 02/12/2019    Past Surgical History:  Procedure Laterality Date  . BELOW KNEE LEG AMPUTATION Left        History reviewed. No pertinent family history.  Social History   Tobacco Use  . Smoking status: Former Smoker    Years: 20.00  . Smokeless tobacco: Never Used  Substance Use Topics  . Alcohol use: Not Currently  . Drug use: Never    Home Medications Prior to Admission medications   Medication Sig Start Date End Date Taking? Authorizing Provider  albuterol (VENTOLIN HFA) 108 (90 Base) MCG/ACT inhaler Inhale 2 puffs into the lungs every 6 (six) hours as needed for shortness of breath (FOR Shortness of Breath  (S.O.B.)).    [provider]  amoxicillin-clavulanate (AUGMENTIN) 400-57 MG/5ML suspension Take 10.9 mLs (875 mg total) by mouth every 12 (twelve) hours for 21 days. 05/24/19 06/14/19  Enedina Finner, MD  cholecalciferol (VITAMIN  D) 25 MCG (1000 UT) tablet Take 1 tablet (1,000 Units total) by mouth daily. 02/12/19   Medina-Vargas, Monina C, NP  clopidogrel (PLAVIX) 75 MG tablet Take 1 tablet (75 mg total) by mouth daily. 02/12/19   Medina-Vargas, Monina C, NP  divalproex (DEPAKOTE ER) 500 MG 24 hr tablet Take 1 tablet (500 mg total) by mouth at bedtime. 02/12/19   Medina-Vargas, Monina C, NP  docusate sodium (COLACE) 100 MG capsule Take 1 capsule (100 mg total)  by mouth at bedtime. 02/12/19   Medina-Vargas, Monina C, NP  guaiFENesin (ROBITUSSIN) 100 MG/5ML liquid Take 200 mg by mouth 4 (four) times daily as needed for cough.    [provider]  Infant Care Products (DERMACLOUD) CREA Apply topically. Every shift    [provider]  ipratropium-albuterol (DUONEB) 0.5-2.5 (3) MG/3ML SOLN Take 3 mLs by nebulization 4 (four) times daily. For SOB and Wheezing 04/28/19 05/01/19  [provider]  loratadine (CLARITIN) 10 MG tablet Take 1 tablet (10 mg total) by mouth daily. 02/12/19   Medina-Vargas, Monina C, NP  Melatonin 3 MG TABS Take 3 mg by mouth at bedtime as needed (sleep).     [provider]  Multiple Vitamin (MULTIVITAMIN WITH MINERALS) TABS tablet Take 1 tablet by mouth daily. 05/11/19   Kathlen Mody, MD  Nutritional Supplement LIQD Take 120 mLs by mouth 2 (two) times daily. MedPass    [provider]  Nutritional Supplements (NUTRITIONAL SUPPLEMENT PO) Take 1 each by mouth daily. Magic Cup    [provider]  pantoprazole (PROTONIX) 40 MG tablet Take 1 tablet (40 mg total) by mouth daily. 05/24/19   Enedina Finner, MD  polyethylene glycol powder (GLYCOLAX/MIRALAX) 17 GM/SCOOP powder Take 17 g by mouth daily. 02/12/19   Medina-Vargas, Monina C, NP  sertraline (ZOLOFT) 50 MG tablet Take 25 mg by mouth daily.  04/27/19   [provider]  sulfamethoxazole-trimethoprim (BACTRIM) 200-40 MG/5ML suspension Take 20 mLs by mouth every 12 (twelve) hours for 21 days. 05/24/19 06/14/19  Enedina Finner, MD  tiotropium (SPIRIVA HANDIHALER) 18 MCG inhalation capsule Place 1 capsule (18 mcg total) into inhaler and inhale daily. 02/12/19   Medina-Vargas, Monina C, NP  vitamin C (ASCORBIC ACID) 500 MG tablet Take 1 tablet (500 mg total) by mouth daily. 02/12/19   Medina-Vargas, Monina C, NP  zinc sulfate 220 (50 Zn) MG capsule Take 220 mg by mouth daily.  04/19/19   [provider]    Allergies    Patient has no  known allergies.  Review of Systems   Review of Systems  Unable to perform ROS: Acuity of condition  HENT: Positive for nosebleeds.     Physical Exam Updated Vital Signs BP 115/69   Pulse (!) 131   Temp 98 F (36.7 C) (Axillary)   Resp (!) 26   SpO2 91%   Physical Exam Vitals and nursing note reviewed.  Constitutional:      General: He is not in acute distress.    Appearance: He is well-developed.  HENT:     Head: Normocephalic and atraumatic.     Comments: Blood in the posterior pharynx, gauze in the right nare without active bleeding.  Left nare wnl. Eyes:     Conjunctiva/sclera: Conjunctivae normal.     Pupils: Pupils are equal, round, and reactive to light.  Cardiovascular:     Rate and Rhythm: Regular rhythm. Tachycardia present.     Heart sounds: No murmur.  Pulmonary:  Effort: Pulmonary effort is normal. No respiratory distress.     Breath sounds: Rhonchi present. No wheezing or rales.     Comments: Rhonchi present bilaterally more significant in the right mid and lower lobe Abdominal:     General: There is no distension.     Palpations: Abdomen is soft.     Tenderness: There is no abdominal tenderness. There is no guarding or rebound.  Musculoskeletal:        General: No tenderness. Normal range of motion.     Cervical back: Normal range of motion and neck supple.  Skin:    General: Skin is warm and dry.     Findings: No erythema or rash.  Neurological:     General: No focal deficit present.     Mental Status: He is alert and oriented to person, place, and time. Mental status is at baseline.  Psychiatric:        Mood and Affect: Mood normal.        Behavior: Behavior normal.        Thought Content: Thought content normal.     ED Results / Procedures / Treatments   Labs (all labs ordered are listed, but only abnormal results are displayed) Labs Reviewed  CBC WITH DIFFERENTIAL/PLATELET - Abnormal; Notable for the following components:      Result  Value   RBC 3.08 (*)    Hemoglobin 9.3 (*)    HCT 30.3 (*)    RDW 15.8 (*)    Platelets 424 (*)    Monocytes Absolute 1.4 (*)    All other components within normal limits  BASIC METABOLIC PANEL - Abnormal; Notable for the following components:   Glucose, Bld 132 (*)    BUN 27 (*)    Calcium 8.4 (*)    All other components within normal limits  BLOOD GAS, VENOUS - Abnormal; Notable for the following components:   pO2, Ven 53.4 (*)    All other components within normal limits  PROTIME-INR  APTT    EKG EKG Interpretation  Date/Time:  Wednesday May 30 2019 16:33:53 EDT Ventricular Rate:  130 PR Interval:    QRS Duration: 86 QT Interval:  296 QTC Calculation: 436 R Axis:   36 Text Interpretation: Sinus tachycardia Artifact Confirmed by Blanchie Dessert [phone]) on 05/30/2019 4:49:27 PM   Radiology DG Chest Portable 1 View  Result Date: 05/30/2019 CLINICAL DATA:  Aspiration EXAM: PORTABLE CHEST 1 VIEW COMPARISON:  May 21, 2019 FINDINGS: Airspace opacities are again noted in the right mid lung zone and left lower lung zone. These are essentially unchanged from prior chest x-ray. There is no pneumothorax. No large pleural effusion. The heart size is stable. IMPRESSION: Again noted are findings of aspiration pneumonia, minimally improved from prior study. Electronically Signed   By: Constance Holster M.D.   On: 05/30/2019 17:44    Procedures .Epistaxis Management  Date/Time: 05/30/2019 6:14 PM Performed by: Blanchie Dessert, MD Authorized by: Blanchie Dessert, MD   Consent:    Consent obtained:  Emergent situation   Consent given by:  Patient   Risks discussed:  Bleeding and pain   Alternatives discussed:  No treatment Anesthesia (see MAR for exact dosages):    Anesthesia method:  None Procedure details:    Treatment site:  R anterior and R posterior   Treatment method:  Nasal balloon   Treatment complexity:  Limited   Treatment episode: initial   Post-procedure  details:    Assessment:  Bleeding decreased  Patient tolerance of procedure:  Tolerated well, no immediate complications   (including critical care time)  Medications Ordered in ED Medications - No data to display  ED Course  I have reviewed the triage vital signs and the nursing notes.  Pertinent labs & imaging results that were available during my care of the patient were reviewed by me and considered in my medical decision making (see chart for details).    MDM Rules/Calculators/A&P                      Patient is an elderly male presenting today with right-sided nosebleed.  Patient with concern for aspiration as he was initially desatting in the 70s and requiring a nonrebreather.  Patient on a nonrebreather is satting 94 to 95%.  He is tachycardic in the 130s but appears to be sinus tachycardia.  He does take plavix.  There is no bleeding coming from the nare but concerned that he may still have deep anterior bleeding still going down the posterior pharynx.  No bleeding coming from the left nare.  Will place [employer] and continue to monitor.  Labs and imaging pending.  6:03 PM CBC without acute changes, BMP without acute changes, blood gas is currently normal, PT and INR/PTT normal.  Chest x-ray again notes aspiration pneumonia that is minimally improved.  On repeat evaluation patient is now having some bleeding from the right nare and a 7.5 mm Rhino Rocket was placed in the right nare.  Patient continues to require a nonrebreather and is still tachypneic and tachycardic.  Oxygen saturation of 91%.  Looking at patient's prior CODE STATUS he was a DNR DNI based on discussions in February of this year.  We will continue nonrebreather at this time but feel he will need ICU admission due to work of breathing.  CRITICAL CARE Performed by: Aslee Such Total critical care time: 30 minutes Critical care time was exclusive of separately billable procedures and treating other  patients. Critical care was necessary to treat or prevent imminent or life-threatening deterioration. Critical care was time spent personally by me on the following activities: development of treatment plan with patient and/or surrogate as well as nursing, discussions with consultants, evaluation of patient's response to treatment, examination of patient, obtaining history from patient or surrogate, ordering and performing treatments and interventions, ordering and review of laboratory studies, ordering and review of radiographic studies, pulse oximetry and re-evaluation of patient's condition.   Final Clinical Impression(s) / ED Diagnoses Final diagnoses:  Epistaxis  Acute on chronic respiratory failure with hypoxia [facility])    Rx / DC Orders ED Discharge Orders    None       Gwyneth Sprout, MD 05/30/19 1842

## 2019-05-30 NOTE — Progress Notes (Signed)
Called earlier by RN- discussed the possibility of placing PT on high flow nasal cannula. RT arrived in PT room with intentions of placing PT on [place] high flow nasal cannula. MD, Anitra Lauth stated that it was reported to her that PT nose began to bleed again. RT and MD agreed to leave PT on existing non rebreather (Sp02 95%)- MD placed nasal type balloon in PT right nare.

## 2019-05-30 NOTE — ED Notes (Signed)
Per MD, O2 goal is >90%.

## 2019-05-30 NOTE — H&P (Signed)
History and Physical    PLEASE NOTE THAT DRAGON DICTATION SOFTWARE WAS USED IN THE CONSTRUCTION OF THIS NOTE.   Paul Suarez DQQ:[id number] DOB: [birth year] DOA: 05/30/2019  PCP: Hendricks Limes, MD Patient coming from: SNF  I have personally briefly reviewed patient's old medical records in [place]  Chief Complaint: nose bleed  HPI: Paul Suarez is a 79 y.o. male with medical history significant for chronic hypoxic respiratory failure on 4 L continuous nasal cannula in setting of severe COPD, seizure disorder, anemia of chronic disease with baseline hemoglobin 7.5-9.0, and recent aspiration pneumonia, who is admitted to [facility] on 05/30/2019 with acute on chronic hypoxic resp failure after presenting from SNF to [facility] Emergency Department complaining of epistaxis of the right nare.   The following history is obtained via my discussions with the patient, my discussions with the patient's daughter who was present at bedside, my discussions with the ED physician, and via chart review.   Patient hospitalized twice over the last month, initially for COVID-19 pneumonia in February 2021, associated with positive COVID-19 PCR result on 05/02/19 as well as subsequent hospitalization from 05/21/19 to 05/24/19 for aspiration pneumonia in the setting of presenting epistaxis. The patient was discharged on a three week course of both Augmentin and Bactrim.    Today the patient reports that he developed a right-sided nose bleed this morning, which prompted SNF staff to contact EMS, who subsequently brought patient to [facility] ED for further evaluation of epistaxis. Patient noted slight increase in non-productive cough following onset of epistaxis this morning, but denies any associated significant shortness of breath nor any wheezing. Denies any associated CP, diaphoresis, dizziness, presyncope, or syncope. The patient report that he does not believe he is chronic 4 L/min supplemental O2  via [place] is associated with humidifcation at the SNF. The patient and his daughter report that the patient has been on Plavix for several years, although they are unsure as to the indication of this medication. Deny any history of stroke, PVD, or cardiac stent placement. Otherwise, he is not on any blood thinners as an outpatient. Denies any recent trauma.   Denies any subjective fever, chills, rigors, or generalized myalgias. Denies any recent headache, neck stiffness, nausea, vomiting, abdominal pain, diarrhea, or rash. Denies dysuria, gross hematuria, or change in urinary urgency/frequency.    ED Course:  Vital signs in the ED were notable for the following: Tmax 98; HR 115-133; BP 103/75 - 120/71; RR 18-34; initial O2 sat on chronic baseline 4 L [place] noted to be 85%; subsequently O2 improved to 100% on 10 L non-rebreather, which has been weaned to 6 L, with ensuing O2 sat of 96%.  Labs were notable for the following: VBG on 6L non-rebreather: 7.336/48.2; BMP notable for BUN 27 relative to 14 on 05/23/19, creatinine 0.66 compared to 0.45 on 05/23/19; INR 1.1; CBC notable for wbc of 9300, hgb 9.3 relative to 7.6 on 05/23/19, rdw of 15.8 compared to 17.9 on 05/23/19, and platelets 424.   CXR, by comparison to CXR from 05/21/19 shows interval improving airspace opacities in the right middle lung as well left lower lung, in the absence of any evidence of pleural effusion or pneumothorax.   ED physician packed right nare in setting of presenting right-sided epistaxis. Case was discussed with the on-call pulm-critical care physician, Dr. Kyung Rudd, who recommended admission to hospitalist service to the step-down unit. Dr. Kyung Rudd also recommended against use of high flow nasal canula and BiPAP/CPAP  in the setting of presenting epistaxis.     Review of Systems: As per HPI otherwise 10 point review of systems negative.   Past Medical History:  Diagnosis Date  . CAD (coronary artery disease)   . COPD (chronic  obstructive pulmonary disease) (HCC)   . Depression   . Essential hypertension   . Seizure disorder (HCC)   . Staphylococcus aureus bacteremia 05/06/2019    Past Surgical History:  Procedure Laterality Date  . BELOW KNEE LEG AMPUTATION Left     Social History:  reports that he has quit smoking. He quit after 20.00 years of use. He has never used smokeless tobacco. He reports previous alcohol use. He reports that he does not use drugs.   No Known Allergies  History reviewed. No pertinent family history.   Prior to Admission medications   Medication Sig Start Date End Date Taking? Authorizing Provider  albuterol (VENTOLIN HFA) 108 (90 Base) MCG/ACT inhaler Inhale 2 puffs into the lungs every 6 (six) hours as needed for shortness of breath (FOR Shortness of Breath  (S.O.B.)).    [provider]  amoxicillin-clavulanate (AUGMENTIN) 400-57 MG/5ML suspension Take 10.9 mLs (875 mg total) by mouth every 12 (twelve) hours for 21 days. 05/24/19 06/14/19  Enedina Finner, MD  cholecalciferol (VITAMIN D) 25 MCG (1000 UT) tablet Take 1 tablet (1,000 Units total) by mouth daily. 02/12/19   Medina-Vargas, Monina C, NP  clopidogrel (PLAVIX) 75 MG tablet Take 1 tablet (75 mg total) by mouth daily. 02/12/19   Medina-Vargas, Monina C, NP  divalproex (DEPAKOTE ER) 500 MG 24 hr tablet Take 1 tablet (500 mg total) by mouth at bedtime. 02/12/19   Medina-Vargas, Monina C, NP  docusate sodium (COLACE) 100 MG capsule Take 1 capsule (100 mg total) by mouth at bedtime. 02/12/19   Medina-Vargas, Monina C, NP  guaiFENesin (ROBITUSSIN) 100 MG/5ML liquid Take 200 mg by mouth 4 (four) times daily as needed for cough.    [provider]  Infant Care Products (DERMACLOUD) CREA Apply topically. Every shift    [provider]  ipratropium-albuterol (DUONEB) 0.5-2.5 (3) MG/3ML SOLN Take 3 mLs by nebulization 4 (four) times daily. For SOB and Wheezing 04/28/19 05/01/19  [provider]  loratadine  (CLARITIN) 10 MG tablet Take 1 tablet (10 mg total) by mouth daily. 02/12/19   Medina-Vargas, Monina C, NP  Melatonin 3 MG TABS Take 3 mg by mouth at bedtime as needed (sleep).     [provider]  Multiple Vitamin (MULTIVITAMIN WITH MINERALS) TABS tablet Take 1 tablet by mouth daily. 05/11/19   Kathlen Mody, MD  Nutritional Supplement LIQD Take 120 mLs by mouth 2 (two) times daily. MedPass    [provider]  Nutritional Supplements (NUTRITIONAL SUPPLEMENT PO) Take 1 each by mouth daily. Magic Cup    [provider]  pantoprazole (PROTONIX) 40 MG tablet Take 1 tablet (40 mg total) by mouth daily. 05/24/19   Enedina Finner, MD  polyethylene glycol powder (GLYCOLAX/MIRALAX) 17 GM/SCOOP powder Take 17 g by mouth daily. 02/12/19   Medina-Vargas, Monina C, NP  sertraline (ZOLOFT) 50 MG tablet Take 25 mg by mouth daily.  04/27/19   [provider]  sulfamethoxazole-trimethoprim (BACTRIM) 200-40 MG/5ML suspension Take 20 mLs by mouth every 12 (twelve) hours for 21 days. 05/24/19 06/14/19  Enedina Finner, MD  tiotropium (SPIRIVA HANDIHALER) 18 MCG inhalation capsule Place 1 capsule (18 mcg total) into inhaler and inhale daily. 02/12/19   Medina-Vargas, Margit Banda, NP  vitamin  C (ASCORBIC ACID) 500 MG tablet Take 1 tablet (500 mg total) by mouth daily. 02/12/19   Medina-Vargas, Monina C, NP  zinc sulfate 220 (50 Zn) MG capsule Take 220 mg by mouth daily.  04/19/19   [provider]     Objective    Physical Exam: Vitals:   05/30/19 1730 05/30/19 1745 05/30/19 1800 05/30/19 1815  BP: 124/67 133/74 135/75 118/62  Pulse: (!) 133 (!) 133 (!) 135 (!) 131  Resp: (!) 37 (!) 28 (!) 24 (!) 22  Temp:      TempSrc:      SpO2: 93% 96% 90% 97%    General: appears to be stated age; alert, oriented Skin: warm, dry, no rash Head:  AT/[place]; packing noted in right nares. Eyes:  PEARL b/l, EOMI Mouth:  Oral mucosa membranes appear dry, normal dentition Neck: supple; trachea  midline Heart:  Mildly tachycardic, but regular; did not appreciate any M/R/G Lungs: CTAB, did not appreciate any wheezes, rales, or rhonchi Abdomen: + BS; soft, ND, NT Vascular: 2+ pedal pulses b/l; 2+ radial pulses b/l Extremities: no peripheral edema, no muscle wasting; L BKA noted.   Labs on Admission: I have personally reviewed following labs and imaging studies  CBC: Recent Labs  Lab 05/30/19 1718  WBC 9.3  NEUTROABS 6.2  HGB 9.3*  HCT 30.3*  MCV 98.4  PLT 424*   Basic Metabolic Panel: Recent Labs  Lab 05/24/19 0338 05/30/19 1718  NA  --  137  K  --  4.2  CL  --  104  CO2  --  25  GLUCOSE  --  132*  BUN  --  27*  CREATININE  --  0.66  CALCIUM  --  8.4*  MG 2.0  --    GFR: Estimated Creatinine Clearance: 61.9 mL/min (by C-G formula based on SCr of 0.66 mg/dL). Liver Function Tests: No results for input(s): AST, ALT, ALKPHOS, BILITOT, PROT, ALBUMIN in the last 168 hours. No results for input(s): LIPASE, AMYLASE in the last 168 hours. No results for input(s): AMMONIA in the last 168 hours. Coagulation Profile: Recent Labs  Lab 05/30/19 1718  INR 1.1   Cardiac Enzymes: No results for input(s): CKTOTAL, CKMB, CKMBINDEX, TROPONINI in the last 168 hours. BNP (last 3 results) No results for input(s): PROBNP in the last 8760 hours. HbA1C: No results for input(s): HGBA1C in the last 72 hours. CBG: No results for input(s): GLUCAP in the last 168 hours. Lipid Profile: No results for input(s): CHOL, HDL, LDLCALC, TRIG, CHOLHDL, LDLDIRECT in the last 72 hours. Thyroid Function Tests: No results for input(s): TSH, T4TOTAL, FREET4, T3FREE, THYROIDAB in the last 72 hours. Anemia Panel: No results for input(s): VITAMINB12, FOLATE, FERRITIN, TIBC, IRON, RETICCTPCT in the last 72 hours. Urine analysis:    Component Value Date/Time   COLORURINE AMBER (A) 05/02/2019 1700   APPEARANCEUR CLOUDY (A) 05/02/2019 1700   LABSPEC 1.031 (H) 05/02/2019 1700   PHURINE 5.0  05/02/2019 1700   GLUCOSEU NEGATIVE 05/02/2019 1700   HGBUR NEGATIVE 05/02/2019 1700   BILIRUBINUR NEGATIVE 05/02/2019 1700   KETONESUR 5 (A) 05/02/2019 1700   PROTEINUR 30 (A) 05/02/2019 1700   NITRITE NEGATIVE 05/02/2019 1700   LEUKOCYTESUR NEGATIVE 05/02/2019 1700    Radiological Exams on Admission: DG Chest Portable 1 View  Result Date: 05/30/2019 CLINICAL DATA:  Aspiration EXAM: PORTABLE CHEST 1 VIEW COMPARISON:  May 21, 2019 FINDINGS: Airspace opacities are again noted in the right mid lung zone and left lower  lung zone. These are essentially unchanged from prior chest x-ray. There is no pneumothorax. No large pleural effusion. The heart size is stable. IMPRESSION: Again noted are findings of aspiration pneumonia, minimally improved from prior study. Electronically Signed   By: Katherine Mantlehristopher  Green M.D.   On: 05/30/2019 17:44     Assessment/Plan   Paul MouseRalph Suarez is a 79 y.o. male with medical history significant for chronic hypoxic respiratory failure on 4 L continuous nasal cannula in setting of severe COPD, seizure disorder, anemia of chronic disease with baseline hemoglobin 7.5-9.0, and recent aspiration pneumonia, who is admitted to [facility] on 05/30/2019 with acute on chronic hypoxic resp failure after presenting from SNF [facility] Emergency Department complaining of epistaxis of the right nare.    Principal Problem:   Acute on chronic respiratory failure with hypoxia (HCC) Active Problems:   Major depression, recurrent (HCC)   Seizure disorder (HCC)   Epistaxis   COPD (chronic obstructive pulmonary disease) (HCC)   #) Acute on Chronic Hypoxic Respiratory Failure: in the setting of chronic hypoxic resp failure due to severe COPD on baseline 4 L continuous O2, presenting O2 sats noted to be in mid 80's on baseline 4 L [place]. Now maintaining O2 sats in the mid 90's on 6 L non-breather. Suspect a contribution from aspiration pneumonitis in context of presenting  epistaxis. Presenting CXR shows improving bilateral airspace opacities compared to CXR on 05/21/19 in setting of recent dx of aspiration pna, without evidence of acute cardiopulmonary process. Presentation does not appear to be overtly a/w an acute copd exacerbation at this time, although patient is at increased risk for development of such given suspected presenting aspiration pneumonitis. No clinical or radiographic evidence of acutely decompensated heart failure. Clinically, ACS also appears to be less likely although will check EKG to further evaluate.   Plan: Continue non-breather, titrated to maintain O2 sat's greater than or equal to 90%. Refrain use of nasal cannula, including high flow given presenting epistaxis. Monitor continuous pulse ox. Repeat CBC in the AM. Consider ABG. Continue outpatient augmentin and bactrim for recent prior dx of aspiration pna. Check EKG. Additional work-up and management of epistaxis, as below. Add-on serum Mg level.  Continue home Spiriva. Prn albuterol inhaler.      #) right-sided epistaxis: one episode of right-side epistaxis starting on morning of 05/30/19. Subsequently right-sided nasal packing in the ED, as further described above, with apparent subsequent hemostasis achieved. Risk factors for development of such include chronic supplemental O2 administration via [place] as well as chronic Plavix use. Of note, indication for chronic Plavix use is currently not clear to me and will warrant additiional chart review for clarification. No evidence of hypotension thus far, and presenting Hgb noted to be 9.3, slightly higher than most recent prior value of 7.6 on 05/23/19. INR 1.1. no recent trauma.   Plan: Repeat H&H at 2300 as well as repeat CBC in the morning. I have ordered 2 units PRBC to be typed, screened, and held. Monitor on tele as well as monitor continuous pulse ox. Will hold home Plavix for now, with plan for additional chart review to clarify indications for  patient's chronic plavix use. Anticipate discussions with RT to determine if humidifier compatible without supplemental O2 develivery can be provided to patient at time of discharge. Repeat INR in the morning.       #) Recent COVID-19 infection: diagnosed with COVID-19 per postive PCR on 05/02/19, with ensuing improvement in respiratory symptoms.  As it has been greater than 21 days but within 3 months since the patient's positive COVID-19 test on 05/02/19, there is currently no indication to repeat COVID-19 testing or to maintain Airborne/Contact precautions, and I will be admitting this patient to a non-COVID unit.   Plan: refrain from airborne/contact precautions, as described above.       #) Seizure disorder: on depakote as outpatient. No evidence of acute seizure activity at this time.    Plan: continue outpatient depakote.      #) Depression: on zoloft as outpatient.   Plan: continue home SSRI.     #) Allergic rhinitis: patient reports season allergies for which he takes scheduled claritin as outpatient.   Plan: continue outpatient claritin.      DVT prophylaxis: scd to RLE  Code Status: patient wishes to be DNR/DNI Family Communication: case discussed with patient's daughter, who was present at bedside.  Disposition Plan: Per Rounding Team Consults called: none  Admission status: inpatient; stepdown unit.     PLEASE NOTE THAT DRAGON DICTATION SOFTWARE WAS USED IN THE CONSTRUCTION OF THIS NOTE.   Angie Fava DO [facility] Pager [phone] From 6PM- 2AM.   Otherwise, please contact night-coverage  [email or web address] Password [facility]  05/30/2019, 6:36 PM

## 2019-05-30 NOTE — ED Notes (Addendum)
Pt coughed up significant amount of bloody phlegm that had 2 large bloody clots in it.

## 2019-05-31 DIAGNOSIS — J449 Chronic obstructive pulmonary disease, unspecified: Secondary | ICD-10-CM

## 2019-05-31 DIAGNOSIS — R040 Epistaxis: Secondary | ICD-10-CM | POA: Diagnosis present

## 2019-05-31 LAB — CBC WITH DIFFERENTIAL/PLATELET
Abs Immature Granulocytes: 0.06 10*3/uL (ref 0.00–0.07)
Basophils Absolute: 0.1 10*3/uL (ref 0.0–0.1)
Basophils Relative: 1 %
Eosinophils Absolute: 0 10*3/uL (ref 0.0–0.5)
Eosinophils Relative: 0 %
HCT: 24.2 % — ABNORMAL LOW (ref 39.0–52.0)
Hemoglobin: 7.4 g/dL — ABNORMAL LOW (ref 13.0–17.0)
Immature Granulocytes: 1 %
Lymphocytes Relative: 23 %
Lymphs Abs: 2.2 10*3/uL (ref 0.7–4.0)
MCH: 30.2 pg (ref 26.0–34.0)
MCHC: 30.6 g/dL (ref 30.0–36.0)
MCV: 98.8 fL (ref 80.0–100.0)
Monocytes Absolute: 1.3 10*3/uL — ABNORMAL HIGH (ref 0.1–1.0)
Monocytes Relative: 13 %
Neutro Abs: 6 10*3/uL (ref 1.7–7.7)
Neutrophils Relative %: 62 %
Platelets: 356 10*3/uL (ref 150–400)
RBC: 2.45 MIL/uL — ABNORMAL LOW (ref 4.22–5.81)
RDW: 15.7 % — ABNORMAL HIGH (ref 11.5–15.5)
WBC: 9.6 10*3/uL (ref 4.0–10.5)
nRBC: 0 % (ref 0.0–0.2)

## 2019-05-31 LAB — COMPREHENSIVE METABOLIC PANEL
ALT: 11 U/L (ref 0–44)
AST: 16 U/L (ref 15–41)
Albumin: 2.3 g/dL — ABNORMAL LOW (ref 3.5–5.0)
Alkaline Phosphatase: 51 U/L (ref 38–126)
Anion gap: 3 — ABNORMAL LOW (ref 5–15)
BUN: 32 mg/dL — ABNORMAL HIGH (ref 8–23)
CO2: 27 mmol/L (ref 22–32)
Calcium: 7.6 mg/dL — ABNORMAL LOW (ref 8.9–10.3)
Chloride: 108 mmol/L (ref 98–111)
Creatinine, Ser: 0.47 mg/dL — ABNORMAL LOW (ref 0.61–1.24)
GFR calc Af Amer: >60 mL/min (ref >60)
GFR calc non Af Amer: >60 mL/min (ref >60)
Glucose, Bld: 96 mg/dL (ref 70–99)
Potassium: 4.9 mmol/L (ref 3.5–5.1)
Sodium: 138 mmol/L (ref 135–145)
Total Bilirubin: 0.7 mg/dL (ref 0.3–1.2)
Total Protein: 5 g/dL — ABNORMAL LOW (ref 6.5–8.1)

## 2019-05-31 LAB — PROTIME-INR
INR: 1.2 (ref 0.8–1.2)
Prothrombin Time: 15.5 seconds — ABNORMAL HIGH (ref 11.4–15.2)

## 2019-05-31 LAB — PREPARE RBC (CROSSMATCH)

## 2019-05-31 LAB — ABO/RH: ABO/RH(D): A POS

## 2019-05-31 LAB — MAGNESIUM: Magnesium: 1.8 mg/dL (ref 1.7–2.4)

## 2019-05-31 LAB — HEMOGLOBIN AND HEMATOCRIT, BLOOD
HCT: 27.9 % — ABNORMAL LOW (ref 39.0–52.0)
Hemoglobin: 8.5 g/dL — ABNORMAL LOW (ref 13.0–17.0)

## 2019-05-31 MED ORDER — ORAL CARE MOUTH RINSE
15.0000 mL | Freq: Two times a day (BID) | OROMUCOSAL | Status: DC
  Administered 2019-06-01 – 2019-06-06 (×8): 15 mL via OROMUCOSAL

## 2019-05-31 MED ORDER — OXYMETAZOLINE HCL 0.05 % NA SOLN
1.0000 | Freq: Once | NASAL | Status: DC | PRN
  Filled 2019-05-31: qty 15

## 2019-05-31 MED ORDER — TRIPLE ANTIBIOTIC 3.5-400-5000 EX OINT
1.0000 "application " | TOPICAL_OINTMENT | Freq: Once | CUTANEOUS | Status: DC | PRN
  Filled 2019-05-31: qty 1

## 2019-05-31 MED ORDER — SODIUM CHLORIDE 0.9% IV SOLUTION: Freq: Once | INTRAVENOUS | Status: AC

## 2019-05-31 MED ORDER — UMECLIDINIUM BROMIDE 62.5 MCG/INH IN AEPB
1.0000 | INHALATION_SPRAY | Freq: Every day | RESPIRATORY_TRACT | Status: DC
  Administered 2019-06-01 – 2019-06-06 (×6): 1 via RESPIRATORY_TRACT
  Filled 2019-05-31: qty 7

## 2019-05-31 MED ORDER — SILVER NITRATE-POT NITRATE 75-25 % EX MISC
1.0000 | Freq: Once | CUTANEOUS | Status: DC | PRN
  Filled 2019-05-31: qty 1

## 2019-05-31 MED ORDER — LIDOCAINE-EPINEPHRINE (PF) 1 %-1:200000 IJ SOLN
0.0000 mL | Freq: Once | INTRAMUSCULAR | Status: DC | PRN
  Filled 2019-05-31: qty 30

## 2019-05-31 MED ORDER — LIDOCAINE HCL 4 % EX SOLN
0.0000 mL | Freq: Once | CUTANEOUS | Status: DC | PRN
  Filled 2019-05-31: qty 50

## 2019-05-31 MED ORDER — LIDOCAINE HCL 2 % EX GEL
1.0000 "application " | Freq: Once | CUTANEOUS | Status: DC | PRN
  Filled 2019-05-31: qty 4250

## 2019-05-31 MED ORDER — CHLORHEXIDINE GLUCONATE CLOTH 2 % EX PADS
6.0000 | MEDICATED_PAD | Freq: Every day | CUTANEOUS | Status: DC
  Administered 2019-05-31 – 2019-06-06 (×7): 6 via TOPICAL

## 2019-05-31 MED ORDER — CHLORHEXIDINE GLUCONATE 0.12 % MT SOLN
15.0000 mL | Freq: Two times a day (BID) | OROMUCOSAL | Status: DC
  Administered 2019-05-31 – 2019-06-06 (×12): 15 mL via OROMUCOSAL
  Filled 2019-05-31 (×10): qty 15

## 2019-05-31 NOTE — Progress Notes (Signed)
PROGRESS NOTE  Jymir Dunaj  RKY:[id number] DOB: October 20, 1940 DOA: 05/30/2019 PCP: Pecola Lawless, MD   Brief Narrative: Corydon Schweiss is a 79 y.o. male with a history of chronic hypoxic respiratory failure due to COPD, CAD, HTN, seizure disorder, and recent aspiration pneumonia complicated by hemoptysis who presented from SNF for a nose bleed. He had been hospitalized 2/26 - 3/7 for pneumonia, then again 3/8 - 3/11 for recurrent aspiration pneumonia suspected to be due to large hiatal hernia (not dysphagia) complicated by lung abscess, ultimately discharged on augmentin and bactrim to end 4/1. He had hemoptysis during that admission and aspirin was held, with plavix continued.  Assessment & Plan: Principal Problem:   Acute on chronic respiratory failure with hypoxia (HCC) Active Problems:   Major depression, recurrent (HCC)   Seizure disorder (HCC)   Epistaxis   COPD (chronic obstructive pulmonary disease) (HCC)  Acute on chronic hypoxemic respiratory failure due to aspiration pneumonia, pneumonitis, lung abscess and COPD: Dyspnea worsened by anemia.  - Continue supplemental oxygen via mask, consider transition to humidified O2 once stabilizing. - Continue antibiotics. Scheduled to stay on augmentin and bactrim through 06/14/2019.  - Had modified barium swallow study showing no aspiration. Aspiration thought to be related to reflux complicated by hiatal hernia. Continue PPI.  - Continue bronchodilators.   Acute blood loss anemia on anemia of chronic disease:  - Transfuse 1u PRBCs, hold 1 ahead given ongoing epistaxis.  - Recheck serial H/H. Admit to SDU.  Epistaxis: Began rebleeding this afternoon. ENT consulted, replaced packing.  - Continue packing until at least Tuesday when this could be removed in ENT office vs. hospital if still admitted. - Hold plavix. - Prn afrin.   CAD: No chest pain.  - Hold plavix. Note senile purpura, chronic anemia, and recurrent bleeding. Would favor not  restarting antiplatelet or giving pharmacologic DVT prophylaxis.  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 Not due to PVD.  History of staphylococcal bacteremia: DC'ed 05/11/19 on IV vancomycin, completed 05/20/2019.   Cognitive impairment, weakness: Recent reported failure to thrive with repeated admissions, physical deconditioning.  - PT/OT.   Severe protein calorie malnutrition:  - Dietitian consulted.   Seizure disorder:  - Continue home depakote.   DVT prophylaxis: SCDs Code Status: DNR POA Family Communication: Daughter at bedside Disposition Plan: Patient from SNF, likely to return to SNF once epistaxis and anemia stabilized with stabilizing pulmonary status as well.   Consultants:   ENT  Procedures:   Nasal packing by Dr. Pollyann Kennedy 3/18.  Antimicrobials:  Augmentin, bactrim through 4/1   Subjective: Wants to eat, bleeding resolved this AM but recurred in post nasal drip this PM. No other unusual bleeding/bruising. No dyspnea or chest pain.   Objective: Vitals:   05/31/19 1100 05/31/19 1200 05/31/19 1226 05/31/19 1230  BP: 121/81 139/73 126/66 126/66  Pulse: (!) 120 (!) 145 (!) 132 (!) 130  Resp: (!) 22 (!) 23 (!) 22   Temp:   97.8 F (36.6 C)   TempSrc:   Oral   SpO2: 100% 92%  96%    Intake/Output Summary (Last 24 hours) at 05/31/2019 1300 Last data filed at 05/31/2019 1226 Gross per 24 hour  Intake 1144.68 ml  Output --  Net 1144.68 ml   Gen: 79 y.o. male in no distress  Pulm: Non-labored breathing by mask. Clear to auscultation bilaterally.  CV: Regular rate and rhythm. No murmur, rub, or gallop. No JVD, no pedal edema. GI: Abdomen soft, non-tender, non-distended, with normoactive  bowel sounds. No organomegaly or masses felt. Ext: Warm, no deformities Skin: Senile purpura. No other rashes, lesions or ulcers Neuro: Alert and oriented. No focal neurological deficits. Psych: Judgement and insight appear normal. Mood & affect appropriate.   Data Reviewed: I have  personally reviewed following labs and imaging studies  CBC: Recent Labs  Lab 05/30/19 1718 05/30/19 2230 05/31/19 0500  WBC 9.3  --  9.6  NEUTROABS 6.2  --  6.0  HGB 9.3* 8.5* 7.4*  HCT 30.3* 27.4* 24.2*  MCV 98.4  --  98.8  PLT 424*  --  356   Basic Metabolic Panel: Recent Labs  Lab 05/30/19 1718 05/31/19 0500  NA 137 138  K 4.2 4.9  CL 104 108  CO2 25 27  GLUCOSE 132* 96  BUN 27* 32*  CREATININE 0.66 0.47*  CALCIUM 8.4* 7.6*  MG 2.0 1.8   GFR: Estimated Creatinine Clearance: 61.9 mL/min (A) (by C-G formula based on SCr of 0.47 mg/dL (L)). Liver Function Tests: Recent Labs  Lab 05/31/19 0500  AST 16  ALT 11  ALKPHOS 51  BILITOT 0.7  PROT 5.0*  ALBUMIN 2.3*   No results for input(s): LIPASE, AMYLASE in the last 168 hours. No results for input(s): AMMONIA in the last 168 hours. Coagulation Profile: Recent Labs  Lab 05/30/19 1718 05/31/19 0500  INR 1.1 1.2   Cardiac Enzymes: No results for input(s): CKTOTAL, CKMB, CKMBINDEX, TROPONINI in the last 168 hours. BNP (last 3 results) No results for input(s): PROBNP in the last 8760 hours. HbA1C: No results for input(s): HGBA1C in the last 72 hours. CBG: No results for input(s): GLUCAP in the last 168 hours. Lipid Profile: No results for input(s): CHOL, HDL, LDLCALC, TRIG, CHOLHDL, LDLDIRECT in the last 72 hours. Thyroid Function Tests: No results for input(s): TSH, T4TOTAL, FREET4, T3FREE, THYROIDAB in the last 72 hours. Anemia Panel: No results for input(s): VITAMINB12, FOLATE, FERRITIN, TIBC, IRON, RETICCTPCT in the last 72 hours. Urine analysis:    Component Value Date/Time   COLORURINE AMBER (A) 05/02/2019 1700   APPEARANCEUR CLOUDY (A) 05/02/2019 1700   LABSPEC 1.031 (H) 05/02/2019 1700   PHURINE 5.0 05/02/2019 1700   GLUCOSEU NEGATIVE 05/02/2019 1700   HGBUR NEGATIVE 05/02/2019 1700   BILIRUBINUR NEGATIVE 05/02/2019 1700   KETONESUR 5 (A) 05/02/2019 1700   PROTEINUR 30 (A) 05/02/2019 1700    NITRITE NEGATIVE 05/02/2019 1700   LEUKOCYTESUR NEGATIVE 05/02/2019 1700   Recent Results (from the past 240 hour(s))  Culture, blood (Routine X 2) w Reflex to ID Panel     Status: None   Collection Time: 05/21/19  5:27 PM   Specimen: BLOOD  Result Value Ref Range Status   Specimen Description BLOOD LEFT ANTECUBITAL  Final   Special Requests   Final    BOTTLES DRAWN AEROBIC AND ANAEROBIC Blood Culture adequate volume   Culture   Final    NO GROWTH 5 DAYS Performed at [facility], [address] [place], [place] [address]    Report Status 05/26/2019 FINAL  Final  Culture, blood (Routine X 2) w Reflex to ID Panel     Status: None   Collection Time: 05/21/19  5:32 PM   Specimen: BLOOD  Result Value Ref Range Status   Specimen Description BLOOD BLOOD LEFT HAND  Final   Special Requests   Final    BOTTLES DRAWN AEROBIC AND ANAEROBIC Blood Culture adequate volume   Culture   Final    NO GROWTH 5  DAYS Performed at [facility], [address] [place], [place] [address]    Report Status 05/26/2019 FINAL  Final  MRSA PCR Screening     Status: None   Collection Time: 05/22/19  2:42 PM   Specimen: Nasopharyngeal  Result Value Ref Range Status   MRSA by PCR NEGATIVE NEGATIVE Final    Comment:        The GeneXpert MRSA Assay (FDA approved for NASAL specimens only), is one component of a comprehensive MRSA colonization surveillance program. It is not intended to diagnose MRSA infection nor to guide or monitor treatment for MRSA infections. Performed at [facility], [address] [place], [place] [address]       Radiology Studies: DG Chest Portable 1 View  Result Date: 05/30/2019 CLINICAL DATA:  Aspiration EXAM: PORTABLE CHEST 1 VIEW COMPARISON:  May 21, 2019 FINDINGS: Airspace opacities are again noted in the right mid lung zone and left lower lung zone. These are essentially unchanged from prior chest x-ray. There is no pneumothorax. No large  pleural effusion. The heart size is stable. IMPRESSION: Again noted are findings of aspiration pneumonia, minimally improved from prior study. Electronically Signed   By: Constance Holster M.D.   On: 05/30/2019 17:44    Scheduled Meds: . sodium chloride   Intravenous Once  . sodium chloride   Intravenous Once  . amoxicillin-clavulanate  875 mg Oral Q12H  . vitamin C  500 mg Oral Daily  . divalproex  500 mg Oral QHS  . loratadine  10 mg Oral Daily  . multivitamin with minerals  1 tablet Oral Daily  . pantoprazole  40 mg Oral Daily  . sertraline  25 mg Oral Daily  . sodium chloride flush  3 mL Intravenous Q12H  . sulfamethoxazole-trimethoprim  20 mL Oral Q12H  . tiotropium  18 mcg Inhalation Daily   Continuous Infusions:   LOS: 1 day   Time spent: 35 minutes.  Patrecia Pour, MD [facility] [email or web address] 05/31/2019, 1:00 PM

## 2019-05-31 NOTE — ED Notes (Signed)
Called pharmacy to get PRN medications verified so can pull to be ready for ENT. Was told by [facility] pharmacist the medications are in the ENT cart.

## 2019-05-31 NOTE — Consult Note (Signed)
Reason for Consult: Epistaxis Referring Physician: Tyrone Nine, MD  Paul Suarez is an 79 y.o. male.  HPI: Admitted earlier this morning with epistaxis and anemia, during to receive blood transfusion.  On Plavix therapy.  Lives in a nursing home.  Past Medical History:  Diagnosis Date  . CAD (coronary artery disease)   . COPD (chronic obstructive pulmonary disease) (HCC)   . Depression   . Essential hypertension   . Seizure disorder (HCC)   . Staphylococcus aureus bacteremia 05/06/2019    Past Surgical History:  Procedure Laterality Date  . BELOW KNEE LEG AMPUTATION Left     History reviewed. No pertinent family history.  Social History:  reports that he has quit smoking. He quit after 20.00 years of use. He has never used smokeless tobacco. He reports previous alcohol use. He reports that he does not use drugs.  Allergies: No Known Allergies  Medications: Reviewed  Results for orders placed or performed during the hospital encounter of 05/30/19 (from the past 48 hour(s))  CBC with Differential/Platelet     Status: Abnormal   Collection Time: 05/30/19  5:18 PM  Result Value Ref Range   WBC 9.3 4.0 - 10.5 K/uL   RBC 3.08 (L) 4.22 - 5.81 MIL/uL   Hemoglobin 9.3 (L) 13.0 - 17.0 g/dL   HCT 34.7 (L) 42.5 - 95.6 %   MCV 98.4 80.0 - 100.0 fL   MCH 30.2 26.0 - 34.0 pg   MCHC 30.7 30.0 - 36.0 g/dL   RDW 38.7 (H) 56.4 - 33.2 %   Platelets 424 (H) 150 - 400 K/uL   nRBC 0.0 0.0 - 0.2 %   Neutrophils Relative % 65 %   Neutro Abs 6.2 1.7 - 7.7 K/uL   Lymphocytes Relative 17 %   Lymphs Abs 1.5 0.7 - 4.0 K/uL   Monocytes Relative 15 %   Monocytes Absolute 1.4 (H) 0.1 - 1.0 K/uL   Eosinophils Relative 1 %   Eosinophils Absolute 0.1 0.0 - 0.5 K/uL   Basophils Relative 1 %   Basophils Absolute 0.1 0.0 - 0.1 K/uL   Immature Granulocytes 1 %   Abs Immature Granulocytes 0.06 0.00 - 0.07 K/uL    Comment: Performed at [facility], [address], [place],  [place] [address]  Basic metabolic panel     Status: Abnormal   Collection Time: 05/30/19  5:18 PM  Result Value Ref Range   Sodium 137 135 - 145 mmol/L   Potassium 4.2 3.5 - 5.1 mmol/L   Chloride 104 98 - 111 mmol/L   CO2 25 22 - 32 mmol/L   Glucose, Bld 132 (H) 70 - 99 mg/dL    Comment: Glucose reference range applies only to samples taken after fasting for at least 8 hours.   BUN 27 (H) 8 - 23 mg/dL   Creatinine, Ser 4.16 0.61 - 1.24 mg/dL   Calcium 8.4 (L) 8.9 - 10.3 mg/dL   GFR calc non Af Amer >60 >60 mL/min   GFR calc Af Amer >60 >60 mL/min   Anion gap 8 5 - 15    Comment: Performed at [facility], [address], [place], [place] [address]  Blood gas, venous (at [facility] and [facility], not at [facility])     Status: Abnormal   Collection Time: 05/30/19  5:18 PM  Result Value Ref Range   FIO2 NOT PROVIDED    pH, Ven 7.336 7.250 - 7.430   pCO2, Ven 48.2 44.0 -  60.0 mmHg   pO2, Ven 53.4 (H) 32.0 - 45.0 mmHg   Bicarbonate 25.0 20.0 - 28.0 mmol/L   Acid-base deficit 0.4 0.0 - 2.0 mmol/L   O2 Saturation 79.6 %   Patient temperature 98.6     Comment: Performed at [facility], [address] [place], [place] [address]  Protime-INR     Status: None   Collection Time: 05/30/19  5:18 PM  Result Value Ref Range   Prothrombin Time 14.4 11.4 - 15.2 seconds   INR 1.1 0.8 - 1.2    Comment: (NOTE) INR goal varies based on device and disease states. Performed at [facility], [address], [place], [place] [address]   APTT     Status: None   Collection Time: 05/30/19  5:18 PM  Result Value Ref Range   aPTT 27 24 - 36 seconds    Comment: Performed at [facility], [address], [place], [place] [address]  Magnesium     Status: None   Collection Time: 05/30/19  5:18 PM  Result Value Ref Range   Magnesium 2.0 1.7 - 2.4 mg/dL    Comment: Performed at [facility], [address], [place], [place] [address]   Hemoglobin and hematocrit, blood     Status: Abnormal   Collection Time: 05/30/19 10:30 PM  Result Value Ref Range   Hemoglobin 8.5 (L) 13.0 - 17.0 g/dL   HCT 72.5 (L) 36.6 - 44.0 %    Comment: Performed at [facility], [address], [place], [place] [address]  Prepare RBC (crossmatch)     Status: None   Collection Time: 05/30/19 10:30 PM  Result Value Ref Range   Order Confirmation      ORDER PROCESSED BY BLOOD BANK Performed at [facility], [address] [place], [place] [address]   Type and screen     Status: None (Preliminary result)   Collection Time: 05/30/19 10:30 PM  Result Value Ref Range   ABO/RH(D) A POS    Antibody Screen NEG    Sample Expiration 06/02/2019,2359    Unit Number [id number]    Blood Component Type RED CELLS,LR    Unit division 00    Status of Unit ALLOCATED    Transfusion Status OK TO TRANSFUSE    Crossmatch Result COMPATIBLE    Unit Number [id number]    Blood Component Type RED CELLS,LR    Unit division 00    Status of Unit ISSUED    Transfusion Status OK TO TRANSFUSE    Crossmatch Result COMPATIBLE   ABO/Rh     Status: None   Collection Time: 05/30/19 10:30 PM  Result Value Ref Range   ABO/RH(D)      A POS Performed at [facility], [address] [place], [place] [address]   Magnesium     Status: None   Collection Time: 05/31/19  5:00 AM  Result Value Ref Range   Magnesium 1.8 1.7 - 2.4 mg/dL    Comment: Performed at [facility], [address] [place], [place] [address]  Comprehensive metabolic panel     Status: Abnormal   Collection Time: 05/31/19  5:00 AM  Result Value Ref Range   Sodium 138 135 - 145 mmol/L   Potassium 4.9 3.5 - 5.1 mmol/L   Chloride 108 98 - 111 mmol/L   CO2 27 22 - 32 mmol/L   Glucose, Bld 96 70 -  99 mg/dL    Comment: Glucose reference range applies only to samples taken after fasting for at least 8 hours.   BUN 32 (H) 8 -  23 mg/dL   Creatinine, Ser 0.47 (L) 0.61 - 1.24 mg/dL   Calcium 7.6 (L) 8.9 - 10.3 mg/dL   Total Protein 5.0 (L) 6.5 - 8.1 g/dL   Albumin 2.3 (L) 3.5 - 5.0 g/dL   AST 16 15 - 41 U/L   ALT 11 0 - 44 U/L   Alkaline Phosphatase 51 38 - 126 U/L   Total Bilirubin 0.7 0.3 - 1.2 mg/dL   GFR calc non Af Amer >60 >60 mL/min   GFR calc Af Amer >60 >60 mL/min   Anion gap 3 (L) 5 - 15    Comment: Performed at [facility], [place] [address] [place], [place] [address]  CBC WITH DIFFERENTIAL     Status: Abnormal   Collection Time: 05/31/19  5:00 AM  Result Value Ref Range   WBC 9.6 4.0 - 10.5 K/uL   RBC 2.45 (L) 4.22 - 5.81 MIL/uL   Hemoglobin 7.4 (L) 13.0 - 17.0 g/dL   HCT 24.2 (L) 39.0 - 52.0 %   MCV 98.8 80.0 - 100.0 fL   MCH 30.2 26.0 - 34.0 pg   MCHC 30.6 30.0 - 36.0 g/dL   RDW 15.7 (H) 11.5 - 15.5 %   Platelets 356 150 - 400 K/uL   nRBC 0.0 0.0 - 0.2 %   Neutrophils Relative % 62 %   Neutro Abs 6.0 1.7 - 7.7 K/uL   Lymphocytes Relative 23 %   Lymphs Abs 2.2 0.7 - 4.0 K/uL   Monocytes Relative 13 %   Monocytes Absolute 1.3 (H) 0.1 - 1.0 K/uL   Eosinophils Relative 0 %   Eosinophils Absolute 0.0 0.0 - 0.5 K/uL   Basophils Relative 1 %   Basophils Absolute 0.1 0.0 - 0.1 K/uL   Immature Granulocytes 1 %   Abs Immature Granulocytes 0.06 0.00 - 0.07 K/uL    Comment: Performed at [facility], [place] [address] [place], [place] [address]  Protime-INR     Status: Abnormal   Collection Time: 05/31/19  5:00 AM  Result Value Ref Range   Prothrombin Time 15.5 (H) 11.4 - 15.2 seconds   INR 1.2 0.8 - 1.2    Comment: (NOTE) INR goal varies based on device and disease states. Performed at [facility], [place] [address], [place], [place] [address]   Prepare RBC (crossmatch)     Status: None   Collection Time: 05/31/19 12:11 PM  Result Value Ref Range   Order Confirmation      BB SAMPLE OR UNITS ALREADY AVAILABLE Performed at [place] [address], [place], [place] [address]     DG Chest Portable 1 View  Result Date: 05/30/2019 CLINICAL DATA:  Aspiration EXAM: PORTABLE CHEST 1 VIEW COMPARISON:  May 21, 2019 FINDINGS: Airspace opacities are again noted in the right mid lung zone and left lower lung zone. These are essentially unchanged from prior chest x-ray. There is no pneumothorax. No large pleural effusion. The heart size is stable. IMPRESSION: Again noted are findings of aspiration pneumonia, minimally improved from prior study. Electronically Signed   By: Constance Holster M.D.   On: 05/30/2019 17:44    POE:[id number] except as listed in admit H&P  Blood pressure 115/75, pulse (!) 114, temperature 97.9 F (36.6 C), temperature source Oral, resp.  rate 20, SpO2 97 %.  PHYSICAL EXAM: Overall appearance: Elderly gentleman, face max oxygen in place, heavy gurgling sound in the pharynx Head:  Normocephalic, atraumatic. Ears: External ears look healthy. Nose: External nose is healthy in appearance.  Inflatable packing in the right nasal cavity, no active bleeding at the moment.  The packing was removed.  Right nasal cavity was inspected thoroughly after suctioning out any blood and clot.  There is no active bleeding identified.  Topical Xylocaine with Afrin was applied for anesthetic and decongestant, and a long Slimline Merocel pack was placed without difficulty.  This was inflated with the local anesthetic solution. Oral Cavity/Pharynx:  There copious secretions and some bloody material in the pharynx that he is able to suction and spit out. Larynx/Hypopharynx: Deferred Neuro: Is very difficult to examine as he is not very cooperative and appears to have some dementia. Neck: No palpable neck masses.  Studies Reviewed: none  Procedures: none   Assessment/Plan: Right-sided epistaxis secondary to Plavix.  Plavix has been held earlier today.  He is receiving red blood cell transfusion.  New packing  placed.  No active bleeding.  Continue to monitor.  If there is no further bleeding I would plan to remove the packing in 5 days.  That would be Tuesday either in the hospital or in our office.  Serena Colonel 05/31/2019, 2:34 PM

## 2019-05-31 NOTE — ED Notes (Signed)
DR Pollyann Kennedy ENT at bedside

## 2019-05-31 NOTE — ED Notes (Signed)
Daughter, [phone]

## 2019-06-01 ENCOUNTER — Encounter (HOSPITAL_COMMUNITY): Payer: Self-pay | Admitting: Internal Medicine

## 2019-06-01 LAB — CBC
HCT: 25 % — ABNORMAL LOW (ref 39.0–52.0)
Hemoglobin: 7.8 g/dL — ABNORMAL LOW (ref 13.0–17.0)
MCH: 30 pg (ref 26.0–34.0)
MCHC: 31.2 g/dL (ref 30.0–36.0)
MCV: 96.2 fL (ref 80.0–100.0)
Platelets: 319 10*3/uL (ref 150–400)
RBC: 2.6 MIL/uL — ABNORMAL LOW (ref 4.22–5.81)
RDW: 16.2 % — ABNORMAL HIGH (ref 11.5–15.5)
WBC: 6.8 10*3/uL (ref 4.0–10.5)
nRBC: 0 % (ref 0.0–0.2)

## 2019-06-01 LAB — BASIC METABOLIC PANEL
Anion gap: 6 (ref 5–15)
BUN: 18 mg/dL (ref 8–23)
CO2: 27 mmol/L (ref 22–32)
Calcium: 8.1 mg/dL — ABNORMAL LOW (ref 8.9–10.3)
Chloride: 103 mmol/L (ref 98–111)
Creatinine, Ser: 0.54 mg/dL — ABNORMAL LOW (ref 0.61–1.24)
GFR calc Af Amer: >60 mL/min (ref >60)
GFR calc non Af Amer: >60 mL/min (ref >60)
Glucose, Bld: 123 mg/dL — ABNORMAL HIGH (ref 70–99)
Potassium: 4.3 mmol/L (ref 3.5–5.1)
Sodium: 136 mmol/L (ref 135–145)

## 2019-06-01 LAB — HEMOGLOBIN AND HEMATOCRIT, BLOOD
HCT: 29.2 % — ABNORMAL LOW (ref 39.0–52.0)
Hemoglobin: 9.1 g/dL — ABNORMAL LOW (ref 13.0–17.0)

## 2019-06-01 MED ORDER — AMOXICILLIN-POT CLAVULANATE 875-125 MG PO TABS
1.0000 | ORAL_TABLET | Freq: Two times a day (BID) | ORAL | Status: DC
  Administered 2019-06-01 – 2019-06-06 (×10): 1 via ORAL
  Filled 2019-06-01 (×10): qty 1

## 2019-06-01 MED ORDER — SODIUM CHLORIDE 0.9 % IV SOLN: INTRAVENOUS | Status: AC

## 2019-06-01 MED ORDER — ENSURE ENLIVE PO LIQD
237.0000 mL | Freq: Three times a day (TID) | ORAL | Status: DC
  Administered 2019-06-01 – 2019-06-06 (×14): 237 mL via ORAL

## 2019-06-01 MED ORDER — SULFAMETHOXAZOLE-TRIMETHOPRIM 800-160 MG PO TABS
1.0000 | ORAL_TABLET | Freq: Two times a day (BID) | ORAL | Status: DC
  Administered 2019-06-01 – 2019-06-06 (×11): 1 via ORAL
  Filled 2019-06-01 (×11): qty 1

## 2019-06-01 NOTE — [place] Notes (Signed)
PT Cancellation Note  Patient Details Name: Paul Suarez MRN: [id number] DOB: 05-02-1940   Cancelled Treatment:     PT order received but eval deferred - per RN low BP and pt on non-re-breather.  Will follow.   Kalee Mcclenathan 06/01/2019, 9:40 AM

## 2019-06-01 NOTE — [place] Notes (Signed)
OT Cancellation Note  Patient Details Name: Paul Suarez MRN: [id number] DOB: Apr 18, 1940   Cancelled Treatment:    Reason Eval/Treat Not Completed: Medical issues which prohibited therapy. Low BP and now requires use of non-rebreather. Will attempt OT eval on different date and time  Fidela Juneau 06/01/2019, 12:39 PM

## 2019-06-01 NOTE — [place] Notes (Signed)
PROGRESS NOTE  [name]  Paul Suarez:[id number] DOB: 03-01-1941 DOA: 05/30/2019 PCP: Hendricks Limes, MD   Brief Narrative: Paul Suarez is a 79 y.o. male with a history of chronic hypoxic respiratory failure due to COPD, CAD, HTN, seizure disorder, and recent aspiration pneumonia complicated by hemoptysis who presented from SNF for a nose bleed. He had been hospitalized 2/26 - 3/7 for pneumonia, then again 3/8 - 3/11 for recurrent aspiration pneumonia suspected to be due to large hiatal hernia (not dysphagia) complicated by lung abscess, ultimately discharged on augmentin and bactrim to end 4/1. He had hemoptysis during that admission and aspirin was held, with plavix continued. On admission here the right nare was packed, 1u PRBCs given and ENT consulted for recurrent bleeding which resolved with holding plavix and repacking. Hgb remains low, BP low, will remain in SDU and repeat transfusion.  Assessment & Plan: Principal Problem:   Acute on chronic respiratory failure with hypoxia (HCC) Active Problems:   Major depression, recurrent (HCC)   Seizure disorder (HCC)   Epistaxis   COPD (chronic obstructive pulmonary disease) (HCC)  Acute on chronic hypoxemic respiratory failure due to aspiration pneumonia, pneumonitis, lung abscess and COPD: Dyspnea worsened by anemia.  - Continue supplemental oxygen via mask, consider transition to humidified O2 once stabilizing. - Continue antibiotics. Scheduled to stay on augmentin and bactrim through 06/14/2019.  - Had modified barium swallow study showing no aspiration. Aspiration thought to be related to reflux complicated by hiatal hernia. Continue PPI. Liberalize diet to improve po intake. - Continue bronchodilators.   Acute blood loss anemia on anemia of chronic disease: s/p 1u PRBCs 3/18. INR 1.2. - Repeat 1u PRBCs today. Check CBC in AM or prn bleeding.   Hypotension: No evidence of end organ dysfunction. Afebrile.  - Giving transfusion. Note recently low  BPs in outpatient setting (BPs 90-100's/50-60's).  - Monitor in SDU - Will give isotonic saline. Encourage adequate po   Epistaxis: Began rebleeding this afternoon. ENT consulted, replaced packing.  - Continue packing until at least Tuesday when this could be removed in ENT office vs. hospital if still admitted. - Holding plavix. Will not be restarting this. - Prn afrin.   CAD: No chest pain.  - DC plavix. Note senile purpura, chronic anemia, and recurrent bleeding. Would favor not restarting antiplatelet or giving pharmacologic DVT prophylaxis.  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 Not due to PVD.  History of staphylococcal bacteremia: DC'ed 05/11/19 on IV vancomycin, completed 05/20/2019.   Cognitive impairment, weakness: Recent reported failure to thrive with repeated admissions, physical deconditioning.  - PT/OT.   Severe protein calorie malnutrition:  - Dietitian consulted.   Seizure disorder:  - Continue home depakote.   DVT prophylaxis: SCDs Code Status: DNR POA Family Communication: Daughter at bedside Disposition Plan: Patient from SNF, likely to return to SNF once epistaxis and anemia stabilized with stabilizing pulmonary status as well.  Consultants:   ENT  Procedures:   Nasal packing by Dr. Constance Holster 3/18.  Antimicrobials:  Augmentin, bactrim through 4/1   Subjective: No further bleeding since yesterday. Eating ok. No shortness of breath, no overnight events reported.  Objective: Vitals:   06/01/19 1229 06/01/19 1230 06/01/19 1251 06/01/19 1300  BP: (!) 84/39 (!) 84/39 (!) 84/46 (!) 84/47  Pulse: 98 98  (!) 114  Resp: (!) 22 (!) 22  (!) 23  Temp: 97.9 F (36.6 C)  98.7 F (37.1 C)   TempSrc: Oral  Axillary   SpO2: 95% 94%  97%  Weight:      Height:        Intake/Output Summary (Last 24 hours) at 06/01/2019 1403 Last data filed at 06/01/2019 0700 Gross per 24 hour  Intake 1115 ml  Output 1150 ml  Net -35 ml    Gen: 79 y.o. male in no distress,  cachectic Pulm: Nonlabored breathing by mask, right nasal packing stable without bleeding noted. Clear. CV: Regular rate and rhythm. No murmur, rub, or gallop. No JVD, no dependent edema. GI: Abdomen soft, non-tender, non-distended, with normoactive bowel sounds.  Ext: Warm, no deformities Skin: No new rashes, lesions or ulcers on visualized skin. Senile purpura stable. Neuro: Alert and incompletely oriented. No focal neurological deficits. Psych: Judgement and insight appear impaired. Mood euthymic & affect congruent. Behavior is appropriate.  Data Reviewed: I have personally reviewed following labs and imaging studies  CBC: Recent Labs  Lab 05/30/19 1718 05/30/19 2230 05/31/19 0500 05/31/19 1732 06/01/19 0154  WBC 9.3  --  9.6  --  6.8  NEUTROABS 6.2  --  6.0  --   --   HGB 9.3* 8.5* 7.4* 8.5* 7.8*  HCT 30.3* 27.4* 24.2* 27.9* 25.0*  MCV 98.4  --  98.8  --  96.2  PLT 424*  --  356  --  319   Basic Metabolic Panel: Recent Labs  Lab 05/30/19 1718 05/31/19 0500 06/01/19 0154  NA 137 138 136  K 4.2 4.9 4.3  CL 104 108 103  CO2 25 27 27   GLUCOSE 132* 96 123*  BUN 27* 32* 18  CREATININE 0.66 0.47* 0.54*  CALCIUM 8.4* 7.6* 8.1*  MG 2.0 1.8  --    GFR: Estimated Creatinine Clearance: 61.9 mL/min (A) (by C-G formula based on SCr of 0.54 mg/dL (L)). Liver Function Tests: Recent Labs  Lab 05/31/19 0500  AST 16  ALT 11  ALKPHOS 51  BILITOT 0.7  PROT 5.0*  ALBUMIN 2.3*   No results for input(s): LIPASE, AMYLASE in the last 168 hours. No results for input(s): AMMONIA in the last 168 hours. Coagulation Profile: Recent Labs  Lab 05/30/19 1718 05/31/19 0500  INR 1.1 1.2   Cardiac Enzymes: No results for input(s): CKTOTAL, CKMB, CKMBINDEX, TROPONINI in the last 168 hours. BNP (last 3 results) No results for input(s): PROBNP in the last 8760 hours. HbA1C: No results for input(s): HGBA1C in the last 72 hours. CBG: No results for input(s): GLUCAP in the last 168  hours. Lipid Profile: No results for input(s): CHOL, HDL, LDLCALC, TRIG, CHOLHDL, LDLDIRECT in the last 72 hours. Thyroid Function Tests: No results for input(s): TSH, T4TOTAL, FREET4, T3FREE, THYROIDAB in the last 72 hours. Anemia Panel: No results for input(s): VITAMINB12, FOLATE, FERRITIN, TIBC, IRON, RETICCTPCT in the last 72 hours. Urine analysis:    Component Value Date/Time   COLORURINE AMBER (A) 05/02/2019 1700   APPEARANCEUR CLOUDY (A) 05/02/2019 1700   LABSPEC 1.031 (H) 05/02/2019 1700   PHURINE 5.0 05/02/2019 1700   GLUCOSEU NEGATIVE 05/02/2019 1700   HGBUR NEGATIVE 05/02/2019 1700   BILIRUBINUR NEGATIVE 05/02/2019 1700   KETONESUR 5 (A) 05/02/2019 1700   PROTEINUR 30 (A) 05/02/2019 1700   NITRITE NEGATIVE 05/02/2019 1700   LEUKOCYTESUR NEGATIVE 05/02/2019 1700   Recent Results (from the past 240 hour(s))  MRSA PCR Screening     Status: None   Collection Time: 05/22/19  2:42 PM   Specimen: Nasopharyngeal  Result Value Ref Range Status   MRSA by PCR NEGATIVE NEGATIVE Final  Comment:        The GeneXpert MRSA Assay (FDA approved for NASAL specimens only), is one component of a comprehensive MRSA colonization surveillance program. It is not intended to diagnose MRSA infection nor to guide or monitor treatment for MRSA infections. Performed at [facility], [address], [place], [place] [address]       Radiology Studies: DG Chest Portable 1 View  Result Date: 05/30/2019 CLINICAL DATA:  Aspiration EXAM: PORTABLE CHEST 1 VIEW COMPARISON:  May 21, 2019 FINDINGS: Airspace opacities are again noted in the right mid lung zone and left lower lung zone. These are essentially unchanged from prior chest x-ray. There is no pneumothorax. No large pleural effusion. The heart size is stable. IMPRESSION: Again noted are findings of aspiration pneumonia, minimally improved from prior study. Electronically Signed   By: Katherine Mantle M.D.   On: 05/30/2019 17:44      Scheduled Meds: . amoxicillin-clavulanate  1 tablet Oral Q12H  . vitamin C  500 mg Oral Daily  . chlorhexidine  15 mL Mouth Rinse BID  . Chlorhexidine Gluconate Cloth  6 each Topical Daily  . divalproex  500 mg Oral QHS  . feeding supplement (ENSURE ENLIVE)  237 mL Oral TID BM  . loratadine  10 mg Oral Daily  . mouth rinse  15 mL Mouth Rinse q12n4p  . multivitamin with minerals  1 tablet Oral Daily  . pantoprazole  40 mg Oral Daily  . sertraline  25 mg Oral Daily  . sodium chloride flush  3 mL Intravenous Q12H  . sulfamethoxazole-trimethoprim  1 tablet Oral Q12H  . umeclidinium bromide  1 puff Inhalation Daily   Continuous Infusions:   LOS: 2 days   Time spent: 35 minutes.  Tyrone Nine, MD [facility] [email or web address] 06/01/2019, 2:03 PM

## 2019-06-01 NOTE — [place] Notes (Signed)
Initial Nutrition Assessment  DOCUMENTATION CODES:   Severe malnutrition in context of chronic illness, Underweight  INTERVENTION:  - will order Ensure Enlive TID, each supplement provides 350 kcal and 20 grams of protein. - will order Magic Cup TID with meals, each supplement provides 290 kcal and 9 grams of protein. - continue to encourage PO intakes.    NUTRITION DIAGNOSIS:   Severe Malnutrition related to chronic illness(COPD, hiatal hernia) as evidenced by severe fat depletion, severe muscle depletion.  GOAL:   Patient will meet greater than or equal to 90% of their needs  MONITOR:   PO intake, Supplement acceptance, Labs, Weight trends  REASON FOR ASSESSMENT:   Malnutrition Screening Tool, Consult Assessment of nutrition requirement/status, Poor PO  ASSESSMENT:   79 y.o. male with a history of chronic hypoxic respiratory failure d/t COPD, CAD, HTN, seizure disorder, and recent aspiration pneumonia complicated by hemoptysis. He presented to the ED from SNF due to nosebleed. He was hospitalized 2/26-3/7 for PNA and from 3/8-3/11 for recurrent aspiration PNA. It was then felt that aspiration was due to large hiatal hernia rather than due to dysphagia.  He consumed 100% of dinner last night (526 kcal, 15 grams protein). Family member is at bedside feeding patient lunch which consists of items she brought from [employer]. Patient is able to self feed but wanted someone to feed him today so family member agreed. Patient was seen by a RD at [facility] 1 month ago.   Family member reports that patient did not like the texture of dysphagia diet so he did not eat well while on it. He is able to chewing and swallow without difficulty and states that aspiration is d/t "stomach issues" rather than d/t dysphagia. She states that he eats well if he has foods he enjoys, such as what she brought from [employer].   He likes Ensure and [employer] and family member is very interested in these being ordered  for this admission.   Per chart review, weight on 3/18 was 127 lb, weight on 2/11 was 132 lb, and weight on 1/27 was 136 lb. This indicates 5 lb weight loss (3.8% body weight) in ~1 month; not significant for time frame. This indicates 9 lb weight loss (6.6% body weight) in the past ~2 months; significant for time frame.   Per notes: - acute on chronic respiratory failure with hypoxia d/t aspiration PNA, pneumonitis, and lung abscess with hx of COPD - acute blood loss anemia 2/2 epistaxis with R nostril packing until 3/23. - L BKA--from childhood - severe protein calorie malnutrition    Labs reviewed; creatinine: 0.54 mg/dl, Ca: 8.1 mg/dl. Medications reviewed; 500 mg ascorbic acid/day, daily multivitamin with minerals, 40 mg oral protonix/day.    NUTRITION - FOCUSED PHYSICAL EXAM:    Most Recent Value  Orbital Region  Moderate depletion  Upper Arm Region  Severe depletion  Thoracic and Lumbar Region  Severe depletion  Buccal Region  Severe depletion  Temple Region  Severe depletion  Clavicle Bone Region  Severe depletion  Clavicle and Acromion Bone Region  Severe depletion  Scapular Bone Region  Unable to assess  Dorsal Hand  Severe depletion  Patellar Region  Moderate depletion  Anterior Thigh Region  Unable to assess  Posterior Calf Region  Moderate depletion  Edema (RD Assessment)  None  Hair  Reviewed  Eyes  Reviewed  Mouth  Reviewed  Skin  Reviewed  Nails  Reviewed       Diet Order:  Diet Order            Diet regular Room service appropriate? Yes; Fluid consistency: Thin  Diet effective now              EDUCATION NEEDS:   No education needs have been identified at this time  Skin:  Skin Assessment: Skin Integrity Issues: Skin Integrity Issues:: Stage II Stage II: coccyx  Last BM:  3/19  Height:   Ht Readings from Last 1 Encounters:  05/31/19 5\' 11"  (1.803 m)    Weight:   Wt Readings from Last 1 Encounters:  05/31/19 57.5 kg    Ideal  Body Weight:  78.1 kg  BMI:  Body mass index is 17.69 kg/m.  Estimated Nutritional Needs:   Kcal:  2015-2240 kcal  Protein:  100-115 grams  Fluid:  >/= 2 L/day     11-14-1975, MS, RD, LDN, CNSC Inpatient Clinical Dietitian RD pager # available in AMION  After hours/weekend pager # available in [facility]

## 2019-06-01 NOTE — TOC Initial Note (Addendum)
Transition of Care [facility]) - Initial/Assessment Note    Patient Details  Name: Paul Suarez MRN: [id number] Date of Birth: 1940-08-25  Transition of Care [facility]) CM/SW Contact:    Clearance Coots, LCSW Phone Number: 06/01/2019, 12:39 PM  Clinical Narrative:  Braison Snoke is a 79 y.o. male with medical history significant for chronic hypoxic respiratory failure on 4 L continuous nasal cannula in setting of severe COPD, seizure disorder, anemia of chronic disease with baseline hemoglobin 7.5-9.0, and recent aspiration pneumonia, who is admitted to [facility] on 05/30/2019 with acute on chronic hypoxic resp failure after presenting from SNF to [facility] Emergency Department complaining of epistaxis of the right nare.           CSW reached to patient daughter introduce role. Daughter states the plan is for the patient to return to [facility]. Daughter reports the patient is active with Civil engineer, contracting. FL2 to be completed closer to discharge.   Expected Discharge Plan: Skilled Nursing Facility Barriers to Discharge: Continued Medical Work up   Patient Goals and CMS Choice   CMS Medicare.gov Compare Post Acute Care list provided to:: Other (Comment Required) Choice offered to / list presented to : NA  Expected Discharge Plan and Services Expected Discharge Plan: Skilled Nursing Facility In-house Referral: Clinical Social Work   Post Acute Care Choice: Skilled Nursing Facility Living arrangements for the past 2 months: Skilled Nursing Facility                                      Prior Living Arrangements/Services Living arrangements for the past 2 months: Skilled Nursing Facility Lives with:: Facility Resident Patient language and need for interpreter reviewed:: Yes Do you feel safe going back to the place where you live?: Yes      Need for Family Participation in Patient Care: No (Comment) Care giver support system in place?: Yes  (comment)   Criminal Activity/Legal Involvement Pertinent to Current Situation/Hospitalization: No - Comment as needed  Activities of Daily Living Home Assistive Devices/Equipment: Wheelchair, Oxygen ADL Screening (condition at time of admission) Patient's cognitive ability adequate to safely complete daily activities?: No Is the patient deaf or have difficulty hearing?: No Does the patient have difficulty seeing, even when wearing glasses/contacts?: No Does the patient have difficulty concentrating, remembering, or making decisions?: Yes Patient able to express need for assistance with ADLs?: Yes Does the patient have difficulty dressing or bathing?: Yes Independently performs ADLs?: No Communication: Independent Dressing (OT): Needs assistance Is this a change from baseline?: Pre-admission baseline Grooming: Needs assistance Is this a change from baseline?: Pre-admission baseline Feeding: Independent Bathing: Needs assistance Is this a change from baseline?: Pre-admission baseline Toileting: Needs assistance Is this a change from baseline?: Pre-admission baseline In/Out Bed: Dependent Is this a change from baseline?: Pre-admission baseline Walks in Home: Dependent Is this a change from baseline?: Pre-admission baseline Does the patient have difficulty walking or climbing stairs?: Yes Weakness of Legs: Right(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) Weakness of Arms/Hands: Both  Permission Sought/Granted Permission sought to share information with : Case Manager, Magazine features editor Permission granted to share information with : Yes, Verbal Permission Granted  Share Information with NAME: [name]  Permission granted to share info w AGENCY: SNF/ALF  Permission granted to share info w Relationship: Daughter  Permission granted to share info w Contact Information: [phone]  Emotional Assessment Appearance::  Appears stated age Attitude/Demeanor/Rapport: Unable to  Assess Affect (typically observed): Unable to Assess Orientation: : Oriented to Self, Oriented to Place Alcohol / Substance Use: Not Applicable Psych Involvement: No (comment)  Admission diagnosis:  Epistaxis [R04.0] Acute on chronic respiratory failure with hypoxia ([place]) [J96.21] Patient Active Problem List   Diagnosis Date Noted  . Epistaxis 05/31/2019  . COPD (chronic obstructive pulmonary disease) ([place]) 05/31/2019  . Abscess of lung with pneumonia ([place])   . Aspiration pneumonia ([place]) 05/21/2019  . Anemia of chronic disease 05/21/2019  . Protein-calorie malnutrition, severe 05/10/2019  . Pressure injury of skin 05/08/2019  . Staphylococcus aureus bacteremia 05/06/2019  . Severe sepsis ([place]) 05/02/2019  . Acute on chronic respiratory failure with hypoxia ([place]) 05/02/2019  . AKI (acute kidney injury) ([place]) 05/02/2019  . Acute blood loss anemia 05/02/2019  . Tachycardia 04/26/2019  . AMS (altered mental status) 02/13/2019  . UTI due to extended-spectrum beta lactamase (ESBL) producing Escherichia coli 02/12/2019  . CAD (coronary artery disease) 02/12/2019  . Essential hypertension 02/12/2019  . Vitamin D deficiency 02/12/2019  . Seasonal allergies 02/12/2019  . Muscle spasm/History of left BKA 02/12/2019  . Insomnia 02/12/2019  . COPD with acute exacerbation ([place]) 02/12/2019  . Major depression, recurrent ([place]) 02/12/2019  . Hypokalemia 02/12/2019  . Constipation 02/12/2019  . Seizure disorder ([place]) 02/12/2019   PCP:  Hendricks Limes, MD Pharmacy:   [facility] [place] - [address] AT [address] [place] [address] Phone: [phone] Fax: [phone]     Social Determinants of Health (SDOH) Interventions    Readmission Risk Interventions Readmission Risk Prevention Plan 05/22/2019  Transportation Screening Complete  PCP or Specialist Appt within 3-5 Days Complete  HRI or [place]  Complete  Social Work Consult for [place] Planning/Counseling Complete  Palliative Care Screening Complete  Medication Review Press photographer) Referral to Pharmacy

## 2019-06-02 LAB — TYPE AND SCREEN
ABO/RH(D): A POS
Antibody Screen: NEGATIVE
Unit division: 0
Unit division: 0

## 2019-06-02 LAB — BASIC METABOLIC PANEL
Anion gap: 6 (ref 5–15)
BUN: 9 mg/dL (ref 8–23)
CO2: 27 mmol/L (ref 22–32)
Calcium: 8.1 mg/dL — ABNORMAL LOW (ref 8.9–10.3)
Chloride: 103 mmol/L (ref 98–111)
Creatinine, Ser: 0.42 mg/dL — ABNORMAL LOW (ref 0.61–1.24)
GFR calc Af Amer: >60 mL/min (ref >60)
GFR calc non Af Amer: >60 mL/min (ref >60)
Glucose, Bld: 84 mg/dL (ref 70–99)
Potassium: 4.3 mmol/L (ref 3.5–5.1)
Sodium: 136 mmol/L (ref 135–145)

## 2019-06-02 LAB — BPAM RBC
Blood Product Expiration Date: 202104152359
Blood Product Expiration Date: 202104152359
ISSUE DATE / TIME: 202103181223
ISSUE DATE / TIME: 202103191229
Unit Type and Rh: 6200
Unit Type and Rh: 6200

## 2019-06-02 LAB — CBC
HCT: 27.6 % — ABNORMAL LOW (ref 39.0–52.0)
Hemoglobin: 8.7 g/dL — ABNORMAL LOW (ref 13.0–17.0)
MCH: 29.6 pg (ref 26.0–34.0)
MCHC: 31.5 g/dL (ref 30.0–36.0)
MCV: 93.9 fL (ref 80.0–100.0)
Platelets: 340 10*3/uL (ref 150–400)
RBC: 2.94 MIL/uL — ABNORMAL LOW (ref 4.22–5.81)
RDW: 16.2 % — ABNORMAL HIGH (ref 11.5–15.5)
WBC: 6 10*3/uL (ref 4.0–10.5)
nRBC: 0 % (ref 0.0–0.2)

## 2019-06-02 MED ORDER — SODIUM CHLORIDE 0.9 % IV SOLN: INTRAVENOUS | Status: AC

## 2019-06-02 NOTE — Evaluation (Signed)
Physical Therapy Evaluation Patient Details Name: Paul Suarez MRN: [id number] DOB: May 10, 1940 Today's Date: 06/02/2019   History of Present Illness  Paul Suarez is a 79 y.o. male with a history of chronic hypoxic respiratory failure due to COPD, CAD, HTN, seizure disorder, and recent aspiration pneumonia complicated by hemoptysis who presented from SNF for a nose bleed. Pt admitted for Acute on chronic hypoxemic respiratory failure due to aspiration pneumonia, pneumonitis, lung abscess and COPD  Clinical Impression  Pt admitted with above diagnosis.  Pt currently with functional limitations due to the deficits listed below (see PT Problem List). Pt will benefit from skilled PT to increase their independence and safety with mobility to allow discharge to the venue listed below.  Pt assisted to sitting EOB only due to currently requiring 15L HFNC.  Pt requiring max-total assist for bed mobility at this time and unable to maintain upright sitting posture without trunk support.  Recommend pt return to SNF.     Follow Up Recommendations SNF    Equipment Recommendations  None recommended by PT    Recommendations for Other Services       Precautions / Restrictions Precautions Precautions: Fall Precaution Comments: monitor O2 Restrictions Weight Bearing Restrictions: No      Mobility  Bed Mobility Overal bed mobility: Needs Assistance Bed Mobility: Supine to Sit;Sit to Supine     Supine to sit: Max assist;Total assist;+2 for physical assistance Sit to supine: Max assist;Total assist;+2 for physical assistance   General bed mobility comments: Assist for trunk elevation and BLE management; pt initiating movement however requiring assist to compete  Transfers                 General transfer comment: pt felt too weak and fatigued; SPO2 91% sitting EOB on 15L HFNC  Ambulation/Gait                Stairs            Wheelchair Mobility    Modified Rankin (Stroke  Patients Only)       Balance Overall balance assessment: Needs assistance Sitting-balance support: Bilateral upper extremity supported Sitting balance-Leahy Scale: Poor Sitting balance - Comments: Pt maxA for static sitting for posterior lean Postural control: Posterior lean                                   Pertinent Vitals/Pain Pain Assessment: No/denies pain    Home Living Family/patient expects to be discharged to:: Skilled nursing facility                 Additional Comments: [facility]    Prior Function Level of Independence: Needs assistance   Gait / Transfers Assistance Needed: Has not walked in 3 years, transfers to w/c independently and propels w/c at baseline. pt has required an increase in level of assistance since Dec. 2020  ADL's / Homemaking Assistance Needed: nursing staff at SNF assists with all ADL tasks and functional mobility         Hand Dominance   Dominant Hand: Right    Extremity/Trunk Assessment   Upper Extremity Assessment Upper Extremity Assessment: RUE deficits/detail;LUE deficits/detail RUE: (Grossly 3/5, AROM WFL, increased tone in L UE biceps ) LUE: (Grossly 3/5, AROM WFL, increased tone in L UE biceps )    Lower Extremity Assessment Lower Extremity Assessment: Generalized weakness RLE Deficits / Details: limited knee extension observed; pt tends to remain in  flexion LLE Deficits / Details: left BKA- since he was 79 years old. Used to wear a prosthesis but has not for the last few years. knee flexion contracture (approx 90*). Limited hip flexion AROM.    Cervical / Trunk Assessment Cervical / Trunk Assessment: Kyphotic  Communication   Communication: No difficulties  Cognition Arousal/Alertness: Awake/alert Behavior During Therapy: WFL for tasks assessed/performed Overall Cognitive Status: Impaired/Different from baseline Area of Impairment: Orientation;Following commands;Memory;Problem solving                  Orientation Level: Disoriented to;Situation;Time   Memory: Decreased short-term memory Following Commands: Follows one step commands with increased time;Follows one step commands inconsistently     Problem Solving: Slow processing;Decreased initiation;Difficulty sequencing;Requires verbal cues;Requires tactile cues        General Comments General comments (skin integrity, edema, etc.): R heel sore     Exercises     Assessment/Plan    PT Assessment Patient needs continued PT services  PT Problem List Decreased strength;Decreased activity tolerance;Cardiopulmonary status limiting activity;Decreased balance;Decreased knowledge of use of DME;Decreased mobility       PT Treatment Interventions Therapeutic activities;Functional mobility training;Balance training;Wheelchair mobility training;Patient/family education;Therapeutic exercise    PT Goals (Current goals can be found in the Care Plan section)  Acute Rehab PT Goals Patient Stated Goal: none stated PT Goal Formulation: With patient/family Time For Goal Achievement: 06/16/19 Potential to Achieve Goals: Fair    Frequency Min 2X/week   Barriers to discharge        Co-evaluation PT/OT/SLP Co-Evaluation/Treatment: Yes Reason for Co-Treatment: Complexity of the patient's impairments (multi-system involvement);For patient/therapist safety PT goals addressed during session: Mobility/safety with mobility OT goals addressed during session: ADL's and self-care       AM-PAC PT "6 Clicks" Mobility  Outcome Measure Help needed turning from your back to your side while in a flat bed without using bedrails?: A Lot Help needed moving from lying on your back to sitting on the side of a flat bed without using bedrails?: Total Help needed moving to and from a bed to a chair (including a wheelchair)?: Total Help needed standing up from a chair using your arms (e.g., wheelchair or bedside chair)?: Total Help needed to walk in  hospital room?: Total Help needed climbing 3-5 steps with a railing? : Total 6 Click Score: 7    End of Session Equipment Utilized During Treatment: Oxygen Activity Tolerance: Patient tolerated treatment well Patient left: with call bell/phone within reach;in bed;with bed alarm set;with family/visitor present   PT Visit Diagnosis: Muscle weakness (generalized) (M62.81)    Time: 1355-1420 PT Time Calculation (min) (ACUTE ONLY): 25 min   Charges:   PT Evaluation $PT Eval Moderate Complexity: 1 Mod         Kati PT, DPT Acute Rehabilitation Services Office: [phone]  Trena Platt 06/02/2019, 4:17 PM

## 2019-06-02 NOTE — Progress Notes (Signed)
Occupational Therapy Evaluation Patient Details Name: Paul Suarez MRN: [id number] DOB: April 04, 1940 Today's Date: 06/02/2019    History of Present Illness Paul Suarez is a 79 y.o. male with a history of chronic hypoxic respiratory failure due to COPD, CAD, HTN, seizure disorder, and recent aspiration pneumonia complicated by hemoptysis who presented from SNF for a nose bleed. dx of acute on chronic Respiratory Failure with hypoxia   Clinical Impression   Pt's daughter was present during OT/PT co-evaluation. Daughter verbalized that the patient presented to be more disoriented than baseline. Nursing staff was aware of concern. Pt required encouragement to participate in assessment resulting in assist x 2 for bed mobility at Max A and Moderate assist to support trunk while sitting unsupported EOB. O2 stats in the 90s on 15 L of HFNC. Pt required Min Assist for grooming at bed level and to don gown, but Moderate assist with Upper body bathing. Pt will benefit from continued skilled acute OT services to decrease level of caregiver assist upon d/c.     Follow Up Recommendations  SNF    Equipment Recommendations  None recommended by OT    Recommendations for Other Services       Precautions / Restrictions Precautions Precautions: Fall Restrictions Weight Bearing Restrictions: No      Mobility Bed Mobility Overal bed mobility: Needs Assistance Bed Mobility: Supine to Sit;Sit to Supine     Supine to sit: Max assist;Total assist Sit to supine: Max assist;Total assist   General bed mobility comments: Assist for trunk elevation and BLE management   Transfers                 General transfer comment: not able to accurately assess due to increase in O2 demands     Balance   Sitting-balance support: Bilateral upper extremity supported Sitting balance-Leahy Scale: Poor   Postural control: Posterior lean                                 ADL either performed or  assessed with clinical judgement   ADL Overall ADL's : Needs assistance/impaired Eating/Feeding: Set up;Bed level   Grooming: Minimal assistance;Bed level   Upper Body Bathing: Moderate assistance;Bed level   Lower Body Bathing: Total assistance;Cueing for sequencing   Upper Body Dressing : Minimal assistance;Bed level   Lower Body Dressing: Maximal assistance;Total assistance;Cueing for safety   Toilet Transfer: Total assistance   Toileting- Clothing Manipulation and Hygiene: Total assistance   Tub/ Shower Transfer: Total assistance;+2 for physical assistance;+2 for safety/equipment   Functional mobility during ADLs: Total assistance;+2 for physical assistance;+2 for safety/equipment       Vision Baseline Vision/History: No visual deficits       Perception     Praxis      Pertinent Vitals/Pain Pain Assessment: No/denies pain     Hand Dominance Right   Extremity/Trunk Assessment Upper Extremity Assessment Upper Extremity Assessment: RUE deficits/detail;LUE deficits/detail RUE: (Grossly 3/5, AROM WFL, increased tone in L UE biceps ) LUE: (Grossly 3/5, AROM WFL, increased tone in L UE biceps )   Lower Extremity Assessment Lower Extremity Assessment: Defer to PT evaluation   Cervical / Trunk Assessment Cervical / Trunk Assessment: Kyphotic   Communication Communication Communication: No difficulties   Cognition Arousal/Alertness: Awake/alert Behavior During Therapy: WFL for tasks assessed/performed Overall Cognitive Status: Impaired/Different from baseline Area of Impairment: Orientation;Following commands;Memory;Problem solving  Orientation Level: Disoriented to;Situation;Time   Memory: Decreased short-term memory Following Commands: Follows one step commands with increased time;Follows one step commands inconsistently     Problem Solving: Slow processing;Decreased initiation;Difficulty sequencing;Requires verbal cues;Requires  tactile cues     General Comments  R heel sore     Exercises     Shoulder Instructions      Home Living Family/patient expects to be discharged to:: Skilled nursing facility                                 Additional Comments: [facility]      Prior Functioning/Environment Level of Independence: Needs assistance  Gait / Transfers Assistance Needed: Has not walked in 3 years, transfers to w/c independently and propels w/c at baseline. pt has required an increase in level of assistance since Dec. 2020 ADL's / Homemaking Assistance Needed: nursing staff at SNF assists with all ADL tasks and functional mobility             OT Problem List: Decreased strength;Decreased activity tolerance;Impaired balance (sitting and/or standing);Decreased safety awareness;Pain      OT Treatment/Interventions: Self-care/ADL training;Therapeutic exercise;Energy conservation;DME and/or AE instruction;Therapeutic activities;Patient/family education;Balance training;Cognitive remediation/compensation    OT Goals(Current goals can be found in the care plan section) Acute Rehab OT Goals Patient Stated Goal: none stated OT Goal Formulation: Patient unable to participate in goal setting Time For Goal Achievement: 06/16/19 Potential to Achieve Goals: Fair ADL Goals Pt Will Perform Upper Body Dressing: sitting;with min assist Pt Will Transfer to Toilet: squat pivot transfer;bedside commode;with mod assist Pt Will Perform Toileting - Clothing Manipulation and hygiene: with max assist Pt/caregiver will Perform Home Exercise Program: Both right and left upper extremity;Increased strength;With Supervision  OT Frequency: Min 2X/week   Barriers to D/C:            Co-evaluation PT/OT/SLP Co-Evaluation/Treatment: Yes Reason for Co-Treatment: Complexity of the patient's impairments (multi-system involvement);For patient/therapist safety;To address functional/ADL transfers PT goals  addressed during session: Mobility/safety with mobility;Balance;Proper use of DME OT goals addressed during session: ADL's and self-care      AM-PAC OT "6 Clicks" Daily Activity     Outcome Measure Help from another person eating meals?: A Little Help from another person taking care of personal grooming?: A Little Help from another person toileting, which includes using toliet, bedpan, or urinal?: Total Help from another person bathing (including washing, rinsing, drying)?: Total Help from another person to put on and taking off regular upper body clothing?: A Lot Help from another person to put on and taking off regular lower body clothing?: Total 6 Click Score: 11   End of Session Equipment Utilized During Treatment: Oxygen Nurse Communication: Mobility status  Activity Tolerance: Patient limited by fatigue Patient left: in bed;with call bell/phone within reach;with bed alarm set;with family/visitor present  OT Visit Diagnosis: Unsteadiness on feet (R26.81);Muscle weakness (generalized) (M62.81)                Time: 7672-0947 OT Time Calculation (min): 27 min Charges:  OT General Charges $OT Visit: 1 Visit OT Evaluation $OT Eval Moderate Complexity: 1 Mod  Paul Suarez OTR/L   Paul Suarez 06/02/2019, 3:18 PM

## 2019-06-02 NOTE — Progress Notes (Signed)
PROGRESS NOTE  Paul Suarez  OEU:[id number] DOB: 03-18-1940 DOA: 05/30/2019 PCP: Hendricks Limes, MD   Brief Narrative: Paul Suarez is a 79 y.o. male with a history of chronic hypoxic respiratory failure due to COPD, CAD, HTN, seizure disorder, and recent aspiration pneumonia complicated by hemoptysis who presented from SNF for a nose bleed. He had been hospitalized 2/26 - 3/7 for pneumonia, then again 3/8 - 3/11 for recurrent aspiration pneumonia suspected to be due to large hiatal hernia (not dysphagia) complicated by lung abscess, ultimately discharged on augmentin and bactrim to end 4/1. He had hemoptysis during that admission and aspirin was held, with plavix continued. On admission here the right nare was packed, 1u PRBCs given and ENT consulted for recurrent bleeding which resolved with holding plavix and repacking. Hgb remained low so an additional unit of RBCs given 3/19.  Assessment & Plan: Principal Problem:   Acute on chronic respiratory failure with hypoxia (HCC) Active Problems:   Major depression, recurrent (HCC)   Seizure disorder (HCC)   Epistaxis   COPD (chronic obstructive pulmonary disease) (HCC)  Acute on chronic hypoxemic respiratory failure due to aspiration pneumonia, pneumonitis, lung abscess and COPD: Dyspnea worsened by anemia.  - Continue supplemental oxygen via mask, consider transition to humidified O2 once stabilizing and packing removed. - Continue antibiotics. Scheduled to stay on augmentin and bactrim through 06/14/2019.  - Had modified barium swallow study showing no aspiration. Aspiration thought to be related to reflux complicated by hiatal hernia. Continue PPI. Liberalized diet to improve po intake. - Continue bronchodilators.   Acute blood loss anemia on anemia of chronic disease: s/p 1u PRBCs 3/18, 1u PRBCs 3/19. INR 1.2. - Hgb improved and no further bleeding, but clinically tenuous and hgb 9.1 > 8.7 overnight. Will monitor CBC serially.  Hypotension: No  evidence of end organ dysfunction. Afebrile.  - This has remained stable and asymptomatic. Note recently low BPs in outpatient setting (BPs 90-100's/50-60's).  - No new bleeding, infection, or dehydration currently noted.  Epistaxis: Began rebleeding this afternoon. ENT consulted, replaced packing.  - Continue packing in right nare until at least Tuesday when this could be removed in ENT office vs. hospital if still admitted. - Holding plavix. Will not be restarting this. - Prn afrin.  - AVOID nasal cannula oxygen until packing is removed.  Sinus tachycardia: Noted in admission in February. TSH wnl. Did not improve with IV fluids and transfusions.  - Continue telemetry.  CAD: No chest pain.  - DC plavix. Note senile purpura, chronic anemia, and recurrent bleeding. Would favor not restarting antiplatelet or giving pharmacologic DVT prophylaxis.  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 Not due to PVD.  History of staphylococcal bacteremia: DC'ed 05/11/19 on IV vancomycin, completed 05/20/2019.   Cognitive impairment, weakness: Recent reported failure to thrive with repeated admissions, physical deconditioning.  - PT/OT.   Severe protein calorie malnutrition:  - Dietitian consulted.   Seizure disorder:  - Continue home depakote.   DVT prophylaxis: SCDs Code Status: DNR POA Family Communication: None at bedside this AM.  Disposition Plan: Patient from SNF, likely to return to SNF once epistaxis and anemia stabilized with stabilizing pulmonary status as well. He is stable for transfer to the floor today   Consultants:   ENT  Procedures:   Nasal packing by Dr. Constance Holster 3/18.  Antimicrobials:  Augmentin, bactrim through 4/1   Subjective: No bleeding over past 24 hours, denies any pain, shortness of breath.   Objective: Vitals:  06/02/19 1100 06/02/19 1144 06/02/19 1223 06/02/19 1303  BP: (!) 93/44 101/62    Pulse: (!) 114 (!) 110    Resp: 20 20 (!) 27 19  Temp:  98.4 F (36.9 C)     TempSrc:  Oral    SpO2: 94% 97%    Weight:      Height:        Intake/Output Summary (Last 24 hours) at 06/02/2019 1513 Last data filed at 06/02/2019 1100 Gross per 24 hour  Intake 1706.67 ml  Output 750 ml  Net 956.67 ml    Gen: Elderly male in no distress HEENT: Right nasal packing stable, left nare with crust but no active bleeding. Oropharynx appears normal.  Pulm: Nonlabored breathing with face mask on. Clear. CV: Regular tachycardia. No murmur, rub, or gallop. No JVD, no dependent edema. GI: Abdomen soft, non-tender, non-distended, with normoactive bowel sounds.  Ext: Warm, no new deformities Skin: No new rashes, lesions or ulcers on visualized skin. Ecchymoses stable Neuro: Alert and incompletely oriented, low volume voice without focal neurological deficits. Psych: Mood euthymic & affect congruent. Behavior is appropriate.    Data Reviewed: I have personally reviewed following labs and imaging studies  CBC: Recent Labs  Lab 05/30/19 1718 05/30/19 2230 05/31/19 0500 05/31/19 1732 06/01/19 0154 06/01/19 1730 06/02/19 0142  WBC 9.3  --  9.6  --  6.8  --  6.0  NEUTROABS 6.2  --  6.0  --   --   --   --   HGB 9.3*   < > 7.4* 8.5* 7.8* 9.1* 8.7*  HCT 30.3*   < > 24.2* 27.9* 25.0* 29.2* 27.6*  MCV 98.4  --  98.8  --  96.2  --  93.9  PLT 424*  --  356  --  319  --  340   < > = values in this interval not displayed.   Basic Metabolic Panel: Recent Labs  Lab 05/30/19 1718 05/31/19 0500 06/01/19 0154 06/02/19 0142  NA 137 138 136 136  K 4.2 4.9 4.3 4.3  CL 104 108 103 103  CO2 25 27 27 27   GLUCOSE 132* 96 123* 84  BUN 27* 32* 18 9  CREATININE 0.66 0.47* 0.54* 0.42*  CALCIUM 8.4* 7.6* 8.1* 8.1*  MG 2.0 1.8  --   --    Liver Function Tests: Recent Labs  Lab 05/31/19 0500  AST 16  ALT 11  ALKPHOS 51  BILITOT 0.7  PROT 5.0*  ALBUMIN 2.3*   Coagulation Profile: Recent Labs  Lab 05/30/19 1718 05/31/19 0500  INR 1.1 1.2   Urine analysis:      Component Value Date/Time   COLORURINE AMBER (A) 05/02/2019 1700   APPEARANCEUR CLOUDY (A) 05/02/2019 1700   LABSPEC 1.031 (H) 05/02/2019 1700   PHURINE 5.0 05/02/2019 1700   GLUCOSEU NEGATIVE 05/02/2019 1700   HGBUR NEGATIVE 05/02/2019 1700   BILIRUBINUR NEGATIVE 05/02/2019 1700   KETONESUR 5 (A) 05/02/2019 1700   PROTEINUR 30 (A) 05/02/2019 1700   NITRITE NEGATIVE 05/02/2019 1700   LEUKOCYTESUR NEGATIVE 05/02/2019 1700   No results found for this or any previous visit (from the past 240 hour(s)).    Radiology Studies: No results found.  Scheduled Meds: . amoxicillin-clavulanate  1 tablet Oral Q12H  . vitamin C  500 mg Oral Daily  . chlorhexidine  15 mL Mouth Rinse BID  . Chlorhexidine Gluconate Cloth  6 each Topical Daily  . divalproex  500 mg Oral QHS  .  feeding supplement (ENSURE ENLIVE)  237 mL Oral TID BM  . loratadine  10 mg Oral Daily  . mouth rinse  15 mL Mouth Rinse q12n4p  . multivitamin with minerals  1 tablet Oral Daily  . pantoprazole  40 mg Oral Daily  . sertraline  25 mg Oral Daily  . sodium chloride flush  3 mL Intravenous Q12H  . sulfamethoxazole-trimethoprim  1 tablet Oral Q12H  . umeclidinium bromide  1 puff Inhalation Daily   Continuous Infusions: . sodium chloride 100 mL/hr at 06/02/19 1100     LOS: 3 days   Time spent: 35 minutes.  Tyrone Nine, MD [facility] [email or web address] 06/02/2019, 3:13 PM

## 2019-06-03 LAB — CBC
HCT: 26.1 % — ABNORMAL LOW (ref 39.0–52.0)
Hemoglobin: 7.9 g/dL — ABNORMAL LOW (ref 13.0–17.0)
MCH: 28.7 pg (ref 26.0–34.0)
MCHC: 30.3 g/dL (ref 30.0–36.0)
MCV: 94.9 fL (ref 80.0–100.0)
Platelets: 380 10*3/uL (ref 150–400)
RBC: 2.75 MIL/uL — ABNORMAL LOW (ref 4.22–5.81)
RDW: 16.2 % — ABNORMAL HIGH (ref 11.5–15.5)
WBC: 6.7 10*3/uL (ref 4.0–10.5)
nRBC: 0 % (ref 0.0–0.2)

## 2019-06-03 LAB — URINALYSIS, ROUTINE W REFLEX MICROSCOPIC
Bilirubin Urine: NEGATIVE
Glucose, UA: NEGATIVE mg/dL
Hgb urine dipstick: NEGATIVE
Ketones, ur: NEGATIVE mg/dL
Leukocytes,Ua: NEGATIVE
Nitrite: NEGATIVE
Protein, ur: NEGATIVE mg/dL
Specific Gravity, Urine: 1.013 (ref 1.005–1.030)
pH: 6 (ref 5.0–8.0)

## 2019-06-03 LAB — BASIC METABOLIC PANEL
Anion gap: 6 (ref 5–15)
BUN: 10 mg/dL (ref 8–23)
CO2: 29 mmol/L (ref 22–32)
Calcium: 8.1 mg/dL — ABNORMAL LOW (ref 8.9–10.3)
Chloride: 102 mmol/L (ref 98–111)
Creatinine, Ser: 0.49 mg/dL — ABNORMAL LOW (ref 0.61–1.24)
GFR calc Af Amer: >60 mL/min (ref >60)
GFR calc non Af Amer: >60 mL/min (ref >60)
Glucose, Bld: 88 mg/dL (ref 70–99)
Potassium: 4.5 mmol/L (ref 3.5–5.1)
Sodium: 137 mmol/L (ref 135–145)

## 2019-06-03 MED ORDER — LORAZEPAM 0.5 MG PO TABS: 0.5000 mg | ORAL_TABLET | Freq: Four times a day (QID) | ORAL | Status: DC | PRN

## 2019-06-03 MED ORDER — SALINE SPRAY 0.65 % NA SOLN
1.0000 | Freq: Two times a day (BID) | NASAL | Status: DC
  Administered 2019-06-03 – 2019-06-06 (×6): 1 via NASAL
  Filled 2019-06-03: qty 44

## 2019-06-03 NOTE — Progress Notes (Signed)
PROGRESS NOTE  Paul Suarez  [name]:[id number] DOB: 08/11/1940 DOA: 05/30/2019 PCP: Hendricks Limes, MD   Brief Narrative: Paul Suarez is a 79 y.o. male with a history of chronic hypoxic respiratory failure due to COPD, CAD, HTN, seizure disorder, and recent aspiration pneumonia complicated by hemoptysis who presented from SNF for a nose bleed. He had been hospitalized 2/26 - 3/7 for pneumonia, then again 3/8 - 3/11 for recurrent aspiration pneumonia suspected to be due to large hiatal hernia (not dysphagia) complicated by lung abscess, ultimately discharged on augmentin and bactrim to end 4/1. He had hemoptysis during that admission and aspirin was held, with plavix continued. On admission here the right nare was packed, 1u PRBCs given and ENT consulted for recurrent bleeding which resolved with holding plavix and repacking. Hgb remained low so an additional unit of RBCs given 3/19.  Assessment & Plan: Principal Problem:   Acute on chronic respiratory failure with hypoxia (HCC) Active Problems:   Major depression, recurrent (HCC)   Seizure disorder (HCC)   Epistaxis   COPD (chronic obstructive pulmonary disease) (HCC)  Acute on chronic hypoxemic respiratory failure due to aspiration pneumonia, pneumonitis, lung abscess and COPD: Dyspnea worsened by anemia.  - Continue supplemental oxygen via mask, consider transition to humidified O2 once stabilizing and packing removed. - Continue antibiotics. Scheduled to stay on augmentin and bactrim through 06/14/2019.  - Had modified barium swallow study showing no aspiration. Aspiration thought to be related to reflux complicated by hiatal hernia. Continue PPI. Liberalized diet to improve po intake. - Continue bronchodilators.   Acute blood loss anemia on anemia of chronic disease: s/p 1u PRBCs 3/18, 1u PRBCs 3/19. INR 1.2. - No further bleeding, but hgb continues slow downward trend, now 7.9g/dl. Will monitor CBC serially.  Hypotension: No evidence of end  organ dysfunction. Afebrile.  - This has remained stable and asymptomatic, improving. Note recently low BPs in outpatient setting (BPs 90-100's/50-60's).  - No new bleeding, infection, or dehydration currently noted.  Epistaxis: Began rebleeding this afternoon. ENT consulted, replaced packing.  - Continue packing in right nare until at least Tuesday when this could be removed in ENT office vs. hospital if still admitted. - Holding plavix. Will not be restarting this. - Prn afrin. Can use nasal saline on left nare with irritation but no bleeding.  - AVOID nasal cannula oxygen until packing is removed.  Sinus tachycardia: Noted in admission in February. TSH wnl. Did not improve with IV fluids and transfusions. Does seem to improve while sleeping.  - Continue telemetry. - Reportedly taking plenty of fluids, UOP wnl. Will not restart IVF. - D/w daughter who reports the patient is quite anxious. We discussed risks (namely respiratory depression) and benefits of very low dose anxiolytic which we will proceed with on an as needed basis.   CAD: No chest pain.  - DC plavix. Note senile purpura, chronic anemia, and recurrent bleeding. Would favor not restarting antiplatelet or giving pharmacologic DVT prophylaxis.  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 Not due to PVD.  History of staphylococcal bacteremia: DC'ed 05/11/19 on IV vancomycin, completed 05/20/2019.   Cognitive impairment, weakness: Recent reported failure to thrive with repeated admissions, physical deconditioning.  - PT/OT.  - Delirium precautions  Severe protein calorie malnutrition:  - Dietitian consulted.   Seizure disorder:  - Continue home depakote.   Sacral pressure injury stage 1, POA:  - WOC consulted - Offload as able  History of ESBL UTI:  - Contact precautions per infection  prevention.  - Check UA with incompletely explained vital sign abnormalities and inability (due to confusion) to report urinary symptoms  reliably.  DVT prophylaxis: SCDs Code Status: DNR POA Family Communication: None at bedside this AM. Came back and spoke with daughter at bedside. Disposition Plan: Patient from SNF, likely to return to SNF once epistaxis and anemia stabilized with stabilizing pulmonary status as well.    Consultants:   ENT  Procedures:   Nasal packing by Dr. Pollyann Kennedy 3/18.  Antimicrobials:  Augmentin, bactrim through 4/1   Subjective: Denies any bleeding, no hemoptysis or epistaxis. No hematuria or blood in stool. Does drink ensure regularly but not eating much. Takes plenty of water, milk. No fever.  Objective: Vitals:   06/02/19 1758 06/02/19 2059 06/03/19 0500 06/03/19 0613  BP:  116/71  114/68  Pulse:  (!) 114  (!) 102  Resp:  19  19  Temp:  98.9 F (37.2 C)  98.4 F (36.9 C)  TempSrc:  Oral  Oral  SpO2: 97% 97%  97%  Weight:   60.5 kg   Height:        Intake/Output Summary (Last 24 hours) at 06/03/2019 1154 Last data filed at 06/03/2019 0616 Gross per 24 hour  Intake --  Output 1325 ml  Net -1325 ml    Gen: 79 y.o. male in no distress HEENT: Right nare packing is without bleeding, in place. Left nare has crusting with darkness but no red blood. Oropharynx not noted to have blood. Pulm: Nonlabored breathing with mask. Clear. CV: Regular tachycardia. No murmur, rub, or gallop. No JVD, no dependent edema. GI: Abdomen soft, non-tender, non-distended, with normoactive bowel sounds.  Ext: Warm, no deformities Skin: No new rashes, lesions or ulcers on visualized skin. Sacrum not visualized this morning. Neuro: Alert and not oriented. No new focal neurological deficits. Psych: Judgement and insight appear impaired. Mood euthymic & affect congruent. Behavior is appropriate.     Data Reviewed: I have personally reviewed following labs and imaging studies  CBC: Recent Labs  Lab 05/30/19 1718 05/30/19 2230 05/31/19 0500 05/31/19 0500 05/31/19 1732 06/01/19 0154 06/01/19 1730  06/02/19 0142 06/03/19 0336  WBC 9.3  --  9.6  --   --  6.8  --  6.0 6.7  NEUTROABS 6.2  --  6.0  --   --   --   --   --   --   HGB 9.3*   < > 7.4*   < > 8.5* 7.8* 9.1* 8.7* 7.9*  HCT 30.3*   < > 24.2*   < > 27.9* 25.0* 29.2* 27.6* 26.1*  MCV 98.4  --  98.8  --   --  96.2  --  93.9 94.9  PLT 424*  --  356  --   --  319  --  340 380   < > = values in this interval not displayed.   Basic Metabolic Panel: Recent Labs  Lab 05/30/19 1718 05/31/19 0500 06/01/19 0154 06/02/19 0142 06/03/19 0336  NA 137 138 136 136 137  K 4.2 4.9 4.3 4.3 4.5  CL 104 108 103 103 102  CO2 25 27 27 27 29   GLUCOSE 132* 96 123* 84 88  BUN 27* 32* 18 9 10   CREATININE 0.66 0.47* 0.54* 0.42* 0.49*  CALCIUM 8.4* 7.6* 8.1* 8.1* 8.1*  MG 2.0 1.8  --   --   --    Liver Function Tests: Recent Labs  Lab 05/31/19 0500  AST 16  ALT 11  ALKPHOS 51  BILITOT 0.7  PROT 5.0*  ALBUMIN 2.3*   Coagulation Profile: Recent Labs  Lab 05/30/19 1718 05/31/19 0500  INR 1.1 1.2   Urine analysis:    Component Value Date/Time   COLORURINE AMBER (A) 05/02/2019 1700   APPEARANCEUR CLOUDY (A) 05/02/2019 1700   LABSPEC 1.031 (H) 05/02/2019 1700   PHURINE 5.0 05/02/2019 1700   GLUCOSEU NEGATIVE 05/02/2019 1700   HGBUR NEGATIVE 05/02/2019 1700   BILIRUBINUR NEGATIVE 05/02/2019 1700   KETONESUR 5 (A) 05/02/2019 1700   PROTEINUR 30 (A) 05/02/2019 1700   NITRITE NEGATIVE 05/02/2019 1700   LEUKOCYTESUR NEGATIVE 05/02/2019 1700   No results found for this or any previous visit (from the past 240 hour(s)).    Radiology Studies: No results found.  Scheduled Meds: . amoxicillin-clavulanate  1 tablet Oral Q12H  . vitamin C  500 mg Oral Daily  . chlorhexidine  15 mL Mouth Rinse BID  . Chlorhexidine Gluconate Cloth  6 each Topical Daily  . divalproex  500 mg Oral QHS  . feeding supplement (ENSURE ENLIVE)  237 mL Oral TID BM  . loratadine  10 mg Oral Daily  . mouth rinse  15 mL Mouth Rinse q12n4p  . multivitamin  with minerals  1 tablet Oral Daily  . pantoprazole  40 mg Oral Daily  . sertraline  25 mg Oral Daily  . sodium chloride flush  3 mL Intravenous Q12H  . sulfamethoxazole-trimethoprim  1 tablet Oral Q12H  . umeclidinium bromide  1 puff Inhalation Daily   Continuous Infusions:    LOS: 4 days   Time spent: 35 minutes.  Tyrone Nine, MD [facility] [email or web address] 06/03/2019, 11:54 AM

## 2019-06-03 NOTE — Consult Note (Signed)
WOC Nurse Consult Note: Reason for Consult:Pressure and moisture related skin injuries Wound type: Pressure and moisture Pressure Injury POA: Yes, all Measurement: Stage 2 pressure injuries to the right hip, right ischial tuberosity and right stump (Partial thickness): -Right hip measures 1cm x 2.5cm x 0.1cm and presents over a previously healed skin injury. Pink moist wound bed, scant serous drainage. -Right ischial tuberosity measures 1cm round x 0.2cm and is pressure confounded by moisture.  Moist pink wound bed, scant serous drainage. -Right stump measures 1cm x 3cm x 0.1cm and presents as moist, pink denuded area of pressure confounded by friction. No exudate. Unstageable PI to the right heel:  1cm x 2cm with depth unable to be determined due to the presence of nonviable sloughing skin.  Patient reports tenderness in this area. DTPI to the sacrum:  Intact, but slow-to-blanch area with purple discoloration noted in the sacral area measuring 2cm x 3cm.  Wound bed:As noted above Drainage (amount, consistency, odor) As noted above Periwound:As noted above Dressing procedure/placement/frequency: I will provide the patient with a mattress replacement to mitigate pressure, moisture and friction. Guidance is provided for the Nursing team via the orders but is already in place-silicone foam dressings to the affected areas, house skin care products for incontinence care.  Patient is incontinent of soft green stool in a large amount at the time of my assessment. Note: Patient requires HOB elevation above a 30 degree angle for respiratory purposes.  [facility] is lowered as able during periods of comfort and rest, but must be elevated at patient's request. I will also provide a pressure redistribution heel boot to the right heel.  WOC nursing team will not follow routinely, but will remain available to this patient, the nursing and medical teams.  Please re-consult if needed. Thanks, Ladona Mow, MSN,  RN, GNP, [facility]  Pager# [phone]

## 2019-06-04 ENCOUNTER — Inpatient Hospital Stay (HOSPITAL_COMMUNITY): Payer: Medicare Other

## 2019-06-04 DIAGNOSIS — Z7189 Other specified counseling: Secondary | ICD-10-CM

## 2019-06-04 DIAGNOSIS — R0602 Shortness of breath: Secondary | ICD-10-CM

## 2019-06-04 DIAGNOSIS — Z515 Encounter for palliative care: Secondary | ICD-10-CM

## 2019-06-04 DIAGNOSIS — J851 Abscess of lung with pneumonia: Secondary | ICD-10-CM

## 2019-06-04 LAB — CBC
HCT: 29.1 % — ABNORMAL LOW (ref 39.0–52.0)
Hemoglobin: 8.9 g/dL — ABNORMAL LOW (ref 13.0–17.0)
MCH: 29.5 pg (ref 26.0–34.0)
MCHC: 30.6 g/dL (ref 30.0–36.0)
MCV: 96.4 fL (ref 80.0–100.0)
Platelets: 451 10*3/uL — ABNORMAL HIGH (ref 150–400)
RBC: 3.02 MIL/uL — ABNORMAL LOW (ref 4.22–5.81)
RDW: 16.3 % — ABNORMAL HIGH (ref 11.5–15.5)
WBC: 8.7 10*3/uL (ref 4.0–10.5)
nRBC: 0 % (ref 0.0–0.2)

## 2019-06-04 LAB — BASIC METABOLIC PANEL
Anion gap: 6 (ref 5–15)
BUN: 15 mg/dL (ref 8–23)
CO2: 31 mmol/L (ref 22–32)
Calcium: 8.5 mg/dL — ABNORMAL LOW (ref 8.9–10.3)
Chloride: 100 mmol/L (ref 98–111)
Creatinine, Ser: 0.56 mg/dL — ABNORMAL LOW (ref 0.61–1.24)
GFR calc Af Amer: >60 mL/min (ref >60)
GFR calc non Af Amer: >60 mL/min (ref >60)
Glucose, Bld: 92 mg/dL (ref 70–99)
Potassium: 5.2 mmol/L — ABNORMAL HIGH (ref 3.5–5.1)
Sodium: 137 mmol/L (ref 135–145)

## 2019-06-04 MED ORDER — MORPHINE SULFATE (CONCENTRATE) 10 MG/0.5ML PO SOLN: 5.0000 mg | ORAL | Status: DC | PRN

## 2019-06-04 MED ORDER — MORPHINE SULFATE (PF) 2 MG/ML IV SOLN
1.0000 mg | INTRAVENOUS | Status: DC | PRN
  Administered 2019-06-04: 1 mg via INTRAVENOUS
  Filled 2019-06-04: qty 1

## 2019-06-04 MED ORDER — SODIUM ZIRCONIUM CYCLOSILICATE 5 G PO PACK
5.0000 g | PACK | Freq: Every day | ORAL | Status: AC
  Administered 2019-06-04 – 2019-06-05 (×2): 5 g via ORAL
  Filled 2019-06-04 (×2): qty 1

## 2019-06-04 NOTE — Evaluation (Signed)
Clinical/Bedside Swallow Evaluation Patient Details  Name: Paul Suarez MRN: [id number] Date of Birth: 22-Mar-1940  Today's Date: 06/04/2019 Time: SLP Start Time (ACUTE ONLY): 1249 SLP Stop Time (ACUTE ONLY): 1322 SLP Time Calculation (min) (ACUTE ONLY): 33 min  Past Medical History:  Past Medical History:  Diagnosis Date  . CAD (coronary artery disease)   . COPD (chronic obstructive pulmonary disease) (HCC)   . Depression   . Essential hypertension   . Seizure disorder (HCC)   . Staphylococcus aureus bacteremia 05/06/2019   Past Surgical History:  Past Surgical History:  Procedure Laterality Date  . BELOW KNEE LEG AMPUTATION Left    HPI:  Pt is a 79 y.o. gentleman with several recent hospitalization for pna, now admit with epistaxis .  PMH + for history of CAD and PVD s/p BKA, Severe centrilobular emphysema on home oxygen 2LNC, who was previously diagnosed with COVID 19 pneumonia. Has a history of recurrent resistant ESBL producing organisms. Recent outpatient anitbiotics courses for concern for post-covid bacterial pneumonia. Patient was discharged from the hospital after treatment for staphylococcal sepsis secondary to pneumonia.  He was discharged back to the skilled nursing facility([facility]) with IV vancomycin and completed dose of antibiotic on 05/20/19.  CT of CHEST 05/21/2019: "CT scan of the chest which showed Large hiatal hernia with diffuse esophageal thickening potentially related to esophagitis, aspiration pneumonia, post infectious abscess/pneumatocele, pulmonary emphysema with some ground-glass presentation".  Pt admits to coughing and choking with epistaxis.  Given thickening in esophagus, ? if he may also have some esophageal dysmotlity.   Assessment / Plan / Recommendation Clinical Impression  Pt appeared to demonstrate grossly adequate oropharyngeal phase swallow function w/ no immediate clinical s/s of aspiration or decline in status w/ trials given at evaluation today. Delayed  cough noted with po. Limited trials given due to pt requiring facemask oxygen and his lack of desire to eat.  Pt with baseline cough without intake - likey due to his COPD. Prior MBS Feb 2021 inpt showed no laryngeal penetration or aspiration.  Pt was placed on a regular diet w/ thin liquids w/ aspiration precautions at that time and daughter confirms to continue this diet. She advises she is ordering softer foods for pt to consume per her statement.  Today pt with no wet vocal quality but his voice is weaker than normal per daughter.  Encouraged pt to use reverse trendelenburg for po, start intake with water and consume liquids t/o meal.  Pt is edentulous. Advised pt and daughter Christyl to diet recommendations and provided compensation strategies for eating/drinking with dyspnea, esophageal precautions.  General aspiration precautions. REFLUX precautions.  Recommend consider palliative consult to establish GOC given recurrent hospital admit x3 within a short amount of time.  Today pt is requiring high level of oxygen via face mask.  .Advised pt and daughter to episodic aspiration if not synchronous with swallow/respirations and advised small frequent meals.  Mult-factorial aspiration risk present due to recurrent epistaxis, dysphagia and dyspnea with intake.  No follow up needed as all education completed to mitigate risk. 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)    Aspiration Risk  Mild aspiration risk;Risk for inadequate nutrition/hydration(reduced following precautions)    Diet Recommendation Regular;Thin liquid(daughter wanted regular/thin diet - but pt still not eating adequately)   Liquid Administration via: Cup;Straw Medication Administration: Whole meds with puree(for safer swallowing) Supervision: Staff to assist with self feeding Compensations: Slow rate;Small sips/bites;Other (Comment)(start intake with liquids) Postural  Changes: Seated upright  at 90 degrees;Remain upright for at least 30 minutes after po intake    Other  Recommendations Recommended Consults: Consider GI evaluation;Consider esophageal assessment(Dietician f/u; Palliative Care f/u for [place]) Oral Care Recommendations: Oral care before and after PO;Staff/trained caregiver to provide oral care;Oral care BID Other Recommendations: (n/a)   Follow up Recommendations None      Frequency and Duration (n/a)  (n/a)       Prognosis Prognosis for Safe Diet Advancement: (-Good)      Swallow Study   General Date of Onset: 05/21/19 HPI: Pt is a 79 y.o. gentleman with several recent hospitalization for pna, now admit with epistaxis .  PMH + for history of CAD and PVD s/p BKA, Severe centrilobular emphysema on home oxygen 2LNC, who was previously diagnosed with COVID 19 pneumonia. Has a history of recurrent resistant ESBL producing organisms. Recent outpatient anitbiotics courses for concern for post-covid bacterial pneumonia. Patient was discharged from the hospital after treatment for staphylococcal sepsis secondary to pneumonia.  He was discharged back to the skilled nursing facility([facility]) with IV vancomycin and completed dose of antibiotic on 05/20/19.  CT of CHEST 05/21/2019: "CT scan of the chest which showed Large hiatal hernia with diffuse esophageal thickening potentially related to esophagitis, aspiration pneumonia, post infectious abscess/pneumatocele, pulmonary emphysema with some ground-glass presentation".  Pt admits to coughing and choking with epistaxis.  Given thickening in esophagus, ? if he may also have some esophageal dysmotlity. Previous Swallow Assessment: BSE 05/22/2019, BSE 05/07/2019;; MBSS 05/08/2019; DtrR reports hx of puree diet with weight loss in the past Diet Prior to this Study: Regular;Thin liquids Respiratory Status: Nasal cannula(4L) History of Recent Intubation: No Behavior/Cognition: Alert;Cooperative;Pleasant  mood;Distractible;Requires cueing Oral Care Completed by SLP: No Oral Cavity - Dentition: Edentulous Vision: Impaired for self-feeding Self-Feeding Abilities: Needs assist Patient Positioning: (needs positioning support, reverse trendelenburg) Baseline Vocal Quality: Normal;Wet(intermittent w/ own saliva) Volitional Cough: Weak(adequate but cognitively did not follow through well) Volitional Swallow: Unable to elicit(cognitive)    Oral/Motor/Sensory Function Overall Oral Motor/Sensory Function: Within functional limits(grossly from prior eval)   Ice Chips Ice chips: Not tested   Thin Liquid Presentation: Straw Pharyngeal  Phase Impairments: Cough - Delayed    Nectar Thick Nectar Thick Liquid: Impaired Presentation: Straw Pharyngeal Phase Impairments: Cough - Delayed   Honey Thick Honey Thick Liquid: Not tested   Puree Puree: Not tested   Solid     Solid: Not tested     Paul Lime, MS [facility] [phone]  Paul Suarez 06/04/2019,2:11 PM

## 2019-06-04 NOTE — Care Management Important Message (Signed)
Important Message  Patient Details IM Letter given to Cookie Windell Moulding SW Case Manager to present to the Patient Name: Brydon Spahr MRN: [id number] Date of Birth: 18-Feb-1941   Medicare Important Message Given:  Yes     Caren Macadam 06/04/2019, 12:49 PM

## 2019-06-04 NOTE — Consult Note (Signed)
Consultation Note Date: 06/04/2019   Patient Name: Paul Suarez  DOB: [birth year]  MRN: [id number]  Age / Sex: 79 y.o., male  PCP: Hendricks Limes, MD Referring Physician: Patrecia Pour, MD  Reason for Consultation: Establishing goals of care  HPI/Patient Profile: 79 y.o. male  admitted on 05/30/2019    [name] is a 79 y.o. male with a history of chronic hypoxic respiratory failure due to COPD, CAD, HTN, seizure disorder, and recent aspiration pneumonia complicated by hemoptysis who presented from SNF for a nose bleed. He had been hospitalized 2/26 - 3/7 for pneumonia, then again 3/8 - 3/11 for recurrent aspiration pneumonia suspected to be due to large hiatal hernia (not dysphagia) complicated by lung abscess, ultimately discharged on augmentin and bactrim to end 4/1. He had hemoptysis during that admission and aspirin was held, with plavix continued. On admission this time, the right nare was packed, 1u PRBCs given and ENT consulted for recurrent bleeding which resolved with holding plavix and repacking. Hgb remained low so an additional unit of RBCs given 3/19.  Clinical Assessment and Goals of Care:  Patient's oxygen requirements continue to be high, he has ongoing functional decline, cognitive decline. PO intake is low, ongoing risk of aspiration continues. Deemed to have high risk of ongoing decline and debility.   A palliative consult has been requested for ongoing goals of care discussions.  [name] is resting in bed, he has O2 on. He is confused. Answers a few questions appropriately, lunch tray untouched currently, has secretions and appears with coarse breath sounds when he coughs. Not able to clear his secretions, appears weak. Daughter [name] is at bedside.   I introduced myself and palliative care as follows: Palliative medicine is specialized medical care for people living with serious illness.  It focuses on providing relief from the symptoms and stress of a serious illness. The goal is to improve quality of life for both the patient and the family.  Goals of care: Broad aims of medical therapy in relation to the patient's values and preferences. Our aim is to provide medical care aimed at enabling patients to achieve the goals that matter most to them, given the circumstances of their particular medical situation and their constraints.   I met with the patient's daughter outside his room. A family meeting was held. Goals, wishes and values attempted to be explored. We reviewed scope of current hospitalization. Concept of aspiration discussed in detail. Discussed about lung abscess, recurrent PNA, ongoing frailty and deconditioning.   Brief life review also performed. Patient is divorced from his wife, has 4 children who were all estranged from him. He has had PNA and recurrent hospitalizations along with ongoing decline for the past 2-3 months.   We discussed about symptom management, addition of opioids for relief of dyspnea and air hunger. Another daughter [name] also participated by phone. We talked about gentle medical treatment, no escalation of care and trying to figure out appropriate disposition. To this end, discussed about hospice philosophy of care,  specifically residential hospice. Comfort measures option was also discussed.   All of the family's questions answered to the best of my ability. See below.   NEXT OF KIN  daughter [name].  Has another daughter [name], currently in [place], will arrive in town soon.  Has a son and another daughter.  Is divorced from his wife.   SUMMARY OF RECOMMENDATIONS    Agree with DNR DNI.  Add low dose PO and IV PRN Morphine for symptom management.  Continue current mode of care for now.  Additional family (son and daughter) arriving tomorrow 06-05-19. They plan to discuss further amongst themselves regarding next steps, overall goals of care  and about recommendations for hospice.  Recommend consideration for transfer to residential hospice and focus on comfort measures and aggressive symptom management towards the end of this hospitalization. Daughter [name] at bedside is in agreement. PMT to follow along.  Thank you for the consult.   Code Status/Advance Care Planning:  DNR    Symptom Management:    as above   Palliative Prophylaxis:   Delirium Protocol     Psycho-social/Spiritual:   Desire for further Chaplaincy support:yes  Additional Recommendations: Education on Hospice  Prognosis:   < 2 weeks  Discharge Planning: To Be Determined      Primary Diagnoses: Present on Admission: . Acute on chronic respiratory failure with hypoxia ([employer]) . Epistaxis . Major depression, recurrent ([place]) . COPD (chronic obstructive pulmonary disease) ([place]) . Acute blood loss anemia . AMS (altered mental status) . Aspiration pneumonia ([place]) . CAD (coronary artery disease) . Essential hypertension . Pressure injury of skin . Protein-calorie malnutrition, severe   I have reviewed the medical record, interviewed the patient and family, and examined the patient. The following aspects are pertinent.  Past Medical History:  Diagnosis Date  . CAD (coronary artery disease)   . COPD (chronic obstructive pulmonary disease) ([place])   . Depression   . Essential hypertension   . Seizure disorder ([place])   . Staphylococcus aureus bacteremia 05/06/2019   Social History   Socioeconomic History  . Marital status: Single    Spouse name: Not on file  . Number of children: Not on file  . Years of education: Not on file  . Highest education level: Not on file  Occupational History  . Not on file  Tobacco Use  . Smoking status: Former Smoker    Years: 20.00  . Smokeless tobacco: Never Used  Substance and Sexual Activity  . Alcohol use: Not Currently  . Drug use: Never  . Sexual activity: Not on file  Other Topics Concern    . Not on file  Social History Narrative  . Not on file   Social Determinants of Health   Financial Resource Strain:   . Difficulty of Paying Living Expenses:   Food Insecurity:   . Worried About Charity fundraiser in the Last Year:   . Arboriculturist in the Last Year:   Transportation Needs:   . Film/video editor (Medical):   Marland Kitchen Lack of Transportation (Non-Medical):   Physical Activity:   . Days of Exercise per Week:   . Minutes of Exercise per Session:   Stress:   . Feeling of Stress :   Social Connections:   . Frequency of Communication with Friends and Family:   . Frequency of Social Gatherings with Friends and Family:   . Attends Religious Services:   . Active Member of Clubs or Organizations:   .  Attends Archivist Meetings:   Marland Kitchen Marital Status:    History reviewed. No pertinent family history. Scheduled Meds: . amoxicillin-clavulanate  1 tablet Oral Q12H  . vitamin C  500 mg Oral Daily  . chlorhexidine  15 mL Mouth Rinse BID  . Chlorhexidine Gluconate Cloth  6 each Topical Daily  . divalproex  500 mg Oral QHS  . feeding supplement (ENSURE ENLIVE)  237 mL Oral TID BM  . loratadine  10 mg Oral Daily  . mouth rinse  15 mL Mouth Rinse q12n4p  . multivitamin with minerals  1 tablet Oral Daily  . pantoprazole  40 mg Oral Daily  . sertraline  25 mg Oral Daily  . sodium chloride  1 spray Nasal BID  . sodium chloride flush  3 mL Intravenous Q12H  . sodium zirconium cyclosilicate  5 g Oral Daily  . sulfamethoxazole-trimethoprim  1 tablet Oral Q12H  . umeclidinium bromide  1 puff Inhalation Daily   Continuous Infusions: PRN Meds:.acetaminophen **OR** acetaminophen, albuterol, lidocaine, lidocaine, lidocaine-EPINEPHrine, Melatonin, morphine injection, morphine CONCENTRATE, ondansetron (ZOFRAN) IV, oxymetazoline, silver nitrate applicators, Triple Antibiotic Medications Prior to Admission:  Prior to Admission medications   Medication Sig Start Date End Date  Taking? Authorizing Provider  albuterol (VENTOLIN HFA) 108 (90 Base) MCG/ACT inhaler Inhale 2 puffs into the lungs every 6 (six) hours as needed for shortness of breath (FOR Shortness of Breath  (S.O.B.)).   Yes [provider]  amoxicillin-clavulanate (AUGMENTIN) 400-57 MG/5ML suspension Take 10.9 mLs (875 mg total) by mouth every 12 (twelve) hours for 21 days. 05/24/19 06/14/19 Yes Fritzi Mandes, MD  cholecalciferol (VITAMIN D) 25 MCG (1000 UT) tablet Take 1 tablet (1,000 Units total) by mouth daily. 02/12/19  Yes Medina-Vargas, Monina C, NP  clopidogrel (PLAVIX) 75 MG tablet Take 1 tablet (75 mg total) by mouth daily. 02/12/19  Yes Medina-Vargas, Monina C, NP  divalproex (DEPAKOTE ER) 500 MG 24 hr tablet Take 1 tablet (500 mg total) by mouth at bedtime. 02/12/19  Yes Medina-Vargas, Monina C, NP  docusate sodium (COLACE) 100 MG capsule Take 1 capsule (100 mg total) by mouth at bedtime. 02/12/19  Yes Medina-Vargas, Monina C, NP  guaiFENesin (ROBITUSSIN) 100 MG/5ML liquid Take 200 mg by mouth 4 (four) times daily as needed for cough.   Yes [provider]  [place] (DERMACLOUD) CREA Apply topically. Every shift   Yes [provider]  ipratropium-albuterol (DUONEB) 0.5-2.5 (3) MG/3ML SOLN Take 3 mLs by nebulization every 6 (six) hours as needed (sob/wheezing).   Yes [provider]  loratadine (CLARITIN) 10 MG tablet Take 1 tablet (10 mg total) by mouth daily. 02/12/19  Yes Medina-Vargas, Monina C, NP  Melatonin 3 MG TABS Take 3 mg by mouth at bedtime as needed (sleep).    Yes [provider]  Multiple Vitamin (MULTIVITAMIN WITH MINERALS) TABS tablet Take 1 tablet by mouth daily. 05/11/19  Yes Hosie Poisson, MD  Multiple Vitamins-Minerals (CEROVITE SENIOR PO) Take 1 tablet by mouth daily.   Yes [provider]  Nutritional Supplement LIQD Take 120 mLs by mouth 2 (two) times daily. MedPass   Yes [provider]  Nutritional Supplements  (NUTRITIONAL SUPPLEMENT PO) Take 1 each by mouth daily. Magic Cup   Yes [provider]  pantoprazole (PROTONIX) 40 MG tablet Take 1 tablet (40 mg total) by mouth daily. 05/24/19  Yes Fritzi Mandes, MD  polyethylene glycol powder (GLYCOLAX/MIRALAX) 17 GM/SCOOP powder Take 17 g by mouth daily. 02/12/19  Yes Medina-Vargas, Monina C, NP  sertraline (ZOLOFT) 50 MG tablet Take 25 mg by mouth daily.  04/27/19  Yes [provider]  sulfamethoxazole-trimethoprim (BACTRIM) 200-40 MG/5ML suspension Take 20 mLs by mouth every 12 (twelve) hours for 21 days. 05/24/19 06/14/19 Yes Fritzi Mandes, MD  tiotropium (SPIRIVA HANDIHALER) 18 MCG inhalation capsule Place 1 capsule (18 mcg total) into inhaler and inhale daily. 02/12/19  Yes Medina-Vargas, Monina C, NP  vitamin C (ASCORBIC ACID) 500 MG tablet Take 1 tablet (500 mg total) by mouth daily. 02/12/19  Yes Medina-Vargas, Monina C, NP  zinc sulfate 220 (50 Zn) MG capsule Take 220 mg by mouth daily.  04/19/19  Yes [provider]   No Known Allergies Review of Systems +confusion +shortness of breath  Physical Exam Frail appearing elderly gentleman Has R nare nasal packing Is on NRB O2 Some labored breathing evident Diminished breath sounds S1 S2 No edema Verbalizes some. Is not able to participate in long conversations. Confused.   Vital Signs: BP 107/70 (BP Location: Left Arm)   Pulse (!) 119   Temp 98.1 F (36.7 C) (Oral)   Resp 19   Ht '5\' 11"'$  (1.803 m)   Wt 62.5 kg   SpO2 90%   BMI 19.22 kg/m  Pain Scale: 0-10   Pain Score: 0-No pain   SpO2: SpO2: 90 % O2 Device:SpO2: 90 % O2 Flow Rate: .O2 Flow Rate (L/min): 15 L/min  IO: Intake/output summary:   Intake/Output Summary (Last 24 hours) at 06/04/2019 1432 Last data filed at 06/04/2019 0453 Gross per 24 hour  Intake 240 ml  Output 1300 ml  Net -1060 ml    LBM: Last BM Date: 06/03/19 Baseline Weight: Weight: 57.5 kg Most recent weight: Weight: 62.5 kg       Palliative Assessment/Data:   PPS 30%  Time In:  1300 Time Out:   1410 Time Total:  70 min.  Greater than 50%  of this time was spent counseling and coordinating care related to the above assessment and plan.  Signed by: Loistine Chance, MD   Please contact Palliative Medicine Team phone at [phone] for questions and concerns.  For individual provider: See Shea Evans

## 2019-06-04 NOTE — Progress Notes (Signed)
PROGRESS NOTE  Paul Suarez  ZOX:[id number] DOB: [birth year] DOA: 05/30/2019 PCP: Pecola Lawless, MD   Brief Narrative: Paul Suarez is a 79 y.o. male with a history of chronic hypoxic respiratory failure due to COPD, CAD, HTN, seizure disorder, and recent aspiration pneumonia complicated by hemoptysis who presented from SNF for a nose bleed. He had been hospitalized 2/26 - 3/7 for pneumonia, then again 3/8 - 3/11 for recurrent aspiration pneumonia suspected to be due to large hiatal hernia (not dysphagia) complicated by lung abscess, ultimately discharged on augmentin and bactrim to end 4/1. He had hemoptysis during that admission and aspirin was held, with plavix continued. On admission here the right nare was packed, 1u PRBCs given and ENT consulted for recurrent bleeding which resolved with holding plavix and repacking. Hgb remained low so an additional unit of RBCs given 3/19. Hgb has stabilized, though respiratory status has deteriorated. Despite ongoing antibiotics, new infiltrate has developed suspected to be due to aspiration. Per oral intake is minimal, mentation is worsening.   Assessment & Plan: Principal Problem:   Acute on chronic respiratory failure with hypoxia (HCC) Active Problems:   CAD (coronary artery disease)   Essential hypertension   Major depression, recurrent (HCC)   Seizure disorder (HCC)   AMS (altered mental status)   Acute blood loss anemia   Pressure injury of skin   Protein-calorie malnutrition, severe   Aspiration pneumonia (HCC)   Epistaxis   COPD (chronic obstructive pulmonary disease) (HCC)  Acute on chronic hypoxemic respiratory failure due to aspiration pneumonia, pneumonitis, lung abscess and COPD: Dyspnea worsened by anemia. This problem appears to be worsening.  - CXR this AM personally reviewed and reviewed w/family showing stable infiltrates with additional new RLL haziness. This is most likely from aspiration pneumonitis.  - Would not transfer to  ICU/escalate Tx based on GOC discussions. Can continue mask.  - Consider transition to humidified O2 once stabilizing and packing removed. - Continue antibiotics. Scheduled to stay on augmentin and bactrim through 06/14/2019.  - Continue PPI. Liberalized diet to improve po intake. - Continue bronchodilators.   Acute blood loss anemia on anemia of chronic disease: s/p 1u PRBCs 3/18, 1u PRBCs 3/19. INR 1.2. - No further bleeding and hgb stable. Check CBC if bleeding recurs.  Hypotension: No evidence of end organ dysfunction. Improved. - This has remained stable and asymptomatic, improving. Note recently low BPs in outpatient setting (BPs 90-100's/50-60's).  - No new bleeding, infection, or dehydration currently noted.  Epistaxis: Began rebleeding this afternoon. ENT consulted, replaced packing.  - Plan to remove packing 3/23 as inpatient. - Holding plavix. Will not be restarting this. - Prn afrin. Can use nasal saline on left nare with irritation but no bleeding.  - AVOID nasal cannula oxygen until packing is removed.  Sinus tachycardia: Noted in admission in February. TSH wnl. Did not improve with IV fluids and transfusions. - Beginning to suspect this is from ongoing aspiration as well.  - Continue telemetry. - Treat symptoms of pain/anxiety.  CAD: No chest pain.  - DC plavix. Note senile purpura, chronic anemia, and recurrent bleeding. Would favor not restarting antiplatelet or giving pharmacologic DVT prophylaxis.  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 Not due to PVD.  History of staphylococcal bacteremia: DC'ed 05/11/19 on IV vancomycin, completed 05/20/2019.   Cognitive impairment, weakness: Recent reported failure to thrive with repeated admissions, physical deconditioning. - PT/OT.  - Delirium precautions  Severe protein calorie malnutrition:  - Dietitian consulted.   Seizure  disorder:  - Continue home depakote.   Unstageable PI to the right heel:  1cm x 2cm with depth unable to  be determined due to the presence of nonviable sloughing skin.  Patient reports tenderness in this area. DTPI to the sacrum:  Intact, but slow-to-blanch area with purple discoloration noted in the sacral area measuring 2cm x 3cm.  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 Pink moist wound bed, scant serous drainage. -Right ischial tuberosity measures 1cm round x 0.2cm and is pressure confounded by moisture.  Moist pink wound bed, scant serous drainage. -Right stump measures 1cm x 3cm x 0.1cm and presents as moist, pink denuded area of pressure confounded by friction. No exudate.  Wound care and offloading.  History of ESBL UTI: UA wnl. - Contact precautions per infection prevention.   DVT prophylaxis: SCDs Code Status: DNR POA Family Communication: SIL by phone, daughter at bedside at length. Disposition Plan: Patient from SNF, with progressive decline, anticipate DC to residential hospice.   Consultants:   ENT  Palliative care medicine  Procedures:   Nasal packing by Dr. Pollyann Kennedy 3/18.  Antimicrobials:  Augmentin, bactrim through 4/1   Subjective: Confused, not eating much, denies shortness of breath but desaturates consistently. No chest pain.   Objective: Vitals:   06/03/19 0613 06/03/19 1328 06/03/19 2200 06/04/19 0452  BP: 114/68 (!) 104/55 107/70   Pulse: (!) 102 (!) 122 (!) 119   Resp: 19 19 19    Temp: 98.4 F (36.9 C) 98.5 F (36.9 C) 98.1 F (36.7 C)   TempSrc: Oral  Oral   SpO2: 97% 98% 90%   Weight:    62.5 kg  Height:        Intake/Output Summary (Last 24 hours) at 06/04/2019 1513 Last data filed at 06/04/2019 0453 Gross per 24 hour  Intake 240 ml  Output 1300 ml  Net -1060 ml    Gen: 79 y.o. male in no distress, frail, malnourished. Pulm: Nonlabored, coarse, wet cough. CV: Regular rate and rhythm. No murmur, rub, or gallop. No JVD, no  dependent edema. GI: Abdomen soft, non-tender, non-distended, with normoactive bowel sounds.  Ext: Warm, no deformities Skin: No rashes, lesions or ulcers on visualized skin. Neuro: Alert and incompletely oriented. No focal neurological deficits. Psych: Judgement and insight appear impaired. Mood euthymic & affect congruent. Behavior is appropriate.     Data Reviewed: I have personally reviewed following labs and imaging studies  CBC: Recent Labs  Lab 05/30/19 1718 05/30/19 2230 05/31/19 0500 05/31/19 1732 06/01/19 0154 06/01/19 1730 06/02/19 0142 06/03/19 0336 06/04/19 0342  WBC 9.3  --  9.6  --  6.8  --  6.0 6.7 8.7  NEUTROABS 6.2  --  6.0  --   --   --   --   --   --   HGB 9.3*   < > 7.4*   < > 7.8* 9.1* 8.7* 7.9* 8.9*  HCT 30.3*   < > 24.2*   < > 25.0* 29.2* 27.6* 26.1* 29.1*  MCV 98.4  --  98.8  --  96.2  --  93.9 94.9 96.4  PLT 424*  --  356  --  319  --  340 380 451*   < > = values in this interval not displayed.   Basic Metabolic Panel: Recent Labs  Lab 05/30/19 1718 05/30/19 1718 05/31/19 0500 06/01/19 0154 06/02/19  0142 06/03/19 0336 06/04/19 0342  NA 137   < > 138 136 136 137 137  K 4.2   < > 4.9 4.3 4.3 4.5 5.2*  CL 104   < > 108 103 103 102 100  CO2 25   < > 27 27 27 29 31   GLUCOSE 132*   < > 96 123* 84 88 92  BUN 27*   < > 32* 18 9 10 15   CREATININE 0.66   < > 0.47* 0.54* 0.42* 0.49* 0.56*  CALCIUM 8.4*   < > 7.6* 8.1* 8.1* 8.1* 8.5*  MG 2.0  --  1.8  --   --   --   --    < > = values in this interval not displayed.   Liver Function Tests: Recent Labs  Lab 05/31/19 0500  AST 16  ALT 11  ALKPHOS 51  BILITOT 0.7  PROT 5.0*  ALBUMIN 2.3*   Coagulation Profile: Recent Labs  Lab 05/30/19 1718 05/31/19 0500  INR 1.1 1.2   Urine analysis:    Component Value Date/Time   COLORURINE YELLOW 06/03/2019 1815   APPEARANCEUR CLEAR 06/03/2019 1815   LABSPEC 1.013 06/03/2019 1815   PHURINE 6.0 06/03/2019 1815   GLUCOSEU NEGATIVE 06/03/2019 1815    HGBUR NEGATIVE 06/03/2019 1815   BILIRUBINUR NEGATIVE 06/03/2019 1815   KETONESUR NEGATIVE 06/03/2019 1815   PROTEINUR NEGATIVE 06/03/2019 1815   NITRITE NEGATIVE 06/03/2019 1815   LEUKOCYTESUR NEGATIVE 06/03/2019 1815   No results found for this or any previous visit (from the past 240 hour(s)).    Radiology Studies: DG CHEST PORT 1 VIEW  Result Date: 06/04/2019 CLINICAL DATA:  Lung abscess.  Acute on chronic respiratory failure EXAM: PORTABLE CHEST 1 VIEW COMPARISON:  Five days ago FINDINGS: Hazy infiltrates the left base and right mid chest. This has the appearance of aspiration pneumonia by prior chest CT. There is worsening aeration on the right with elevated diaphragm and increased basal opacity. Biapical scarring. Stable heart size and mediastinal contours distorted by rotation. IMPRESSION: Aspiration pneumonia with worsening aeration on the right. Electronically Signed   By: Monte Fantasia M.D.   On: 06/04/2019 06:28    Scheduled Meds: . amoxicillin-clavulanate  1 tablet Oral Q12H  . vitamin C  500 mg Oral Daily  . chlorhexidine  15 mL Mouth Rinse BID  . Chlorhexidine Gluconate Cloth  6 each Topical Daily  . divalproex  500 mg Oral QHS  . feeding supplement (ENSURE ENLIVE)  237 mL Oral TID BM  . loratadine  10 mg Oral Daily  . mouth rinse  15 mL Mouth Rinse q12n4p  . multivitamin with minerals  1 tablet Oral Daily  . pantoprazole  40 mg Oral Daily  . sertraline  25 mg Oral Daily  . sodium chloride  1 spray Nasal BID  . sodium chloride flush  3 mL Intravenous Q12H  . sodium zirconium cyclosilicate  5 g Oral Daily  . sulfamethoxazole-trimethoprim  1 tablet Oral Q12H  . umeclidinium bromide  1 puff Inhalation Daily   Continuous Infusions:    LOS: 5 days   Time spent: 35 minutes.  Patrecia Pour, MD [facility] [email or web address] 06/04/2019, 3:13 PM

## 2019-06-05 DIAGNOSIS — J690 Pneumonitis due to inhalation of food and vomit: Principal | ICD-10-CM

## 2019-06-05 LAB — CBC
HCT: 29.2 % — ABNORMAL LOW (ref 39.0–52.0)
Hemoglobin: 8.9 g/dL — ABNORMAL LOW (ref 13.0–17.0)
MCH: 29 pg (ref 26.0–34.0)
MCHC: 30.5 g/dL (ref 30.0–36.0)
MCV: 95.1 fL (ref 80.0–100.0)
Platelets: 496 10*3/uL — ABNORMAL HIGH (ref 150–400)
RBC: 3.07 MIL/uL — ABNORMAL LOW (ref 4.22–5.81)
RDW: 16.1 % — ABNORMAL HIGH (ref 11.5–15.5)
WBC: 9.3 10*3/uL (ref 4.0–10.5)
nRBC: 0 % (ref 0.0–0.2)

## 2019-06-05 LAB — BASIC METABOLIC PANEL
Anion gap: 8 (ref 5–15)
BUN: 19 mg/dL (ref 8–23)
CO2: 32 mmol/L (ref 22–32)
Calcium: 8.6 mg/dL — ABNORMAL LOW (ref 8.9–10.3)
Chloride: 98 mmol/L (ref 98–111)
Creatinine, Ser: 0.52 mg/dL — ABNORMAL LOW (ref 0.61–1.24)
GFR calc Af Amer: >60 mL/min (ref >60)
GFR calc non Af Amer: >60 mL/min (ref >60)
Glucose, Bld: 95 mg/dL (ref 70–99)
Potassium: 4.6 mmol/L (ref 3.5–5.1)
Sodium: 138 mmol/L (ref 135–145)

## 2019-06-05 NOTE — Progress Notes (Signed)
Civil engineer, contracting Documentation     Liaison received referral for pt to transfer to [facility] for EOL care. Pt has been approved for [facility] transfer. [facility] does have a bed available for pt today.   Liaison spoke with pt's daughter who is hesitant to transfer pt to [facility] due to visitor restrictions and stated that she needs more time to decide. Liaison also explained that we could assist with bringing him home with hospice care.     Please call with any questions.      Thank you,   Trena Platt, RN

## 2019-06-05 NOTE — Progress Notes (Signed)
Daily Progress Note   Patient Name: Paul Suarez       Date: 06/05/2019 DOB: 12/06/1940  Age: 79 y.o. MRN#: [id number] Attending Physician: Tyrone Nine, MD Primary Care Physician: Pecola Lawless, MD Admit Date: 05/30/2019  Reason for Consultation/Follow-up: Establishing goals of care and Hospice Evaluation  Subjective: I saw and examined Paul Suarez today.  He was awake and alert and in a very good mood as 2 of his daughters were bedside at the time of my encounter.  His dinner tray was delivered as well as in the room.  He is noted to still not be having much intake.  Length of Stay: 6  Current Medications: Scheduled Meds:  . amoxicillin-clavulanate  1 tablet Oral Q12H  . vitamin C  500 mg Oral Daily  . chlorhexidine  15 mL Mouth Rinse BID  . Chlorhexidine Gluconate Cloth  6 each Topical Daily  . divalproex  500 mg Oral QHS  . feeding supplement (ENSURE ENLIVE)  237 mL Oral TID BM  . loratadine  10 mg Oral Daily  . mouth rinse  15 mL Mouth Rinse q12n4p  . multivitamin with minerals  1 tablet Oral Daily  . pantoprazole  40 mg Oral Daily  . sertraline  25 mg Oral Daily  . sodium chloride  1 spray Nasal BID  . sodium chloride flush  3 mL Intravenous Q12H  . sulfamethoxazole-trimethoprim  1 tablet Oral Q12H  . umeclidinium bromide  1 puff Inhalation Daily    Continuous Infusions:   PRN Meds: acetaminophen **OR** acetaminophen, albuterol, lidocaine, lidocaine, lidocaine-EPINEPHrine, melatonin, morphine injection, morphine CONCENTRATE, ondansetron (ZOFRAN) IV, oxymetazoline, silver nitrate applicators, Triple Antibiotic  Physical Exam         General: Frail appearing.  Follows conversation well. Resp: Slightly increased work of breathing.  Desats with talking.  On nasal cannula 4  L. CV: Tachycardic.  No murmur. Extremities: No edema.  Moves 4 extremities  Vital Signs: BP 100/89 (BP Location: Right Arm)   Pulse 99   Temp 98 F (36.7 C) (Oral)   Resp (!) 28   Ht 5\' 11"  (1.803 m)   Wt 62.5 kg   SpO2 92%   BMI 19.22 kg/m  SpO2: SpO2: 92 % O2 Device: O2 Device: Nasal Cannula O2 Flow Rate: O2 Flow Rate (L/min): 4 L/min  Intake/output summary:   Intake/Output Summary (Last 24 hours) at 06/05/2019 1840 Last data filed at 06/05/2019 1638 Gross per 24 hour  Intake 300 ml  Output 1550 ml  Net -1250 ml   LBM: Last BM Date: 06/04/19 Baseline Weight: Weight: 57.5 kg Most recent weight: Weight: 62.5 kg       Palliative Assessment/Data:      Patient Active Problem List   Diagnosis Date Noted  . Epistaxis 05/31/2019  . COPD (chronic obstructive pulmonary disease) (HCC) 05/31/2019  . Abscess of lung with pneumonia (HCC)   . Aspiration pneumonia (HCC) 05/21/2019  . Anemia of chronic disease 05/21/2019  . Protein-calorie malnutrition, severe 05/10/2019  . Pressure injury of skin 05/08/2019  . Staphylococcus aureus bacteremia 05/06/2019  . Severe sepsis (HCC) 05/02/2019  . Acute on chronic respiratory failure with hypoxia (HCC) 05/02/2019  . AKI (acute kidney injury) (HCC) 05/02/2019  . Acute blood loss anemia 05/02/2019  . Tachycardia 04/26/2019  . AMS (altered mental status) 02/13/2019  . UTI due to extended-spectrum beta lactamase (ESBL) producing Escherichia coli 02/12/2019  . CAD (coronary artery disease) 02/12/2019  . Essential hypertension 02/12/2019  . Vitamin D deficiency 02/12/2019  . Seasonal allergies 02/12/2019  . Muscle spasm/History of left BKA 02/12/2019  . Insomnia 02/12/2019  . COPD with acute exacerbation (HCC) 02/12/2019  . Major depression, recurrent (HCC) 02/12/2019  . Hypokalemia 02/12/2019  . Constipation 02/12/2019  . Seizure disorder [facility]) 02/12/2019    Palliative Care Assessment & Plan   Patient Profile: 79 y.o. male   admitted on 05/30/2019    Paul Suarez a 79 y.o.malewith a history of chronic hypoxic respiratory failure due to COPD, CAD, HTN, seizure disorder, and recent aspiration pneumonia complicated by hemoptysis who presented from SNF for a nose bleed. He had been hospitalized 2/26 - 3/7 for pneumonia, then again 3/8 - 3/11 for recurrent aspiration pneumonia suspected to be due to large hiatal hernia (not dysphagia) complicated by lung abscess, ultimately discharged on augmentin and bactrim to end 4/1. He had hemoptysis during that admission and aspirin was held, with plavix continued. On admission this time, the right nare was packed, 1u PRBCs given and ENT consulted for recurrent bleeding which resolved with holding plavix and repacking. Hgb remained low so an additional unit of RBCs given 3/19.  Recommendations/Plan:  DNR/DNI  Continue current care.  Per his daughter, he is having a better day today.  Discussed that this may be short interval improvement, particularly given how he is happy to see his daughter from [place].  Plan for follow-up tomorrow.  He was offered bed at [facility] today, but family was concerned about visitation restrictions.  Code Status:    Code Status Orders  (From admission, onward)         Start     Ordered   05/30/19 1954  Do not attempt resuscitation (DNR)  Continuous    Question Answer Comment  In the event of cardiac or respiratory ARREST Do not call a "code blue"   In the event of cardiac or respiratory ARREST Do not perform Intubation, CPR, defibrillation or ACLS   In the event of cardiac or respiratory ARREST Use medication by any route, position, wound care, and other measures to relive pain and suffering. May use oxygen, suction and manual treatment of airway obstruction as needed for comfort.   Comments Discussed code status with patient's daughter [name] and he is a DNR      05/30/19 1955  Code Status History    Date Active Date Inactive  Code Status Order ID Comments User Context   05/21/2019 1338 05/24/2019 1609 DNR [id number]  Collier Bullock, MD ED   05/06/2019 1605 05/11/2019 2058 DNR [id number]  Spero Geralds, MD Inpatient   05/02/2019 2024 05/06/2019 1605 Full Code [id number]  Vianne Bulls, MD ED   Advance Care Planning Activity    Advance Directive Documentation     Most Recent Value  Type of Advance Directive  Healthcare Power of Attorney  Pre-existing out of facility DNR order (yellow form or pink MOST form)  --  "MOST" Form in Place?  --       Prognosis:  Less than 2 weeks most likely.  He does appear to be somewhat improved today, however, I fear this is short-lived as he is at high risk for continued decompensation with aspiration and recurrence of bleeding.  Discharge Planning:  Hospice facility  Care plan was discussed with patient, daughters  Thank you for allowing the Palliative Medicine Team to assist in the care of this patient.   Time In:  1700 Time Out:  1725 Total Time  25 Prolonged Time Billed  no       Greater than 50%  of this time was spent counseling and coordinating care related to the above assessment and plan.  Micheline Rough, MD  Please contact Palliative Medicine Team phone at [phone] for questions and concerns.

## 2019-06-05 NOTE — TOC Progression Note (Addendum)
Transition of Care [facility]) - Progression Note    Patient Details  Name: [name] MRN: [id number] Date of Birth: 1940/03/29  Transition of Care [facility]) CM/SW Contact  Darleene Cleaver, [place] Phone Number: 06/05/2019, 1:04 PM  Clinical Narrative:     CSW spoke to patient's daughter Valentino Hue, [phone], and provided choices for hospice facility.  Patient's daughter said she is interested in [facility] in [place] or [facility] facility in [place].  CSW contacted [facility] at [phone], and she will review patient's information, and discuss with their medical director, then call CSW back.  CSW awaiting for call back from [facility] representative.  4:00pm  CSW received phone call back from [facility], patient is eligible, however the patient's family would like a more lenient visitation policy.  CSW was given permission to contact [facility] to find out what their visitation policy is.  Family would like to speak to someone from [facility], CSW contacted [facility] and spoke to [place] and she will review patient's information and contact his daughter Valentino Hue.  CSW to continue to follow patient's progress throughout discharge planning.   Expected Discharge Plan: Skilled Nursing Facility Barriers to Discharge: Continued Medical Work up  Expected Discharge Plan and Services Expected Discharge Plan: Skilled Nursing Facility In-house Referral: Clinical Social Work   Post Acute Care Choice: Skilled Nursing Facility Living arrangements for the past 2 months: Skilled Nursing Facility                                       Social Determinants of Health (SDOH) Interventions    Readmission Risk Interventions Readmission Risk Prevention Plan 05/22/2019  Transportation Screening Complete  PCP or Specialist Appt within 3-5 Days Complete  HRI or Home Care Consult Complete  Social Work Consult for Recovery Care Planning/Counseling  Complete  Palliative Care Screening Complete  Medication Review [facility]) Referral to Pharmacy

## 2019-06-05 NOTE — Progress Notes (Signed)
[facility]  Discussed with pt's daughters [name] and [facility] hospice services and they do report they are in agreement with hospice care and confirm DNR status. They confirm they would like to proceed with the referral intake and transfer to [facility] when bed available.   We unfortunately do not have a bed available today but will re-evaluate in am to move pt to the [facility] in [facility] once a bed become available.   Thank you, Norm Parcel RN [phone]

## 2019-06-05 NOTE — Progress Notes (Signed)
PROGRESS NOTE  Paul Suarez  XBJ:[id number] DOB: 1940/05/20 DOA: 05/30/2019 PCP: Pecola Lawless, MD   Brief Narrative: Paul Suarez is a 79 y.o. male with a history of chronic hypoxic respiratory failure due to COPD, CAD, HTN, seizure disorder, and recent aspiration pneumonia complicated by hemoptysis who presented from SNF for a nose bleed. He had been hospitalized 2/26 - 3/7 for pneumonia, then again 3/8 - 3/11 for recurrent aspiration pneumonia suspected to be due to large hiatal hernia (not dysphagia) complicated by lung abscess, ultimately discharged on augmentin and bactrim to end 4/1. He had hemoptysis during that admission and aspirin was held, with plavix continued. On admission here the right nare was packed, 1u PRBCs given and ENT consulted for recurrent bleeding which resolved with holding plavix and repacking. Hgb remained low so an additional unit of RBCs given 3/19. Hgb has stabilized, though respiratory status has deteriorated. Despite ongoing antibiotics, new infiltrate has developed suspected to be due to aspiration. Per oral intake is minimal, mentation is worsening. Palliative care is consulted and transition to residential hospice is planned. Would not move to a different room before transfer to hospice.  Assessment & Plan: Principal Problem:   Acute on chronic respiratory failure with hypoxia (HCC) Active Problems:   CAD (coronary artery disease)   Essential hypertension   Major depression, recurrent (HCC)   Seizure disorder (HCC)   AMS (altered mental status)   Acute blood loss anemia   Pressure injury of skin   Protein-calorie malnutrition, severe   Aspiration pneumonia (HCC)   Epistaxis   COPD (chronic obstructive pulmonary disease) (HCC)  Acute on chronic hypoxemic respiratory failure due to aspiration pneumonia, pneumonitis, lung abscess and COPD: Dyspnea worsened by anemia. This problem appears to be worsening.  - CXR showed stable infiltrates with additional new  RLL haziness from silent aspiration. This indicates a very poor prognosis with escalating oxygen requirements and infiltrates despite aspiration precautions and uninterrupted antibiotics. - Scheduled to stay on augmentin and bactrim through 06/14/2019. - Continue PPI. Liberalized diet   - Continue bronchodilators.   Acute blood loss anemia on anemia of chronic disease: s/p 1u PRBCs 3/18, 1u PRBCs 3/19. INR 1.2. - No further bleeding and hgb stable x48 hrs. Check CBC if bleeding recurs.  Hypotension: No evidence of end organ dysfunction. Improved. - This has remained stable and asymptomatic, improving. Note recently low BPs in outpatient setting (BPs 90-100's/50-60's).  - No new bleeding, infection, or dehydration currently noted.  Epistaxis:  - ENT consulted, placed packing in ED, removed on 3/23 with no further bleeding noted.  - Holding plavix. Will not be restarting this. - Prn afrin. Can use nasal saline on left nare with irritation but no bleeding.  - AVOID nasal cannula oxygen if possible unless mask is uncomfortable for patient.  Sinus tachycardia: Noted in admission in February. TSH wnl. Did not improve with IV fluids and transfusions. - Likely due to ongoing aspiration as well.  - Can DC telemetry.  - Treat symptoms of pain/anxiety.  CAD: No chest pain.  - DC plavix. Note senile purpura, chronic anemia, and recurrent bleeding. Would favor not restarting antiplatelet or giving pharmacologic DVT prophylaxis.  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 Not due to PVD.  History of staphylococcal bacteremia: DC'ed 05/11/19 on IV vancomycin, completed 05/20/2019.   Cognitive impairment, weakness: Recent reported failure to thrive with repeated admissions, physical deconditioning. - PT/OT.  - Delirium precautions  Severe protein calorie malnutrition:  - Dietitian consulted.  Seizure disorder:  - Continue home depakote.   Unstageable PI to the right heel:  1cm x 2cm with depth unable to be  determined due to the presence of nonviable sloughing skin.  Patient reports tenderness in this area. DTPI to the sacrum. Stage 2 pressure injuries to the right hip, right ischial tuberosity:  - No evidence of infection.  - Local wound care and offloading recommended.  History of ESBL UTI: UA wnl. - Contact precautions per infection prevention.   DVT prophylaxis: SCDs Code Status: DNR POA Family Communication: Will discuss later today, none at bedside this AM. Disposition Plan: Patient from SNF, with progressive decline, anticipate DC to residential hospice.   Consultants:   ENT  Palliative care medicine  Procedures:   Nasal packing by Dr. Constance Holster 3/18.  Antimicrobials:  Augmentin, bactrim through 4/1   Subjective: Alert, denies pain or trouble breathing.   Objective: Vitals:   06/04/19 0452 06/04/19 2210 06/05/19 0423 06/05/19 0912  BP:  118/61 107/74   Pulse:  (!) 124 (!) 119   Resp:  (!) 32 (!) 28   Temp:  99.1 F (37.3 C) 98.6 F (37 C)   TempSrc:  Oral Oral   SpO2:  97% 95% 99%  Weight: 62.5 kg     Height:        Intake/Output Summary (Last 24 hours) at 06/05/2019 1326 Last data filed at 06/05/2019 1135 Gross per 24 hour  Intake 200 ml  Output 1300 ml  Net -1100 ml    Gen: 79 y.o. male in no distress Pulm: Nonlabored, rhonchi noted. CV: Regular tachycardia. No murmur, rub, or gallop. No JVD, no dependent edema. GI: Abdomen soft, non-tender, non-distended, with normoactive bowel sounds.  Ext: Warm, no deformities Skin: Posterior pressure injuries without exudate as detailed above noted today.  Neuro: Alert and incompletely oriented. No focal neurological deficits. Psych: Judgement and insight appear impaired. Mood euthymic & affect congruent. Behavior is appropriate.    Data Reviewed: I have personally reviewed following labs and imaging studies  CBC: Recent Labs  Lab 05/30/19 1718 05/30/19 2230 05/31/19 0500 05/31/19 1732 06/01/19 0154  06/01/19 0154 06/01/19 1730 06/02/19 0142 06/03/19 0336 06/04/19 0342 06/05/19 0407  WBC 9.3  --  9.6  --  6.8  --   --  6.0 6.7 8.7 9.3  NEUTROABS 6.2  --  6.0  --   --   --   --   --   --   --   --   HGB 9.3*   < > 7.4*   < > 7.8*   < > 9.1* 8.7* 7.9* 8.9* 8.9*  HCT 30.3*   < > 24.2*   < > 25.0*   < > 29.2* 27.6* 26.1* 29.1* 29.2*  MCV 98.4  --  98.8  --  96.2  --   --  93.9 94.9 96.4 95.1  PLT 424*  --  356  --  319  --   --  340 380 451* 496*   < > = values in this interval not displayed.   Basic Metabolic Panel: Recent Labs  Lab 05/30/19 1718 05/30/19 1718 05/31/19 0500 05/31/19 0500 06/01/19 0154 06/02/19 0142 06/03/19 0336 06/04/19 0342 06/05/19 0407  NA 137   < > 138   < > 136 136 137 137 138  K 4.2   < > 4.9   < > 4.3 4.3 4.5 5.2* 4.6  CL 104   < > 108   < > 103  103 102 100 98  CO2 25   < > 27   < > 27 27 29 31  32  GLUCOSE 132*   < > 96   < > 123* 84 88 92 95  BUN 27*   < > 32*   < > 18 9 10 15 19   CREATININE 0.66   < > 0.47*   < > 0.54* 0.42* 0.49* 0.56* 0.52*  CALCIUM 8.4*   < > 7.6*   < > 8.1* 8.1* 8.1* 8.5* 8.6*  MG 2.0  --  1.8  --   --   --   --   --   --    < > = values in this interval not displayed.   Liver Function Tests: Recent Labs  Lab 05/31/19 0500  AST 16  ALT 11  ALKPHOS 51  BILITOT 0.7  PROT 5.0*  ALBUMIN 2.3*   Coagulation Profile: Recent Labs  Lab 05/30/19 1718 05/31/19 0500  INR 1.1 1.2   Urine analysis:    Component Value Date/Time   COLORURINE YELLOW 06/03/2019 1815   APPEARANCEUR CLEAR 06/03/2019 1815   LABSPEC 1.013 06/03/2019 1815   PHURINE 6.0 06/03/2019 1815   GLUCOSEU NEGATIVE 06/03/2019 1815   HGBUR NEGATIVE 06/03/2019 1815   BILIRUBINUR NEGATIVE 06/03/2019 1815   KETONESUR NEGATIVE 06/03/2019 1815   PROTEINUR NEGATIVE 06/03/2019 1815   NITRITE NEGATIVE 06/03/2019 1815   LEUKOCYTESUR NEGATIVE 06/03/2019 1815   No results found for this or any previous visit (from the past 240 hour(s)).    Radiology  Studies: DG CHEST PORT 1 VIEW  Result Date: 06/04/2019 CLINICAL DATA:  Lung abscess.  Acute on chronic respiratory failure EXAM: PORTABLE CHEST 1 VIEW COMPARISON:  Five days ago FINDINGS: Hazy infiltrates the left base and right mid chest. This has the appearance of aspiration pneumonia by prior chest CT. There is worsening aeration on the right with elevated diaphragm and increased basal opacity. Biapical scarring. Stable heart size and mediastinal contours distorted by rotation. IMPRESSION: Aspiration pneumonia with worsening aeration on the right. Electronically Signed   By: 06/05/2019 M.D.   On: 06/04/2019 06:28    Scheduled Meds: . amoxicillin-clavulanate  1 tablet Oral Q12H  . vitamin C  500 mg Oral Daily  . chlorhexidine  15 mL Mouth Rinse BID  . Chlorhexidine Gluconate Cloth  6 each Topical Daily  . divalproex  500 mg Oral QHS  . feeding supplement (ENSURE ENLIVE)  237 mL Oral TID BM  . loratadine  10 mg Oral Daily  . mouth rinse  15 mL Mouth Rinse q12n4p  . multivitamin with minerals  1 tablet Oral Daily  . pantoprazole  40 mg Oral Daily  . sertraline  25 mg Oral Daily  . sodium chloride  1 spray Nasal BID  . sodium chloride flush  3 mL Intravenous Q12H  . sulfamethoxazole-trimethoprim  1 tablet Oral Q12H  . umeclidinium bromide  1 puff Inhalation Daily   Continuous Infusions:    LOS: 6 days   Time spent: 35 minutes.  Marnee Spring, MD [facility] [email or web address] 06/05/2019, 1:26 PM

## 2019-06-05 NOTE — Progress Notes (Signed)
Patient ID: Paul Suarez, male   DOB: 07-Feb-1941, 79 y.o.   MRN: [id number] No further bleeding.  Packing removed.  Reconsult if needed.

## 2019-06-05 NOTE — Progress Notes (Signed)
Pt currently a red MEWS, this is not an acute change. Pt resting in bed with O2 on as ordered. No c/o pain at this time. Remains tachycardic on the telemetry monitor. Call light within reach. Educated to use call light for assistance if needed. Pt is possibly being admitted to hospice services. Will cont to mx.

## 2019-06-06 MED ORDER — MORPHINE SULFATE (CONCENTRATE) 10 MG/0.5ML PO SOLN: 5.0000 mg | ORAL | 0 refills | Status: DC | PRN

## 2019-06-06 MED ORDER — SILVER NITRATE-POT NITRATE 75-25 % EX MISC: 1.0000 | Freq: Once | CUTANEOUS | 0 refills | Status: DC | PRN

## 2019-06-06 MED ORDER — SALINE SPRAY 0.65 % NA SOLN: 1.0000 | Freq: Two times a day (BID) | NASAL | 0 refills | Status: DC

## 2019-06-06 MED ORDER — OXYMETAZOLINE HCL 0.05 % NA SOLN: 1.0000 | Freq: Once | NASAL | 0 refills | Status: DC | PRN

## 2019-06-06 NOTE — Progress Notes (Signed)
[facility]   We do have bed availability today and the daughters has agreed for transfer to [facility] in [place].   Consents will be signed by e-mail.   Thank you for this referral Norm Parcel RN [phone]

## 2019-06-06 NOTE — Discharge Summary (Signed)
Physician Discharge Summary  Paul MouseRalph Zawistowski ZOX:[id number] DOB: December 14, 1940 DOA: 05/30/2019  PCP: Pecola LawlessHopper, William F, MD  Admit date: 05/30/2019 Discharge date: 06/06/2019  Admitted From: SNF Disposition: Residential Hospice.   Recommendations for Outpatient Follow-up:  1. Comfort care.    Discharge Condition: Guarded CODE STATUS: DNR Diet recommendation: Comfort diet  Brief/Interim Summary: Paul Suarez is a 79 y.o. male with a history of chronic hypoxic respiratory failure due to COPD, CAD, HTN, seizure disorder, and recent aspiration pneumonia complicated by hemoptysis who presented from SNF for a nose bleed. He had been hospitalized 2/26 - 3/7 for pneumonia, then again 3/8 - 3/11 for recurrent aspiration pneumonia suspected to be due to large hiatal hernia (not dysphagia) complicated by lung abscess, ultimately discharged on augmentin and bactrim to end 4/1. He had hemoptysis during that admission and aspirin was held, with plavix continued. On admission here the right nare was packed, 1u PRBCs given and ENT consulted for recurrent bleeding which resolved with holding plavix and repacking. Hgb remained low so an additional unit of RBCs given 3/19. Hgb has stabilized, though respiratory status has deteriorated. Despite ongoing antibiotics, new infiltrate has developed suspected to be due to aspiration. Per oral intake is minimal, mentation is worsening. Palliative care is consulted and transition to residential hospice is planned.  Patient seen and examined. He is alert, pleasant. Plan is still for Hospice residential facility placement. [place].   1-Acute on chronic hypoxemic respiratory failure due to aspiration pneumonia, pneumonitis, lung abscess and COPD; patient with history of aspiration pna, recurrent. Chest x ray showed stable infiltrates with additional new RLL haziness from silent aspiration. -plan is for continuation of augmentin and Bactrim until 4-01- Patient at increase risk  for recurrent aspiration, poor oral intake plan is to proceed with residential hospice placement.   2-Acute blood loss anemia on anemia of chronic disease: s/p 1u PRBCs 3/18, 1u PRBCs 3/19. INR 1.2.  -No further bleeding.   3-Hypotension: No evidence of end organ dysfunction. Fluctuates. Asymptomatic currently/.   4-Epistaxis:  - ENT consulted, placed packing in ED, removed on 3/23 with no further bleeding noted.  - Holding plavix. Will not be restarting this. - Prn afrin. Can use nasal saline on left nare with irritation but no bleeding.  - AVOID nasal cannula oxygen if possible unless mask is uncomfortable for patient.  5-CAD; stop plavix due to epistaxis.   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 Not due to PVD. Seizure disorder:  - Continue home depakote.   Unstageable PI to the right heel:  1cm x 2cm with depth unable to be determined due to the presence of nonviable sloughing skin.  Patient reports tenderness in this area.  DTPI to the sacrum. Stage 2 pressure injuries to the right hip, right ischial tuberosity:  - No evidence of infection.  - Local wound care and offloading recommended.   Discharge Diagnoses:  Principal Problem:   Acute on chronic respiratory failure with hypoxia (HCC) Active Problems:   CAD (coronary artery disease)   Essential hypertension   Major depression, recurrent (HCC)   Seizure disorder (HCC)   AMS (altered mental status)   Acute blood loss anemia   Pressure injury of skin   Protein-calorie malnutrition, severe   Aspiration pneumonia (HCC)   Epistaxis   COPD (chronic obstructive pulmonary disease) (HCC)    Discharge Instructions  Discharge Instructions    Diet - low sodium heart healthy   Complete by: As directed  Increase activity slowly   Complete by: As directed      Allergies as of 06/06/2019   No Known Allergies     Medication List    STOP taking these medications   cholecalciferol 25 MCG (1000 UNIT) tablet Commonly known  as: VITAMIN D   clopidogrel 75 MG tablet Commonly known as: Plavix   Nutritional Supplement Liqd   NUTRITIONAL SUPPLEMENT PO   polyethylene glycol powder 17 GM/SCOOP powder Commonly known as: GLYCOLAX/MIRALAX   zinc sulfate 220 (50 Zn) MG capsule     TAKE these medications   albuterol 108 (90 Base) MCG/ACT inhaler Commonly known as: VENTOLIN HFA Inhale 2 puffs into the lungs every 6 (six) hours as needed for shortness of breath (FOR Shortness of Breath  (S.O.B.)).   amoxicillin-clavulanate 400-57 MG/5ML suspension Commonly known as: AUGMENTIN Take 10.9 mLs (875 mg total) by mouth every 12 (twelve) hours for 21 days.   CEROVITE SENIOR PO Take 1 tablet by mouth daily.   Dermacloud Crea Apply topically. Every shift   divalproex 500 MG 24 hr tablet Commonly known as: Depakote ER Take 1 tablet (500 mg total) by mouth at bedtime.   docusate sodium 100 MG capsule Commonly known as: Colace Take 1 capsule (100 mg total) by mouth at bedtime.   guaiFENesin 100 MG/5ML liquid Commonly known as: ROBITUSSIN Take 200 mg by mouth 4 (four) times daily as needed for cough.   ipratropium-albuterol 0.5-2.5 (3) MG/3ML Soln Commonly known as: DUONEB Take 3 mLs by nebulization every 6 (six) hours as needed (sob/wheezing).   loratadine 10 MG tablet Commonly known as: CLARITIN Take 1 tablet (10 mg total) by mouth daily.   melatonin 3 MG Tabs tablet Take 3 mg by mouth at bedtime as needed (sleep).   morphine CONCENTRATE 10 MG/0.5ML Soln concentrated solution Take 0.25 mLs (5 mg total) by mouth every 3 (three) hours as needed for severe pain or shortness of breath.   multivitamin with minerals Tabs tablet Take 1 tablet by mouth daily.   oxymetazoline 0.05 % nasal spray Commonly known as: AFRIN Place 1 spray into both nostrils once as needed for congestion (Bedside procedure).   pantoprazole 40 MG tablet Commonly known as: PROTONIX Take 1 tablet (40 mg total) by mouth daily.    sertraline 50 MG tablet Commonly known as: ZOLOFT Take 25 mg by mouth daily.   silver nitrate applicators 75-25 % applicator Apply 1 Stick topically once as needed for bleeding (Bedside procedure).   sodium chloride 0.65 % Soln nasal spray Commonly known as: OCEAN Place 1 spray into the nose 2 (two) times daily.   Spiriva HandiHaler 18 MCG inhalation capsule Generic drug: tiotropium Place 1 capsule (18 mcg total) into inhaler and inhale daily.   sulfamethoxazole-trimethoprim 200-40 MG/5ML suspension Commonly known as: BACTRIM Take 20 mLs by mouth every 12 (twelve) hours for 21 days.   vitamin C 500 MG tablet Commonly known as: ASCORBIC ACID Take 1 tablet (500 mg total) by mouth daily.       No Known Allergies  Consultations:  Palliative   Procedures/Studies: CT Chest W Contrast  Result Date: 05/21/2019 CLINICAL DATA:  Shortness of breath, COVID positive on 05/02/2019. Difficulty breathing EXAM: CT CHEST WITH CONTRAST TECHNIQUE: Multidetector CT imaging of the chest was performed during intravenous contrast administration. CONTRAST:  67mL OMNIPAQUE IOHEXOL 300 MG/ML  SOLN COMPARISON:  05/01/2019 FINDINGS: Cardiovascular: Right-sided PICC line terminates at the caval to atrial junction. Thoracic aortic caliber is normal with signs of atherosclerotic  calcification. Central pulmonary vasculature mildly engorged. Mediastinum/Nodes: Thoracic inlet structures are unremarkable. No axillary lymphadenopathy. No mediastinal adenopathy. Large hiatal hernia. 2 cm right hilar lymph node, when measured in a similar fashion on the prior study was approximately 1.6 cm. Similar appearance of subcarinal nodal enlargement. Moderately large hiatal hernia is unchanged moderate long segment esophageal thickening. Lungs/Pleura: Filling of the bronchus intermedius and right lower lobe bronchus with material. Interval development of new cystic and partially fluid-filled structure in the right lower lobe  along the major fissure (image 75, series 2) 3.4 x 4.0 cm. Patchy areas of consolidative change in the right lung base. Background pulmonary emphysema with some ground-glass and interstitial thickening in the left chest as well. Process more pronounced on the right. Airways to the left chest without gross filling defect or debris. Small to moderate sub pulmonic right-sided pleural fluid collection. Small gas locules in addition to gas in fluid along the fissure are seen medially in the chest as well. Biapical scarring. Upper Abdomen: Signs of hepatic cysts and presumed renal cysts. Upper abdominal contents are incompletely imaged. Musculoskeletal: No signs of acute bone finding or evidence of destructive bone process. Osteopenia. Spinal degenerative changes. Multifocal lower thoracic and upper lumbar compression fractures are unchanged from recent comparison evaluation. IMPRESSION: 1. Filling of the bronchus intermedius and right lower lobe bronchus with material. Patchy areas of consolidative change in the right lung base. Findings concerning for pneumonitis/pneumonia due to aspiration with developing post infectious pneumatocele/abscess. 2. Moderate right-sided pleural effusion with loculated appearance and small locules of gas, additional explanation for the larger cystic area of may represent trapped gas and fluid within the major fissure. Correlate with any recent intervention/thoracentesis. 3. Background of pulmonary emphysema with some ground-glass and interstitial thickening in the left chest as well, to a lesser extent. 4. Large hiatal hernia with diffuse esophageal thickening, potentially related to esophagitis. 5. Worsening of sequela of pneumonitis in the right chest may explain enlarging mediastinal lymph nodes. Follow-up is suggested to ensure resolution. 6. Similar appearance of subcarinal nodal enlargement. 7. Multifocal lower thoracic and upper lumbar compression fractures are unchanged from recent  comparison evaluation. Aortic Atherosclerosis (ICD10-I70.0) and Emphysema (ICD10-J43.9). Electronically Signed   By: Zetta Bills M.D.   On: 05/21/2019 11:02   DG CHEST PORT 1 VIEW  Result Date: 06/04/2019 CLINICAL DATA:  Lung abscess.  Acute on chronic respiratory failure EXAM: PORTABLE CHEST 1 VIEW COMPARISON:  Five days ago FINDINGS: Hazy infiltrates the left base and right mid chest. This has the appearance of aspiration pneumonia by prior chest CT. There is worsening aeration on the right with elevated diaphragm and increased basal opacity. Biapical scarring. Stable heart size and mediastinal contours distorted by rotation. IMPRESSION: Aspiration pneumonia with worsening aeration on the right. Electronically Signed   By: Monte Fantasia M.D.   On: 06/04/2019 06:28   DG Chest Portable 1 View  Result Date: 05/30/2019 CLINICAL DATA:  Aspiration EXAM: PORTABLE CHEST 1 VIEW COMPARISON:  May 21, 2019 FINDINGS: Airspace opacities are again noted in the right mid lung zone and left lower lung zone. These are essentially unchanged from prior chest x-ray. There is no pneumothorax. No large pleural effusion. The heart size is stable. IMPRESSION: Again noted are findings of aspiration pneumonia, minimally improved from prior study. Electronically Signed   By: Constance Holster M.D.   On: 05/30/2019 17:44   DG Chest Port 1 View  Result Date: 05/21/2019 CLINICAL DATA:  Shortness of breath.  COVID-19 positive EXAM:  PORTABLE CHEST 1 VIEW COMPARISON:  May 09, 2019 and May 01, 2019 FINDINGS: There is persistent airspace opacity in the right mid lung and throughout the left lower lung regions, with slightly increased consolidation in the right mid lung essentially stable findings in the left base. There is bilateral apical pleural thickening, stable. There is a small right pleural effusion, stable. Heart size and pulmonary vascularity are normal. No adenopathy. There is an apparent hiatal hernia. Central  catheter tip is in the superior vena cava. No pneumothorax. No bone lesions. IMPRESSION: Airspace opacity bilaterally, with slightly greater consolidation in the right mid lung compared to most recent study and similar infiltrate in the left base region. Stable apical pleural thickening. Stable cardiac silhouette. Hiatal hernia present. Central catheter tip in superior vena cava. Electronically Signed   By: Bretta Bang III M.D.   On: 05/21/2019 07:55   DG CHEST PORT 1 VIEW  Result Date: 05/09/2019 CLINICAL DATA:  Sepsis, cough. EXAM: PORTABLE CHEST 1 VIEW COMPARISON:  May 06, 2019. FINDINGS: The heart size and mediastinal contours are within normal limits. No pneumothorax or pleural effusion is noted. Stable biapical scarring and pleural thickening is noted. Stable bilateral lung opacities are noted in the right lung base with left basilar region concerning for pneumonia. The visualized skeletal structures are unremarkable. IMPRESSION: Stable bilateral lung opacities are noted concerning for multifocal pneumonia. Electronically Signed   By: Lupita Raider M.D.   On: 05/09/2019 12:55   DG Swallowing Func-Speech Pathology  Result Date: 05/08/2019 Objective Swallowing Evaluation: Type of Study: MBS-Modified Barium Swallow Study  Patient Details Name: Shamari Lofquist MRN: [id number] Date of Birth: 26-Aug-1940 Today's Date: 05/08/2019 Time: SLP Start Time (ACUTE ONLY): 0845 -SLP Stop Time (ACUTE ONLY): 0905 SLP Time Calculation (min) (ACUTE ONLY): 20 min Past Medical History: Past Medical History: Diagnosis Date . CAD (coronary artery disease)  . COPD (chronic obstructive pulmonary disease) (HCC)  . Depression  . Essential hypertension  . Seizure disorder (HCC)  . Staphylococcus aureus bacteremia 05/06/2019 Past Surgical History: Past Surgical History: Procedure Laterality Date . BELOW KNEE LEG AMPUTATION Left  HPI: Adeeb Konecny is a 79 y.o. gentleman with history of CAD and PVD s/p BKA, Severe centrilobular  emphysema on home oxygen 2LNC, who lives in a SNF.He was previously diagnosed with COVID 19 pneumonia. Has a history of recurrent resistant ESBL producing organisms. Recent outpatient anitbiotics courses for concern for post-covid bacterial pneumonia. Having epistaxis which was refractory and prompted ED visit. He was started on CAP coverage with ceftriaxone/azithromycin and steroids for COPD exacerbation. Progressively worsening hypoxemia over the last 3 days PTA. Possible acute aspiration event (secretions?) and now on more oxygen. Also found to have blood cultures positive for Staph aureus - sensitivities pending.  No data recorded Assessment / Plan / Recommendation CHL IP CLINICAL IMPRESSIONS 05/08/2019 Clinical Impression Pt demonstrates normal swallow function for age. Strength WNL, no significant residue. There are instances of slightly slower laryngeal elevation in the swallow, but again WNL for age and did not result in any penetration or aspiraiton. Pt able to masticate soft solids with extra time despite missing dentition. Recommend pt continue regular/thin unrestricted diet with family assist to choose appropriate foods, discussed with dtr. No SLP f/u needed, will sign off.  SLP Visit Diagnosis Dysphagia, unspecified (R13.10) Attention and concentration deficit following -- Frontal lobe and executive function deficit following -- Impact on safety and function Mild aspiration risk   CHL IP TREATMENT RECOMMENDATION 05/08/2019 Treatment Recommendations No treatment  recommended at this time   No flowsheet data found. CHL IP DIET RECOMMENDATION 05/08/2019 SLP Diet Recommendations Regular solids;Thin liquid Liquid Administration via Cup;Straw Medication Administration Whole meds with liquid Compensations Slow rate;Small sips/bites Postural Changes --   No flowsheet data found.  CHL IP FOLLOW UP RECOMMENDATIONS 05/08/2019 Follow up Recommendations None   No flowsheet data found.     CHL IP ORAL PHASE 05/08/2019 Oral  Phase WFL Oral - Pudding Teaspoon -- Oral - Pudding Cup -- Oral - Honey Teaspoon -- Oral - Honey Cup -- Oral - Nectar Teaspoon -- Oral - Nectar Cup -- Oral - Nectar Straw -- Oral - Thin Teaspoon -- Oral - Thin Cup -- Oral - Thin Straw -- Oral - Puree -- Oral - Mech Soft -- Oral - Regular -- Oral - Multi-Consistency -- Oral - Pill -- Oral Phase - Comment --  CHL IP PHARYNGEAL PHASE 05/08/2019 Pharyngeal Phase WFL Pharyngeal- Pudding Teaspoon -- Pharyngeal -- Pharyngeal- Pudding Cup -- Pharyngeal -- Pharyngeal- Honey Teaspoon -- Pharyngeal -- Pharyngeal- Honey Cup -- Pharyngeal -- Pharyngeal- Nectar Teaspoon -- Pharyngeal -- Pharyngeal- Nectar Cup -- Pharyngeal -- Pharyngeal- Nectar Straw -- Pharyngeal -- Pharyngeal- Thin Teaspoon -- Pharyngeal -- Pharyngeal- Thin Cup -- Pharyngeal -- Pharyngeal- Thin Straw -- Pharyngeal -- Pharyngeal- Puree -- Pharyngeal -- Pharyngeal- Mechanical Soft -- Pharyngeal -- Pharyngeal- Regular -- Pharyngeal -- Pharyngeal- Multi-consistency -- Pharyngeal -- Pharyngeal- Pill -- Pharyngeal -- Pharyngeal Comment --  No flowsheet data found. Harlon Ditty, MA CCC-SLP Acute Rehabilitation Services Pager [phone] Office [phone] Claudine Mouton 05/08/2019, 10:04 AM              ECHOCARDIOGRAM LIMITED  Result Date: 05/07/2019    ECHOCARDIOGRAM LIMITED REPORT   Patient Name:   JEURY MCNAB Date of Exam: 05/07/2019 Medical Rec #:  [id number]  Height:       71.0 in Accession #:    [id number] Weight:       129.9 lb Date of Birth:  1940/05/21  BSA:          1.755 m Patient Age:    78 years   BP:           119/84 mmHg Patient Gender: M          HR:           103 bpm. Exam Location:  Inpatient Procedure: Limited Echo, Limited Color Doppler and Cardiac Doppler Indications:    Bacteremia 790.7 / R78.81  History:        Patient has no prior history of Echocardiogram examinations.                 CAD, COPD; Risk Factors:Hypertension. COVID19 Positive.                 Seizures.  Sonographer:     Elmarie Shiley Dance Referring Phys: [id number] CORNELIUS N VAN DAM IMPRESSIONS  1. Left ventricular ejection fraction, by estimation, is 60 to 65%. The left ventricle has normal function. The left ventricle has no regional wall motion abnormalities. Left ventricular diastolic parameters are consistent with Grade I diastolic dysfunction (impaired relaxation).  2. Right ventricular systolic function is normal. The right ventricular size is normal.  3. The mitral valve is degenerative. There is moderate calcification of the mitral valve leaflet(s). Moderate mitral subvalvular calcification. Moderate mitral subvalvular thickening/fibrosis. No evidence of mitral valve regurgitation. No evidence of mitral stenosis.  4. The aortic valve was not well visualized. Aortic valve regurgitation is not visualized. No  aortic stenosis is present.  5. The inferior vena cava is normal in size with greater than 50% respiratory variability, suggesting right atrial pressure of 3 mmHg.  6. Poorly visualized MV ad AV. Consider TEE if clinically indicated to rule out vegetation. FINDINGS  Left Ventricle: Left ventricular ejection fraction, by estimation, is 60 to 65%. The left ventricle has normal function. The left ventricle has no regional wall motion abnormalities. The left ventricular internal cavity size was normal in size. There is  no left ventricular hypertrophy. Indeterminate filling pressures. Right Ventricle: The right ventricular size is normal. No increase in right ventricular wall thickness. Right ventricular systolic function is normal. Left Atrium: Left atrial size was normal in size. Right Atrium: Right atrial size was normal in size. Pericardium: There is no evidence of pericardial effusion. Mitral Valve: The mitral valve is degenerative in appearance. There is moderate thickening of the mitral valve leaflet(s). There is moderate calcification of the mitral valve leaflet(s). Normal mobility of the mitral valve leaflets. Severe mitral  annular  calcification. Moderate mitral subvalvular calcification. Moderate mitral subvalvular thickening/fibrosis. No evidence of mitral valve stenosis. Tricuspid Valve: The tricuspid valve is normal in structure. Tricuspid valve regurgitation is not demonstrated. No evidence of tricuspid stenosis. Aortic Valve: The aortic valve was not well visualized. . There is moderate thickening and moderate calcification of the aortic valve. Aortic valve regurgitation is not visualized. No aortic stenosis is present. Moderate aortic valve annular calcification. There is moderate thickening of the aortic valve. There is moderate calcification of the aortic valve. Pulmonic Valve: The pulmonic valve was normal in structure. Pulmonic valve regurgitation is not visualized. No evidence of pulmonic stenosis. Aorta: The aortic root is normal in size and structure. Venous: The inferior vena cava is normal in size with greater than 50% respiratory variability, suggesting right atrial pressure of 3 mmHg. IAS/Shunts: No atrial level shunt detected by color flow Doppler.  LEFT VENTRICLE PLAX 2D LVIDd:         3.60 cm LVIDs:         3.00 cm LV PW:         0.90 cm LV IVS:        1.00 cm LVOT diam:     2.00 cm LV SV:         80.58 ml LV SV Index:   45.92 LVOT Area:     3.14 cm  RIGHT VENTRICLE RV Basal diam:  2.10 cm LEFT ATRIUM             Index       RIGHT ATRIUM          Index LA diam:        3.60 cm 2.05 cm/m  RA Area:     8.72 cm LA Vol (A2C):   39.5 ml 22.51 ml/m RA Volume:   13.20 ml 7.52 ml/m LA Vol (A4C):   19.5 ml 11.11 ml/m LA Biplane Vol: 27.8 ml 15.84 ml/m  AORTIC VALVE LVOT Vmax:   119.00 cm/s LVOT Vmean:  79.200 cm/s LVOT VTI:    0.256 m  AORTA Ao Root diam: 3.60 cm Ao Asc diam:  3.20 cm MITRAL VALVE MV Area (PHT): 2.34 cm     SHUNTS MV Decel Time: 324 msec     Systemic VTI:  0.26 m MV E velocity: 98.00 cm/s   Systemic Diam: 2.00 cm MV A velocity: 154.00 cm/s MV E/A ratio:  0.64 Armanda Magic MD Electronically signed by  Armanda Magic MD  Signature Date/Time: 05/07/2019/12:40:44 PM    Final    [place] EKG SITE RITE  Result Date: 05/09/2019 If Site Rite image not attached, placement could not be confirmed due to current cardiac rhythm.     Subjective: Alert, eating some chocolate..  Denies pain   Discharge Exam: Vitals:   06/06/19 0417 06/06/19 0921  BP: (!) 86/49   Pulse: (!) 107   Resp: 16   Temp: 99.1 F (37.3 C)   SpO2: 98% 94%     General: Pt is alert, awake, not in acute distress Cardiovascular: RRR, S1/S2 +, no rubs, no gallops Respiratory: decreased breath sound Abdominal: Soft, NT, ND, bowel sounds + Extremities: no edema, no cyanosis, left BKA    The results of significant diagnostics from this hospitalization (including imaging, microbiology, ancillary and laboratory) are listed below for reference.     Microbiology: No results found for this or any previous visit (from the past 240 hour(s)).   Labs: BNP (last 3 results) Recent Labs    05/21/19 0736  BNP 77.0   Basic Metabolic Panel: Recent Labs  Lab 05/30/19 1718 05/30/19 1718 05/31/19 0500 05/31/19 0500 06/01/19 0154 06/02/19 0142 06/03/19 0336 06/04/19 0342 06/05/19 0407  NA 137   < > 138   < > 136 136 137 137 138  K 4.2   < > 4.9   < > 4.3 4.3 4.5 5.2* 4.6  CL 104   < > 108   < > 103 103 102 100 98  CO2 25   < > 27   < > 32  GLUCOSE 132*   < > 96   < > 123* 84 88 92 95  BUN 27*   < > 32*   < > CREATININE 0.66   < > 0.47*   < > 0.54* 0.42* 0.49* 0.56* 0.52*  CALCIUM 8.4*   < > 7.6*   < > 8.1* 8.1* 8.1* 8.5* 8.6*  MG 2.0  --  1.8  --   --   --   --   --   --    < > = values in this interval not displayed.   Liver Function Tests: Recent Labs  Lab 05/31/19 0500  AST 16  ALT 11  ALKPHOS 51  BILITOT 0.7  PROT 5.0*  ALBUMIN 2.3*   No results for input(s): LIPASE, AMYLASE in the last 168 hours. No results for input(s): AMMONIA in the last 168 hours. CBC: Recent Labs  Lab  05/30/19 1718 05/30/19 2230 05/31/19 0500 05/31/19 1732 06/01/19 0154 06/01/19 0154 06/01/19 1730 06/02/19 0142 06/03/19 0336 06/04/19 0342 06/05/19 0407  WBC 9.3  --  9.6  --  6.8  --   --  6.0 6.7 8.7 9.3  NEUTROABS 6.2  --  6.0  --   --   --   --   --   --   --   --   HGB 9.3*   < > 7.4*   < > 7.8*   < > 9.1* 8.7* 7.9* 8.9* 8.9*  HCT 30.3*   < > 24.2*   < > 25.0*   < > 29.2* 27.6* 26.1* 29.1* 29.2*  MCV 98.4  --  98.8  --  96.2  --   --  93.9 94.9 96.4 95.1  PLT 424*  --  356  --  319  --   --  340 380 451* 496*   < > =  values in this interval not displayed.   Cardiac Enzymes: No results for input(s): CKTOTAL, CKMB, CKMBINDEX, TROPONINI in the last 168 hours. BNP: Invalid input(s): POCBNP CBG: No results for input(s): GLUCAP in the last 168 hours. D-Dimer No results for input(s): DDIMER in the last 72 hours. Hgb A1c No results for input(s): HGBA1C in the last 72 hours. Lipid Profile No results for input(s): CHOL, HDL, LDLCALC, TRIG, CHOLHDL, LDLDIRECT in the last 72 hours. Thyroid function studies No results for input(s): TSH, T4TOTAL, T3FREE, THYROIDAB in the last 72 hours.  Invalid input(s): FREET3 Anemia work up No results for input(s): VITAMINB12, FOLATE, FERRITIN, TIBC, IRON, RETICCTPCT in the last 72 hours. Urinalysis    Component Value Date/Time   COLORURINE YELLOW 06/03/2019 1815   APPEARANCEUR CLEAR 06/03/2019 1815   LABSPEC 1.013 06/03/2019 1815   PHURINE 6.0 06/03/2019 1815   GLUCOSEU NEGATIVE 06/03/2019 1815   HGBUR NEGATIVE 06/03/2019 1815   BILIRUBINUR NEGATIVE 06/03/2019 1815   KETONESUR NEGATIVE 06/03/2019 1815   PROTEINUR NEGATIVE 06/03/2019 1815   NITRITE NEGATIVE 06/03/2019 1815   LEUKOCYTESUR NEGATIVE 06/03/2019 1815   Sepsis Labs Invalid input(s): PROCALCITONIN,  WBC,  LACTICIDVEN Microbiology No results found for this or any previous visit (from the past 240 hour(s)).   Time coordinating discharge: 40 minutes  SIGNED:   Alba Cory, MD  [facility]

## 2019-06-06 NOTE — TOC Progression Note (Signed)
Transition of Care [facility]) - Progression Note    Patient Details  Name: Paul Suarez MRN: [id number] Date of Birth: [birth year]  Transition of Care [facility]) CM/SW Contact  Darleene Cleaver, [place] Phone Number: 06/06/2019, 10:00 AM  Clinical Narrative:    CSW was informed that bed is available at [facility] the [place], CSW notified physician.  Patient can transfer today, once discharge summary is complete and patient's daughter's complete paperwork.   Expected Discharge Plan: Skilled Nursing Facility Barriers to Discharge: Continued Medical Work up  Expected Discharge Plan and Services Expected Discharge Plan: Skilled Nursing Facility In-house Referral: Clinical Social Work   Post Acute Care Choice: Skilled Nursing Facility Living arrangements for the past 2 months: Skilled Nursing Facility Expected Discharge Date: 06/06/19                                     Social Determinants of Health (SDOH) Interventions    Readmission Risk Interventions Readmission Risk Prevention Plan 05/22/2019  Transportation Screening Complete  PCP or Specialist Appt within 3-5 Days Complete  HRI or Home Care Consult Complete  Social Work Consult for Recovery Care Planning/Counseling Complete  Palliative Care Screening Complete  Medication Review [employer]) Referral to Pharmacy

## 2019-06-06 NOTE — Progress Notes (Signed)
Daily Progress Note   Patient Name: Paul Suarez       Date: 06/06/2019 DOB: [birth year]  Age: 79 y.o. MRN#: [id number] Attending Physician: Alba Cory, MD Primary Care Physician: Pecola Lawless, MD Admit Date: 05/30/2019  Reason for Consultation/Follow-up: Establishing goals of care and Hospice Evaluation  Subjective: I saw and examined Paul Suarez today.  He was awake and alert but appeared more tired and less interactive today.  Discussed with his daughter at the bedside who reports that family has made decision to transition to Hospice Home in [facility] for end of life care.  Length of Stay: 7  Current Medications: Scheduled Meds:  . amoxicillin-clavulanate  1 tablet Oral Q12H  . vitamin C  500 mg Oral Daily  . chlorhexidine  15 mL Mouth Rinse BID  . Chlorhexidine Gluconate Cloth  6 each Topical Daily  . divalproex  500 mg Oral QHS  . feeding supplement (ENSURE ENLIVE)  237 mL Oral TID BM  . loratadine  10 mg Oral Daily  . mouth rinse  15 mL Mouth Rinse q12n4p  . multivitamin with minerals  1 tablet Oral Daily  . pantoprazole  40 mg Oral Daily  . sertraline  25 mg Oral Daily  . sodium chloride  1 spray Nasal BID  . sodium chloride flush  3 mL Intravenous Q12H  . sulfamethoxazole-trimethoprim  1 tablet Oral Q12H  . umeclidinium bromide  1 puff Inhalation Daily    Continuous Infusions:   PRN Meds: acetaminophen **OR** acetaminophen, albuterol, lidocaine, lidocaine, lidocaine-EPINEPHrine, melatonin, morphine injection, morphine CONCENTRATE, ondansetron (ZOFRAN) IV, oxymetazoline, silver nitrate applicators, Triple Antibiotic  Physical Exam         General: Frail appearing.  Follows conversation well but more frail appearing today. Resp: Slightly increased work of  breathing.  Desats with talking.  On nasal cannula 4 L. CV: Tachycardic.  No murmur. Extremities: No edema.  Moves 4 extremities  Vital Signs: BP (!) 86/49 (BP Location: Left Arm)   Pulse (!) 107   Temp 99.1 F (37.3 C) (Oral)   Resp 16   Ht 5\' 11"  (1.803 m)   Wt 62.5 kg   SpO2 94%   BMI 19.22 kg/m  SpO2: SpO2: 94 % O2 Device: O2 Device: Nasal Cannula O2 Flow Rate: O2 Flow Rate (L/min): 5 L/min  Intake/output summary:   Intake/Output Summary (Last 24 hours) at 06/06/2019 1116 Last data filed at 06/06/2019 0518 Gross per 24 hour  Intake 160 ml  Output 1625 ml  Net -1465 ml   LBM: Last BM Date: 06/05/19 Baseline Weight: Weight: 57.5 kg Most recent weight: Weight: 62.5 kg       Palliative Assessment/Data:      Patient Active Problem List   Diagnosis Date Noted  . Epistaxis 05/31/2019  . COPD (chronic obstructive pulmonary disease) (HCC) 05/31/2019  . Abscess of lung with pneumonia (HCC)   . Aspiration pneumonia (HCC) 05/21/2019  . Anemia of chronic disease 05/21/2019  . Protein-calorie malnutrition, severe 05/10/2019  . Pressure injury of skin 05/08/2019  . Staphylococcus aureus bacteremia 05/06/2019  . Severe sepsis (HCC) 05/02/2019  . Acute on chronic respiratory failure with hypoxia (HCC) 05/02/2019  . AKI (acute kidney injury) (HCC) 05/02/2019  . Acute blood loss anemia 05/02/2019  . Tachycardia 04/26/2019  . AMS (altered mental status) 02/13/2019  . UTI due to extended-spectrum beta lactamase (ESBL) producing Escherichia coli 02/12/2019  . CAD (coronary artery disease) 02/12/2019  . Essential hypertension 02/12/2019  . Vitamin D deficiency 02/12/2019  . Seasonal allergies 02/12/2019  . Muscle spasm/History of left BKA 02/12/2019  . Insomnia 02/12/2019  . COPD with acute exacerbation (HCC) 02/12/2019  . Major depression, recurrent (HCC) 02/12/2019  . Hypokalemia 02/12/2019  . Constipation 02/12/2019  . Seizure disorder [facility]) 02/12/2019    Palliative  Care Assessment & Plan   Patient Profile: 79 y.o. male  admitted on 05/30/2019    Paul Suarez a 79 y.o.malewith a history of chronic hypoxic respiratory failure due to COPD, CAD, HTN, seizure disorder, and recent aspiration pneumonia complicated by hemoptysis who presented from SNF for a nose bleed. He had been hospitalized 2/26 - 3/7 for pneumonia, then again 3/8 - 3/11 for recurrent aspiration pneumonia suspected to be due to large hiatal hernia (not dysphagia) complicated by lung abscess, ultimately discharged on augmentin and bactrim to end 4/1. He had hemoptysis during that admission and aspirin was held, with plavix continued. On admission this time, the right nare was packed, 1u PRBCs given and ENT consulted for recurrent bleeding which resolved with holding plavix and repacking. Hgb remained low so an additional unit of RBCs given 3/19.  Recommendations/Plan:  DNR/DNI  Plan for transition to residential hospice at [facility] in [place].  He appears stable for transfer today.  Code Status:    Code Status Orders  (From admission, onward)         Start     Ordered   05/30/19 1954  Do not attempt resuscitation (DNR)  Continuous    Question Answer Comment  In the event of cardiac or respiratory ARREST Do not call a "code blue"   In the event of cardiac or respiratory ARREST Do not perform Intubation, CPR, defibrillation or ACLS   In the event of cardiac or respiratory ARREST Use medication by any route, position, wound care, and other measures to relive pain and suffering. May use oxygen, suction and manual treatment of airway obstruction as needed for comfort.   Comments Discussed code status with patient's daughter [name] and he is a DNR      05/30/19 1955        Code Status History    Date Active Date Inactive Code Status Order ID Comments User Context   05/21/2019 1338 05/24/2019 1609 DNR [id number]  Lucile Shutters, MD ED   05/06/2019 1605 05/11/2019  2058 DNR [id number]  Spero Geralds, MD Inpatient   05/02/2019 2024 05/06/2019 1605 Full Code [id number]  Vianne Bulls, MD ED   Advance Care Planning Activity    Advance Directive Documentation     Most Recent Value  Type of Advance Directive  Healthcare Power of Attorney  Pre-existing out of facility DNR order (yellow form or pink MOST form)  --  "MOST" Form in Place?  --       Prognosis:  Less than 2 weeks most likely.  He does appear to be somewhat improved, however, I fear this is short-lived as he is at high risk for continued decompensation with aspiration and recurrence of bleeding.  Discharge Planning:  Hospice facility  Care plan was discussed with patient, daughters  Thank you for allowing the Palliative Medicine Team to assist in the care of this patient.   Time In:  1000 Time Out: 1015 Total Time 15 Prolonged Time Billed  no       Greater than 50%  of this time was spent counseling and coordinating care related to the above assessment and plan.  Micheline Rough, MD  Please contact Palliative Medicine Team phone at [phone] for questions and concerns.

## 2019-06-06 NOTE — Progress Notes (Signed)
Ambulance arrived to facility for transportation to Hospice facility. VSS. Daughters given pt belongings.

## 2019-06-06 NOTE — Progress Notes (Signed)
Report called to Vernona Rieger at [facility]. VSS. Peri-care and bed bath given. Condom catheter clean and intact. Wound dressings changed and preventative foam dressings applied. Will cont to mx.

## 2019-07-16 ENCOUNTER — Encounter (HOSPITAL_BASED_OUTPATIENT_CLINIC_OR_DEPARTMENT_OTHER): Payer: Medicare Other | Attending: Internal Medicine | Admitting: Internal Medicine

## 2019-07-16 DIAGNOSIS — L89320 Pressure ulcer of left buttock, unstageable: Secondary | ICD-10-CM | POA: Insufficient documentation

## 2019-07-16 DIAGNOSIS — L89610 Pressure ulcer of right heel, unstageable: Secondary | ICD-10-CM | POA: Insufficient documentation

## 2019-07-16 DIAGNOSIS — Z7902 Long term (current) use of antithrombotics/antiplatelets: Secondary | ICD-10-CM | POA: Insufficient documentation

## 2019-07-16 DIAGNOSIS — J449 Chronic obstructive pulmonary disease, unspecified: Secondary | ICD-10-CM | POA: Insufficient documentation

## 2019-07-16 DIAGNOSIS — Z8249 Family history of ischemic heart disease and other diseases of the circulatory system: Secondary | ICD-10-CM | POA: Diagnosis not present

## 2019-07-16 DIAGNOSIS — I2510 Atherosclerotic heart disease of native coronary artery without angina pectoris: Secondary | ICD-10-CM | POA: Insufficient documentation

## 2019-07-16 DIAGNOSIS — L97518 Non-pressure chronic ulcer of other part of right foot with other specified severity: Secondary | ICD-10-CM | POA: Diagnosis not present

## 2019-07-16 DIAGNOSIS — L89210 Pressure ulcer of right hip, unstageable: Secondary | ICD-10-CM | POA: Diagnosis not present

## 2019-07-16 DIAGNOSIS — L89310 Pressure ulcer of right buttock, unstageable: Secondary | ICD-10-CM | POA: Insufficient documentation

## 2019-07-16 DIAGNOSIS — Z89512 Acquired absence of left leg below knee: Secondary | ICD-10-CM | POA: Diagnosis not present

## 2019-07-16 DIAGNOSIS — G40909 Epilepsy, unspecified, not intractable, without status epilepticus: Secondary | ICD-10-CM | POA: Diagnosis not present

## 2019-07-16 DIAGNOSIS — I10 Essential (primary) hypertension: Secondary | ICD-10-CM | POA: Diagnosis not present

## 2019-07-16 DIAGNOSIS — L89510 Pressure ulcer of right ankle, unstageable: Secondary | ICD-10-CM | POA: Insufficient documentation

## 2019-07-16 DIAGNOSIS — Z833 Family history of diabetes mellitus: Secondary | ICD-10-CM | POA: Insufficient documentation

## 2019-07-16 DIAGNOSIS — Z87891 Personal history of nicotine dependence: Secondary | ICD-10-CM | POA: Insufficient documentation

## 2019-07-16 NOTE — Progress Notes (Signed)
Paul Suarez ([id number]) Visit Report for 07/16/2019 Chief Complaint Document Details Patient Name: Date of Service: Paul Suarez [facility] 07/16/2019 10:30 A M Medical Record Number: [id number] Patient Account Number: [id number] Date of Birth/Sex: Treating RN: 20-Jun-1940 (79 y.o. Paul Suarez Primary Care Provider: Pecola Lawless Other Clinician: Referring Provider: Treating Provider/Extender: Roselie Awkward in Treatment: 0 Information Obtained from: Patient Chief Complaint 07/16/2019; patient is here for review of multiple wounds on the right foot and ankle and on the right and left buttock. Electronic Signature(s) Signed: 07/16/2019 5:33:20 PM By: Baltazar Najjar MD Entered By: Baltazar Najjar on 07/16/2019 13:03:09 -------------------------------------------------------------------------------- HPI Details Patient Name: Date of Service: Paul Suarez [facility] 07/16/2019 10:30 A M Medical Record Number: [id number] Patient Account Number: [id number] Date of Birth/Sex: Treating RN: Sep 03, 1940 (78 y.o. Paul Suarez Primary Care Provider: Pecola Lawless Other Clinician: Referring Provider: Treating Provider/Extender: Roselie Awkward in Treatment: 0 History of Present Illness HPI Description: ADMISSION 07/16/2019 This is a very disabled 79 year old man who is a longstanding resident of [place] skilled facility. He comes in with his daughter who is his primary informant. The patient was hospitalized from 05/30/2019 through 06/06/2019 prompted by epistaxis which could not be controlled. He was admitted with acute on chronic respiratory failure. His Plavix was put on hold. He was noted to have unstageable wounds on his right heel, stage II on the right hip a deep tissue injury on his sacrum and the right ischial tuberosity ulcer. He was sent to hospice in [facility] where he lasted a day and then he was returned to [place]. There he is developed  additional wounds to his right buttock and left ischial tuberosity area. He also has wounds on the right foot, right ankle right heel and the right lateral foot The patient is very disabled beyond his actual list of diagnoses. 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 His daughter states that the only one he had in the hospital was an area on the right buttock although I think there was actually more described than that I will need to look through the discharge summary. His daughter states that the doctor hospice told him he would not qualify. 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 I believe there is been some weight loss although I cannot quantitate this. ABI in our clinic was 0.75 on the right Electronic Signature(s) Signed: 07/16/2019 5:33:20 PM By: Baltazar Najjar MD Entered By: Baltazar Najjar on 07/16/2019 13:09:03 -------------------------------------------------------------------------------- Physical Exam Details Patient Name: Date of Service: Paul Suarez [facility] 07/16/2019 10:30 A M Medical Record Number: [id number] Patient Account Number: [id number] Date of Birth/Sex: Treating RN: 18-Oct-1940 (79 y.o. Paul Suarez Primary Care Provider: Pecola Lawless Other Clinician: Referring Provider: Treating Provider/Extender: Roselie Awkward in Treatment: 0 Constitutional Patient is hypertensive.. Pulse regular and within target range for patient.Marland Kitchen Respirations regular, non-labored and within target range.. Temperature is normal and within the target range for the patient.Marland Kitchen Appears in no distress. Eyes Conjunctivae clear. No discharge.no icterus. Respiratory Oxygen on.. Cardiovascular Could not get out the popliteal pulse on the right. He does  have a dorsalis pedis pulse on the right I could not hear the feel the posterior tibial. Gastrointestinal (GI) Signs of  weight loss no masses. 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 Psychiatric Patient is awake alert. Notes Wound exam The patient has extensive wounds on the lateral part of his foot presumably mostly pressure this includes the right Achilles, right calcaneus, lateral foot. All of these covered in black eschar essentially unstageable He has an area on the right greater trochanter, right ischium and right glued although these are unstageable as well the area on the left ischium was difficult to see because of positioning however I believe this is technically unstageable as well. There is no evidence of infection currently in any wound area Electronic Signature(s) Signed: 07/16/2019 5:33:20 PM By: Baltazar Najjarobson, Keyleen Cerrato MD Entered By: Baltazar Najjarobson, Shamicka Inga on 07/16/2019 13:11:25 -------------------------------------------------------------------------------- Physician Orders Details Patient Name: Date of Service: [facility]16[facility] 10:30 A M Medical Record Number: [id number] Patient Account Number: [id number] Date of Birth/Sex: Treating RN: 10/31/1940 (79 y.o. Paul KollerM) Lynch, Shatara Primary Care Provider: Pecola LawlessHopper, William F Other Clinician: Referring Provider: Treating Provider/Extender: Roselie Awkwardobson, Makailey Hodgkin Hopper, William F Weeks in Treatment: 0 Verbal / Phone Orders: No Diagnosis Coding Follow-up Appointments ppointment in 2 weeks. - ****EXTRA TIME(75 MIN) - ROOM 5**** Return A Dressing Change Frequency Change dressing every day. - all wounds Skin Barriers/Peri-Wound Care Skin Prep Wound Cleansing Clean wound with Normal Saline. - or wound cleanser to all wounds Primary Wound Dressing Wound #1 Right,Lateral Ankle Medihoney gel Wound #2 Right Calcaneus Medihoney gel Wound #3 Right,Lateral Foot Medihoney gel Wound #4 Left Upper Arm Xeroform Wound  #5 Right Trochanter Santyl Ointment Wound #6 Right Ischium Santyl Ointment Wound #7 Right Gluteus Santyl Ointment Wound #8 Left Ischium Santyl Ointment Secondary Dressing Foam Border - or ABD pad and tape to all gluteal/hip/ischium wounds Wound #1 Right,Lateral Ankle Kerlix/Rolled Gauze Dry Gauze Wound #2 Right Calcaneus Kerlix/Rolled Gauze Dry Gauze Heel Cup Wound #3 Right,Lateral Foot Kerlix/Rolled Gauze Dry Gauze Off-Loading Low air-loss mattress (Group 2) Gel wheelchair cushion Turn and reposition every 2 hours Electronic Signature(s) Signed: 07/16/2019 5:27:06 PM By: Zandra AbtsLynch, Shatara RN, BSN Signed: 07/16/2019 5:33:20 PM By: Baltazar Najjarobson, Tramell Piechota MD Entered By: Zandra AbtsLynch, Shatara on 07/16/2019 12:53:07 -------------------------------------------------------------------------------- Problem List Details Patient Name: Date of Service: [facility] 10[facility]30 A M Medical Record Number: [id number] Patient Account Number: [id number] Date of Birth/Sex: Treating RN: 10/31/1940 (79 y.o. 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, unstageable 07/16/2019 No Yes L89.610 Pressure ulcer of right heel, unstageable 07/16/2019 No Yes L97.518 Non-pressure chronic ulcer of other part of right foot with other specified 07/16/2019 No Yes severity L89.210 Pressure ulcer of right hip, unstageable 07/16/2019 No Yes L89.310 Pressure ulcer of right buttock, unstageable 07/16/2019 No Yes L89.320 Pressure ulcer of left buttock, unstageable 07/16/2019 No Yes Inactive Problems Resolved Problems Electronic Signature(s) Signed: 07/16/2019 5:33:20 PM By: Baltazar Najjarobson, Charlsey Moragne MD Entered By: Baltazar Najjarobson, Teofilo Lupinacci on 07/16/2019  13:02:14 -------------------------------------------------------------------------------- Progress Note Details Patient Name: Date of Service: [facility] O D, [facility] 07/16/2019 10:30 A M Medical Record Number: [id number] Patient Account Number: [id number] Date of Birth/Sex: Treating RN: 10/31/1940 (79 y.o. Paul KollerM) Lynch, Shatara Primary Care Provider: Pecola LawlessHopper, William F Other Clinician: Referring Provider: Treating Provider/Extender: Roselie Awkwardobson, Evelin Cake Hopper, William F Weeks in Treatment: 0 Subjective Chief Complaint Information obtained from Patient 07/16/2019; patient is here for review of multiple wounds on the  right foot and ankle and on the right and left buttock. History of Present Illness (HPI) ADMISSION 07/16/2019 This is a very disabled 79 year old man who is a longstanding resident of [place] skilled facility. He comes in with his daughter who is his primary informant. The patient was hospitalized from 05/30/2019 through 06/06/2019 prompted by epistaxis which could not be controlled. He was admitted with acute on chronic respiratory failure. His Plavix was put on hold. He was noted to have unstageable wounds on his right heel, stage II on the right hip a deep tissue injury on his sacrum and the right ischial tuberosity ulcer. He was sent to hospice in [facility] where he lasted a day and then he was returned to [place]. There he is developed additional wounds to his right buttock and left ischial tuberosity area. He also has wounds on the right foot, right ankle right heel and the right lateral foot The patient is very disabled beyond his actual list of diagnoses. 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 His daughter states that the only one he had in the hospital was an area on the right buttock although I think there was actually more described than that I will need to look through the  discharge summary. His daughter states that the doctor hospice told him he would not qualify. 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 I believe there is been some weight loss although I cannot quantitate this. ABI in our clinic was 0.75 on the right Patient History Information obtained from Patient. Allergies No Known Allergies Family History Diabetes - Mother, Heart Disease - Mother, No family history of Cancer, Hypertension, Kidney Disease, Lung Disease, Seizures, Stroke, Thyroid Problems, Tuberculosis. Social History Former smoker, Marital Status - Widowed, Alcohol Use - Rarely, Drug Use - No History, Caffeine Use - Rarely. Medical History Eyes Denies history of Cataracts, Glaucoma, Optic Neuritis Ear/Nose/Mouth/Throat Denies history of Chronic sinus problems/congestion, Middle ear problems Hematologic/Lymphatic Denies history of Anemia, Hemophilia, Human Immunodeficiency Virus, Lymphedema, Sickle Cell Disease Respiratory Patient has history of Chronic Obstructive Pulmonary Disease (COPD) Denies history of Aspiration, Asthma, Pneumothorax, Sleep Apnea, Tuberculosis Cardiovascular Patient has history of Coronary Artery Disease, Hypertension Denies history of Angina, Arrhythmia, Congestive Heart Failure, Deep Vein Thrombosis, Hypotension, Myocardial Infarction, Peripheral Arterial Disease, Peripheral Venous Disease, Phlebitis, Vasculitis Gastrointestinal Denies history of Cirrhosis , Colitis, Crohnoos, Hepatitis A, Hepatitis B, Hepatitis C Endocrine Denies history of Type I Diabetes, Type II Diabetes Genitourinary Denies history of End Stage Renal Disease Immunological Denies history of Lupus Erythematosus, Raynaudoos, Scleroderma Integumentary (Skin) Denies history of History of Burn Musculoskeletal Denies history of Gout, Rheumatoid Arthritis, Osteoarthritis,  Osteomyelitis Neurologic Denies history of Dementia, Neuropathy, Quadriplegia, Paraplegia, Seizure Disorder Oncologic Denies history of Received Chemotherapy, Received Radiation Psychiatric Denies history of Anorexia/bulimia, Confinement Anxiety Review of Systems (ROS) Constitutional Symptoms (General Health) Denies complaints or symptoms of Fatigue, Fever, Chills, Marked Weight Change. Eyes Denies complaints or symptoms of Dry Eyes, Vision Changes, Glasses / Contacts. Ear/Nose/Mouth/Throat Denies complaints or symptoms of Chronic sinus problems or rhinitis. Respiratory Complains or has symptoms of Chronic or frequent coughs, Shortness of Breath. Cardiovascular Denies complaints or symptoms of Chest pain. Gastrointestinal Denies complaints or symptoms of Frequent diarrhea, Nausea, Vomiting. Endocrine Denies complaints or symptoms of Heat/cold intolerance. Genitourinary Denies complaints or symptoms of Frequent  urination. Integumentary (Skin) Complains or has symptoms of Wounds. Musculoskeletal Denies complaints or symptoms of Muscle Pain, Muscle Weakness. Neurologic Denies complaints or symptoms of Numbness/parasthesias. Psychiatric Denies complaints or symptoms of Claustrophobia, Suicidal. Objective Constitutional Patient is hypertensive.. Pulse regular and within target range for patient.Marland Kitchen Respirations regular, non-labored and within target range.. Temperature is normal and within the target range for the patient.Marland Kitchen Appears in no distress. Vitals Time Taken: 11:31 AM, Height: 71 in, Source: Stated, Weight: 126 lbs, Source: Stated, BMI: 17.6, Temperature: 98.4 F, Pulse: 107 bpm, Respiratory Rate: 22 breaths/min, Blood Pressure: 147/75 mmHg. Eyes Conjunctivae clear. No discharge.no icterus. Respiratory Oxygen on.. Cardiovascular Could not get out the popliteal pulse on the right. He does have a dorsalis pedis pulse on the right I could not hear the feel the posterior  tibial. Gastrointestinal (GI) Signs of weight loss no masses. 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 Psychiatric Patient is awake alert. General Notes: Wound exam ooThe patient has extensive wounds on the lateral part of his foot presumably mostly pressure this includes the right Achilles, right calcaneus, lateral foot. All of these covered in black eschar essentially unstageable ooHe has an area on the right greater trochanter, right ischium and right glued although these are unstageable as well the area on the left ischium was difficult to see because of positioning however I believe this is technically unstageable as well. ooThere is no evidence of infection currently in any wound area Integumentary (Hair, Skin) Wound #1 status is Open. Original cause of wound was Gradually Appeared. The wound is located on the Right,Lateral Ankle. The wound measures 2cm length x 1.5cm width x 0.1cm depth; 2.356cm^2 area and 0.236cm^3 volume. There is no tunneling or undermining noted. There is a small amount of serosanguineous drainage noted. There is no granulation within the wound bed. There is a large (67-100%) amount of necrotic tissue within the wound bed including Eschar. Wound #2 status is Open. Original cause of wound was Gradually Appeared. The wound is located on the Right Calcaneus. The wound measures 2cm length x 2cm width x 0.2cm depth; 3.142cm^2 area and 0.628cm^3 volume. There is Fat Layer (Subcutaneous Tissue) Exposed exposed. There is no tunneling or undermining noted. There is a small amount of serosanguineous drainage noted. There is no granulation within the wound bed. There is a large (67-100%) amount of necrotic tissue within the wound bed including Eschar. Wound #3 status is Open. Original cause of wound was Gradually Appeared. The wound is located on the Right,Lateral Foot. The wound measures 2cm length x 2cm width x 0.1cm depth; 3.142cm^2 area and  0.314cm^3 volume. There is Fat Layer (Subcutaneous Tissue) Exposed exposed. There is no tunneling or undermining noted. There is a medium amount of serosanguineous drainage noted. There is medium (34-66%) pink granulation within the wound bed. There is a medium (34-66%) amount of necrotic tissue within the wound bed including Adherent Slough. Wound #4 status is Open. Original cause of wound was Gradually Appeared. The wound is located on the Left Upper Arm. The wound measures 3.5cm length x 0.2cm width x 0.1cm depth; 0.55cm^2 area and 0.055cm^3 volume. There is Fat Layer (Subcutaneous Tissue) Exposed exposed. There is no tunneling or undermining noted. There is a small amount of serosanguineous drainage noted. There is medium (34-66%) red granulation within the wound bed. Wound #5 status is Open. Original cause of wound was Gradually Appeared. The wound is located on the Right Trochanter. The wound measures 5.5cm length x 2cm width  x 0.1cm depth; 8.639cm^2 area and 0.864cm^3 volume. There is Fat Layer (Subcutaneous Tissue) Exposed exposed. There is no tunneling or undermining noted. There is a medium amount of serosanguineous drainage noted. There is small (1-33%) red granulation within the wound bed. There is a large (67-100%) amount of necrotic tissue within the wound bed including Adherent Slough. Wound #6 status is Open. Original cause of wound was Gradually Appeared. The wound is located on the Right Ischium. The wound measures 4.5cm length x 4.5cm width x 0.1cm depth; 15.904cm^2 area and 1.59cm^3 volume. There is Fat Layer (Subcutaneous Tissue) Exposed exposed. There is no tunneling or undermining noted. There is a medium amount of serosanguineous drainage noted. There is medium (34-66%) pink granulation within the wound bed. There is a medium (34-66%) amount of necrotic tissue within the wound bed including Adherent Slough. Wound #7 status is Open. Original cause of wound was Gradually Appeared.  The wound is located on the Right Gluteus. The wound measures 6cm length x 4cm width x 0.1cm depth; 18.85cm^2 area and 1.885cm^3 volume. There is no tunneling or undermining noted. There is a medium amount of serosanguineous drainage noted. There is medium (34-66%) pink granulation within the wound bed. There is a medium (34-66%) amount of necrotic tissue within the wound bed including Eschar and Adherent Slough. Wound #8 status is Open. Original cause of wound was Gradually Appeared. The wound is located on the Left Ischium. The wound measures 5cm length x 2cm width x 0.1cm depth; 7.854cm^2 area and 0.785cm^3 volume. There is no tunneling or undermining noted. There is a medium amount of serosanguineous drainage noted. There is no granulation within the wound bed. There is a large (67-100%) amount of necrotic tissue within the wound bed including Adherent Slough. 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 - ****EXTRA TIME(75 MIN) - ROOM 5**** Dressing Change Frequency: Change dressing every day. - all wounds Skin Barriers/Peri-Wound Care: Skin Prep Wound Cleansing: Clean wound with Normal Saline. - or wound cleanser to all wounds Primary Wound Dressing: Wound #1 Right,Lateral Ankle: Medihoney gel Wound #2 Right Calcaneus: Medihoney gel Wound #3 Right,Lateral Foot: Medihoney gel Wound #4 Left Upper Arm: Xeroform Wound #5 Right Trochanter: Santyl Ointment Wound #6 Right Ischium: Santyl Ointment Wound #7 Right Gluteus: Santyl Ointment Wound #8 Left Ischium: Santyl Ointment Secondary Dressing: Foam Border - or ABD pad and tape to  all gluteal/hip/ischium wounds Wound #1 Right,Lateral Ankle: Kerlix/Rolled Gauze Dry Gauze Wound #2 Right Calcaneus: Kerlix/Rolled Gauze Dry Gauze Heel Cup Wound #3 Right,Lateral Foot: Kerlix/Rolled Gauze Dry Gauze Off-Loading: Low air-loss mattress (Group 2) Gel wheelchair cushion Turn and reposition every 2 hours 1. All of these wounds are unstageable 2. After careful review I suspect the wounds on the right lateral foot are pressure areas although I cannot exactly rule out a component of significant PAD his dorsalis pedis pulses palpable ABI in our clinic was 0.75 it would seem to me in discussion with the daughter to be important to verify what the blood flow is although I do not believe that he would be a candidate for even endovascular attempts to revascularize him. And ABI TBI's and lower extremity lower leg Dopplers would be helpful 3. Unstageable pressure areas on the  right hip, right ischium right buttock and left ischial tuberosities. 4. The daughters account of what was and what was not present may not be accurate. She states the only area that was present was the right ischial tuberosity whereas I believe an area on the right heel, right hip and the right ischial tuberosity were described when he left the hospital I will need to verify my notes on this. Nevertheless I think it is your if you double that all of these have worsened. 5. The patient looks as though he has lost weight, he is aspirating on oxygen. I am not sure why he was not felt to be hospice eligible which as I understand things that 6 months by reasonable estimate. 6. The daughter was asking me about admission to the hospital. There is nothing about this that looks acute. None of these are infected. I think she is interested in trying to find a different facility. 7. Although I am not exactly sure what they were putting on at the facility I believe it was Medihoney to the foot and Santyl the buttocks I am  reasonably okay with this. 8. We will see him back in 2 weeks. At that point if everything is static I will tried to debride him although this is not going to be easy and I told the patient and his daughter this. We spent 50 minutes in review of this patient's record face-to-face evaluation and preparation of this record Electronic Signature(s) Signed: 07/16/2019 5:33:20 PM By: Linton Ham MD Entered By: Linton Ham on 07/16/2019 13:15:29 -------------------------------------------------------------------------------- HxROS Details Patient Name: Date of Service: Suezanne Jacquet [facility] 07/16/2019 10:30 A M Medical Record Number: [id number] Patient Account Number: [id number] Date of Birth/Sex: Treating RN: August 20, 1940 (78 y.o. [name] Primary Care Provider: Hendricks Limes Other Clinician: Referring Provider: Treating Provider/Extender: Mare Ferrari in Treatment: 0 Information Obtained From Patient Constitutional Symptoms (General Health) Complaints and Symptoms: Negative for: Fatigue; Fever; Chills; Marked Weight Change Eyes Complaints and Symptoms: Negative for: Dry Eyes; Vision Changes; Glasses / Contacts Medical History: Negative for: Cataracts; Glaucoma; Optic Neuritis Ear/Nose/Mouth/Throat Complaints and Symptoms: Negative for: Chronic sinus problems or rhinitis Medical History: Negative for: Chronic sinus problems/congestion; Middle ear problems Respiratory Complaints and Symptoms: Positive for: Chronic or frequent coughs; Shortness of Breath Medical History: Positive for: Chronic Obstructive Pulmonary Disease (COPD) Negative for: Aspiration; Asthma; Pneumothorax; Sleep Apnea; Tuberculosis Cardiovascular Complaints and Symptoms: Negative for: Chest pain Medical History: Positive for: Coronary Artery Disease; Hypertension Negative for: Angina; Arrhythmia; Congestive Heart Failure; Deep Vein Thrombosis; Hypotension; Myocardial Infarction;  Peripheral Arterial Disease; Peripheral Venous Disease; Phlebitis; Vasculitis Gastrointestinal Complaints and Symptoms: Negative for: Frequent diarrhea; Nausea; Vomiting Medical History: Negative for: Cirrhosis ; Colitis; Crohns; Hepatitis A; Hepatitis B; Hepatitis C Endocrine Complaints and Symptoms: Negative for: Heat/cold intolerance Medical History: Negative for: Type I Diabetes; Type II Diabetes Genitourinary Complaints and Symptoms: Negative for: Frequent urination Medical History: Negative for: End Stage Renal Disease Integumentary (Skin) Complaints and Symptoms: Positive for: Wounds Medical History: Negative for: History of Burn Musculoskeletal Complaints and Symptoms: Negative for: Muscle Pain; Muscle Weakness Medical History: Negative for: Gout; Rheumatoid Arthritis; Osteoarthritis; Osteomyelitis Neurologic Complaints and Symptoms: Negative for: Numbness/parasthesias Medical History: Negative for: Dementia; Neuropathy; Quadriplegia; Paraplegia; Seizure Disorder Psychiatric Complaints and Symptoms: Negative for: Claustrophobia; Suicidal Medical History: Negative for: Anorexia/bulimia; Confinement Anxiety Hematologic/Lymphatic Medical History: Negative for: Anemia; Hemophilia; Human Immunodeficiency Virus; Lymphedema; Sickle Cell Disease Immunological Medical History: Negative for: Lupus Erythematosus; Raynauds;  Scleroderma Oncologic Medical History: Negative for: Received Chemotherapy; Received Radiation Immunizations Pneumococcal Vaccine: Received Pneumococcal Vaccination: No Implantable Devices None Family and Social History Cancer: No; Diabetes: Yes - Mother; Heart Disease: Yes - Mother; Hypertension: No; Kidney Disease: No; Lung Disease: No; Seizures: No; Stroke: No; Thyroid Problems: No; Tuberculosis: No; Former smoker; Marital Status - Widowed; Alcohol Use: Rarely; Drug Use: No History; Caffeine Use: Rarely; Financial Concerns: No; Food, Clothing  or Shelter Needs: No; Support System Lacking: No; Transportation Concerns: No Electronic Signature(s) Signed: 07/16/2019 5:02:18 PM By: Yevonne Pax RN Signed: 07/16/2019 5:33:20 PM By: Baltazar Najjar MD Entered By: Yevonne Pax on 07/16/2019 11:40:43 -------------------------------------------------------------------------------- SuperBill Details Patient Name: Date of Service: Paul Suarez [facility] 07/16/2019 Medical Record Number: [id number] Patient Account Number: [id number] Date of Birth/Sex: Treating RN: November 19, 1940 (79 y.o. 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, unstageable L89.610 Pressure ulcer of right heel, unstageable L97.518 Non-pressure chronic ulcer of other part of right foot with other specified severity L89.210 Pressure ulcer of right hip, unstageable L89.310 Pressure ulcer of right buttock, unstageable L89.320 Pressure ulcer of left buttock, unstageable Facility Procedures Physician Procedures : CPT4 Code Description Modifier [id number] - WC PHYS LEVEL 4 - NEW PT ICD-10 Diagnosis Description 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

## 2019-07-16 NOTE — Progress Notes (Signed)
ARMONIE, STATEN ([id number]) Visit Report for 07/16/2019 Abuse/Suicide Risk Screen Details Patient Name: Date of Service: Paul Suarez [facility] 07/16/2019 10:30 A M Medical Record Number: [id number] Patient Account Number: [id number] Date of Birth/Sex: Treating RN: [birth year] (78 y.o. Judie Petit) Yevonne Pax Primary Care Evonne Rinks: Pecola Lawless Other Clinician: Referring Djon Tith: Treating Deetya Drouillard/Extender: Alverda Skeans Weeks in Treatment: 0 Abuse/Suicide Risk Screen Items Answer ABUSE RISK SCREEN: Has anyone close to you tried to hurt or harm you recentlyo No Do you feel uncomfortable with anyone in your familyo No Has anyone forced you do things that you didnt want to doo No Electronic Signature(s) Signed: 07/16/2019 5:02:18 PM By: Yevonne Pax RN Entered By: Yevonne Pax on 07/16/2019 11:41:15 -------------------------------------------------------------------------------- Activities of Daily Living Details Patient Name: Date of Service: Paul Suarez [facility] 07/16/2019 10:30 A M Medical Record Number: [id number] Patient Account Number: [id number] Date of Birth/Sex: Treating RN: 1940/05/09 (78 y.o. Judie Petit) Yevonne Pax Primary Care Odus Clasby: Pecola Lawless Other Clinician: Referring Andretta Ergle: Treating Howard Bunte/Extender: Alverda Skeans Weeks in Treatment: 0 Activities of Daily Living Items Answer Activities of Daily Living (Please select one for each item) Drive Automobile Completely Able T Medications ake Need Assistance Use T elephone Need Assistance Care for Appearance Need Assistance Use T oilet Need Assistance Bath / Shower Not Able Dress Self Not Able Feed Self Completely Able Walk Not Able Get In / Out Bed Not Able Housework Not Able Prepare Meals Not Able Handle Money Not Able Shop for Self Not Able Electronic Signature(s) Signed: 07/16/2019 5:02:18 PM By: Yevonne Pax RN Entered By: Yevonne Pax on 07/16/2019  11:42:02 -------------------------------------------------------------------------------- Education Screening Details Patient Name: Date of Service: Paul Suarez [facility] 07/16/2019 10:30 A M Medical Record Number: [id number] Patient Account Number: [id number] Date of Birth/Sex: Treating RN: [birth year] (78 y.o. Judie Petit) Yevonne Pax Primary Care [place] Name: Pecola Lawless Other Clinician: Referring Makennah Omura: Treating Denine Brotz/Extender: Roselie Awkward in Treatment: 0 Primary Learner Assessed: Patient Learning Preferences/Education Level/Primary Language Learning Preference: Explanation Highest Education Level: High School Preferred Language: English Cognitive Barrier Language Barrier: No Translator Needed: No Memory Deficit: No Emotional Barrier: No Cultural/Religious Beliefs Affecting Medical Care: No Physical Barrier Impaired Vision: No Impaired Hearing: No Decreased Hand dexterity: No Knowledge/Comprehension Knowledge Level: Medium Comprehension Level: High Ability to understand written instructions: High Ability to understand verbal instructions: High Motivation Anxiety Level: Calm Cooperation: Cooperative Education Importance: Acknowledges Need Interest in Health Problems: Asks Questions Perception: Coherent Willingness to Engage in Self-Management High Activities: Readiness to Engage in Self-Management High Activities: Electronic Signature(s) Signed: 07/16/2019 5:02:18 PM By: Yevonne Pax RN Entered By: Yevonne Pax on 07/16/2019 11:42:41 -------------------------------------------------------------------------------- Fall Risk Assessment Details Patient Name: Date of Service: Paul Suarez [facility] 07/16/2019 10:30 A M Medical Record Number: [id number] Patient Account Number: [id number] Date of Birth/Sex: Treating RN: 1940/05/16 (78 y.o. Judie Petit) Yevonne Pax Primary Care Kalianna Verbeke: Pecola Lawless Other Clinician: Referring Jeannemarie Sawaya: Treating  Uilani Sanville/Extender: Alverda Skeans Weeks in Treatment: 0 Fall Risk Assessment Items Have you had 2 or more falls in the last 12 monthso 0 Yes Have you had any fall that resulted in injury in the last 12 monthso 0 Yes FALLS RISK SCREEN History of falling - immediate or within 3 months 25 Yes Secondary diagnosis (Do you have 2 or more medical diagnoseso) 0 No Ambulatory aid None/bed rest/wheelchair/nurse 0 No Crutches/cane/walker 0 No Furniture 0 No Intravenous therapy Access/Saline/Heparin Lock 0 No Gait/Transferring Normal/  bed rest/ wheelchair 0 No Weak (short steps with or without shuffle, stooped but able to lift head while walking, may seek 0 No support from furniture) Impaired (short steps with shuffle, may have difficulty arising from chair, head down, impaired 0 No balance) Mental Status Oriented to own ability 0 No Electronic Signature(s) Signed: 07/16/2019 5:02:18 PM By: Carlene Coria RN Entered By: Carlene Coria on 07/16/2019 11:42:57 -------------------------------------------------------------------------------- Foot Assessment Details Patient Name: Date of Service: Paul Suarez [facility] 07/16/2019 10:30 A M Medical Record Number: [id number] Patient Account Number: [id number] Date of Birth/Sex: Treating RN: 04/17/1940 (78 y.o. [name]) Carlene Coria Primary Care Kerin Kren: Hendricks Limes Other Clinician: Referring Jaamal Farooqui: Treating Cody Oliger/Extender: Malachy Moan Weeks in Treatment: 0 Foot Assessment Items Site Locations + = Sensation present, - = Sensation absent, C = Callus, U = Ulcer R = Redness, W = Warmth, M = Maceration, PU = Pre-ulcerative lesion F = Fissure, S = Swelling, D = Dryness Assessment Right: Left: Other Deformity: No No Prior Foot Ulcer: No No Prior Amputation: No No Charcot Joint: No No Ambulatory Status: Non-ambulatory Assistance Device: Wheelchair Gait: Electronic Signature(s) Signed: 07/16/2019 5:02:18 PM By:  Carlene Coria RN Entered By: Carlene Coria on 07/16/2019 11:52:10 -------------------------------------------------------------------------------- Nutrition Risk Screening Details Patient Name: Date of Service: Paul Suarez [facility] 07/16/2019 10:30 A M Medical Record Number: [id number] Patient Account Number: [id number] Date of Birth/Sex: Treating RN: Feb 06, 1941 (78 y.o. [name]) Carlene Coria Primary Care Anjelita Sheahan: Hendricks Limes Other Clinician: Referring Gayle Martinez: Treating Mallory Enriques/Extender: Malachy Moan Weeks in Treatment: 0 Height (in): 71 Weight (lbs): 126 Body Mass Index (BMI): 17.6 Nutrition Risk Screening Items Score Screening NUTRITION RISK SCREEN: I have an illness or condition that made me change the kind and/or amount of food I eat 0 No I eat fewer than two meals per day 0 No I eat few fruits and vegetables, or milk products 0 No I have three or more drinks of beer, liquor or wine almost every day 0 No I have tooth or mouth problems that make it hard for me to eat 0 No I don't always have enough money to buy the food I need 0 No I eat alone most of the time 0 No I take three or more different prescribed or over-the-counter drugs a day 1 Yes Without wanting to, I have lost or gained 10 pounds in the last six months 2 Yes I am not always physically able to shop, cook and/or feed myself 2 Yes Nutrition Protocols Good Risk Protocol Moderate Risk Protocol High Risk Proctocol 0 Provide education on nutrition Risk Level: Moderate Risk Score: 5 Electronic Signature(s) Signed: 07/16/2019 5:02:18 PM By: Carlene Coria RN Entered By: Carlene Coria on 07/16/2019 11:43:22

## 2019-07-16 NOTE — Progress Notes (Signed)
OGDEN, HANDLIN ([id number]) Visit Report for 07/16/2019 Allergy List Details Patient Name: Date of Service: [facility] 07/16/2019 10:30 A M Medical Record Number: [id number] Patient Account Number: [id number] Date of Birth/Sex: Treating RN: October 01, 1940 (78 y.o. Paul Suarez) Carlene Coria Primary Care Jashua Knaak: Hendricks Limes Other Clinician: Referring Madyx Delfin: Treating Yaritsa Savarino/Extender: Malachy Moan Weeks in Treatment: 0 Allergies Active Allergies No Known Allergies Allergy Notes Electronic Signature(s) Signed: 07/16/2019 5:02:18 PM By: Carlene Coria RN Entered By: Carlene Coria on 07/16/2019 11:33:00 -------------------------------------------------------------------------------- Arrival Information Details Patient Name: Date of Service: Paul Jacquet [facility] 07/16/2019 10:30 A M Medical Record Number: [id number] Patient Account Number: [id number] Date of Birth/Sex: Treating RN: [birth year] (78 y.o. Paul Suarez Primary Care Zully Frane: Hendricks Limes Other Clinician: Referring Anibal Quinby: Treating Key Cen/Extender: Mare Ferrari in Treatment: 0 Visit Information Patient Arrived: Wheel Chair Arrival Time: 11:19 Accompanied By: daughter Transfer Assistance: Hoyer Lift Patient Identification Verified: Yes Secondary Verification Process Completed: Yes Patient Requires Transmission-Based Precautions: No Patient Has Alerts: No Electronic Signature(s) Signed: 07/16/2019 5:02:18 PM By: Carlene Coria RN Entered By: Carlene Coria on 07/16/2019 11:31:43 -------------------------------------------------------------------------------- Clinic Level of Care Assessment Details Patient Name: Date of Service: Paul Jacquet [facility] 07/16/2019 10:30 A M Medical Record Number: [id number] Patient Account Number: [id number] Date of Birth/Sex: Treating RN: January 15, 1941 (79 y.o. Paul Suarez Primary Care Stephene Alegria: Hendricks Limes Other Clinician: Referring Blimy Napoleon: Treating  Khali Perella/Extender: Mare Ferrari in Treatment: 0 Clinic Level of Care Assessment Items TOOL 2 Quantity Score X- 1 0 Use when only an EandM is performed on the INITIAL visit ASSESSMENTS - Nursing Assessment / Reassessment X- 1 20 General Physical Exam (combine w/ comprehensive assessment (listed just below) when performed on new pt. evals) X- 1 25 Comprehensive Assessment (HX, ROS, Risk Assessments, Wounds Hx, etc.) ASSESSMENTS - Wound and Skin A ssessment / Reassessment []  - 0 Simple Wound Assessment / Reassessment - one wound X- 8 5 Complex Wound Assessment / Reassessment - multiple wounds []  - 0 Dermatologic / Skin Assessment (not related to wound area) ASSESSMENTS - Ostomy and/or Continence Assessment and Care []  - 0 Incontinence Assessment and Management []  - 0 Ostomy Care Assessment and Management (repouching, etc.) PROCESS - Coordination of Care X - Simple Patient / Family Education for ongoing care 1 15 []  - 0 Complex (extensive) Patient / Family Education for ongoing care X- 1 10 Staff obtains Programmer, systems, Records, T Results / Process Orders est X- 1 10 Staff telephones HHA, Nursing Homes / Clarify orders / etc []  - 0 Routine Transfer to another Facility (non-emergent condition) []  - 0 Routine Hospital Admission (non-emergent condition) X- 1 15 New Admissions / Biomedical engineer / Ordering NPWT Apligraf, etc. , []  - 0 Emergency Hospital Admission (emergent condition) X- 1 10 Simple Discharge Coordination []  - 0 Complex (extensive) Discharge Coordination PROCESS - Special Needs []  - 0 Pediatric / Minor Patient Management []  - 0 Isolation Patient Management []  - 0 Hearing / Language / Visual special needs []  - 0 Assessment of Community assistance (transportation, D/C planning, etc.) []  - 0 Additional assistance / Altered mentation []  - 0 Support Surface(s) Assessment (bed, cushion, seat, etc.) INTERVENTIONS - Wound  Cleansing / Measurement X- 1 5 Wound Imaging (photographs - any number of wounds) []  - 0 Wound Tracing (instead of photographs) []  - 0 Simple Wound Measurement - one wound X- 8 5 Complex Wound Measurement - multiple wounds []  -  0 Simple Wound Cleansing - one wound X- 8 5 Complex Wound Cleansing - multiple wounds INTERVENTIONS - Wound Dressings X - Small Wound Dressing one or multiple wounds 8 10 []  - 0 Medium Wound Dressing one or multiple wounds []  - 0 Large Wound Dressing one or multiple wounds []  - 0 Application of Medications - injection INTERVENTIONS - Miscellaneous []  - 0 External ear exam []  - 0 Specimen Collection (cultures, biopsies, blood, body fluids, etc.) []  - 0 Specimen(s) / Culture(s) sent or taken to Lab for analysis X- 1 10 Patient Transfer (multiple staff / Michiel SitesHoyer Lift / Similar devices) []  - 0 Simple Staple / Suture removal (25 or less) []  - 0 Complex Staple / Suture removal (26 or more) []  - 0 Hypo / Hyperglycemic Management (close monitor of Blood Glucose) X- 1 15 Ankle / Brachial Index (ABI) - do not check if billed separately Has the patient been seen at the hospital within the last three years: Yes Total Score: 335 Level Of Care: New/Established - Level 5 Electronic Signature(s) Signed: 07/16/2019 5:27:06 PM By: Zandra AbtsLynch, Shatara RN, BSN Entered By: Zandra AbtsLynch, Shatara on 07/16/2019 14:53:29 -------------------------------------------------------------------------------- Encounter Discharge Information Details Patient Name: Date of Service[name] 10:30 A M Medical Record Number: [id number] Patient Account Number: [id number] Date of Birth/Sex: Treating RN: 03-12-1941 (79 y.o. Paul Suarez) Dwiggins, Shannon Primary Care Ziad Maye: Pecola LawlessHopper, William F Other Clinician: Referring Tishawn Friedhoff: Treating Rayce Brahmbhatt/Extender: Roselie Awkwardobson, Michael Hopper, William F Weeks in Treatment: 0 Encounter Discharge Information Items Discharge Condition: Stable Ambulatory  Status: Ambulatory Discharge Destination: Skilled Nursing Facility Telephoned: No Orders Sent: Yes Transportation: Other Accompanied By: daughter Schedule Follow-up Appointment: Yes Clinical Summary of Care: Patient Declined Electronic Signature(s) Signed: 07/16/2019 4:55:42 PM By: Cherylin Mylarwiggins, Shannon Entered By: Cherylin Mylarwiggins, Shannon on 07/16/2019 13:30:40 -------------------------------------------------------------------------------- Lower Extremity Assessment Details Patient Name: Date of Service: [name] 10:30 A M Medical Record Number: [id number] Patient Account Number: [id number] Date of Birth/Sex: Treating RN: 03-12-1941 (78 y.o. Judie PetitM) Yevonne PaxEpps, Carrie Primary Care Beautifull Cisar: Pecola LawlessHopper, William F Other Clinician: Referring Ameliya Nicotra: Treating Chaquetta Schlottman/Extender: Alverda Skeansobson, Michael Hopper, William F Weeks in Treatment: 0 Edema Assessment Assessed: Kyra Searles[Left: No] [Right: No] E[Left: dema] [Right: :] Calf Left: Right: Point of Measurement: 38 cm From Medial Instep cm 34 cm Ankle Left: Right: Point of Measurement: 11 cm From Medial Instep cm 24 cm Vascular Assessment Blood Pressure: Brachial: [Right:147] Ankle: [Right:Dorsalis Pedis: 110 0.75] Electronic Signature(s) Signed: 07/16/2019 5:02:18 PM By: Yevonne PaxEpps, Carrie RN Entered By: Yevonne PaxEpps, Carrie on 07/16/2019 12:10:38 -------------------------------------------------------------------------------- Multi Wound Chart Details Patient Name: Date of Service: [name] 07/16/2019 10:30 A M Medical Record Number: [id number] Patient Account Number: [id number] Date of Birth/Sex: Treating RN: 03-12-1941 (79 y.o. Elizebeth KollerM) Lynch, Shatara Primary Care Felix Meras: Pecola LawlessHopper, William F Other Clinician: Referring Lada Fulbright: Treating Yu Cragun/Extender: Roselie Awkwardobson, Michael Hopper, William F Weeks in Treatment: 0 Vital Signs Height(in): 71 Pulse(bpm): 107 Weight(lbs): 126 Blood Pressure(mmHg): 147/75 Body Mass Index(BMI): 18 Temperature(F):  98.4 Respiratory Rate(breaths/min): 22 Photos: [1:No Photos Right, Lateral Ankle] [2:No Photos Right Calcaneus] [3:No Photos Right, Lateral Foot] Wound Location: [1:Gradually Appeared] [2:Gradually Appeared] [3:Gradually Appeared] Wounding Event: [1:Pressure Ulcer] [2:Pressure Ulcer] [3:Pressure Ulcer] Primary Etiology: [1:Chronic Obstructive Pulmonary] [2:Chronic Obstructive Pulmonary] [3:Chronic Obstructive Pulmonary] Comorbid History: [1:Disease (COPD), Coronary Artery Disease, Hypertension 06/14/2019] [2:Disease (COPD), Coronary Artery Disease, Hypertension 06/14/2019] [3:Disease (COPD), Coronary Artery Disease, Hypertension 06/14/2019] Date Acquired: [1:0] [2:0] [3:0] Weeks of Treatment: [1:Open] [2:Open] [3:Open] Wound Status: [1:2x1.5x0.1] [2:2x2x0.2] [3:2x2x0.1] Measurements L x W x D (  cm) [1:2.356] [2:3.142] [3:3.142] A (cm) : rea [1:0.236] [2:0.628] [3:0.314] Volume (cm) : [1:N/A] [2:N/A] [3:N/A] % Reduction in A rea: [1:N/A] [2:N/A] [3:N/A] % Reduction in Volume: [1:Unstageable/Unclassified] [2:Unstageable/Unclassified] [3:Unstageable/Unclassified] Classification: [1:Small] [2:Small] [3:Medium] Exudate A mount: [1:Serosanguineous] [2:Serosanguineous] [3:Serosanguineous] Exudate Type: [1:red, brown] [2:red, brown] [3:red, brown] Exudate Color: [1:None Present (0%)] [2:None Present (0%)] [3:Medium (34-66%)] Granulation A mount: [1:N/A] [2:N/A] [3:Pink] Granulation Quality: [1:Large (67-100%)] [2:Large (67-100%)] [3:Medium (34-66%)] Necrotic A mount: [1:Eschar] [2:Eschar] [3:Adherent Slough] Necrotic Tissue: [1:Fascia: No] [2:Fat Layer (Subcutaneous Tissue)] [3:Fat Layer (Subcutaneous Tissue)] Exposed Structures: [1:Fat Layer (Subcutaneous Tissue) Exposed: No Tendon: No Muscle: No Joint: No Bone: No None] [2:Exposed: Yes Fascia: No Tendon: No Muscle: No Joint: No Bone: No None] [3:Exposed: Yes Fascia: No Tendon: No Muscle: No Joint: No Bone: No Large (67-100%)] Wound Number: Photos: No Photos No Photos No Photos Left Upper Arm Right Trochanter Right Ischium Wound Location: Gradually Appeared Gradually Appeared Gradually Appeared Wounding Event: Skin T ear Pressure Ulcer Pressure Ulcer Primary Etiology: Chronic Obstructive Pulmonary Chronic Obstructive Pulmonary Chronic Obstructive Pulmonary Comorbid History: Disease (COPD), Coronary Artery Disease (COPD), Coronary Artery Disease (COPD), Coronary Artery Disease, Hypertension Disease, Hypertension Disease, Hypertension 06/27/2019 06/14/2019 06/27/2019 Date Acquired: 0 0 0 Weeks of Treatment: Open Open Open Wound Status: 3.5x0.2x0.1 5.5x2x0.1 4.5x4.5x0.1 Measurements L x W x D (cm) 0.55 8.639 15.904 A (cm) : rea 0.055 0.864 1.59 Volume (cm) : N/A N/A 0.00% % Reduction in Area: N/A N/A 0.00% % Reduction in Volume: Full Thickness Without Exposed Category/Stage III Unstageable/Unclassified Classification: Support Structures Small Medium Medium Exudate A mount: Serosanguineous Serosanguineous Serosanguineous Exudate Type: red, brown red, brown red, brown Exudate Color: Medium (34-66%) Medium (34-66%) Medium (34-66%) Granulation Amount: Red Red Pink Granulation Quality: N/A Medium (34-66%) Medium (34-66%) Necrotic Amount: N/A Adherent Slough Adherent Slough Necrotic Tissue: Fat Layer (Subcutaneous Tissue) Fat Layer (Subcutaneous Tissue) Fat Layer (Subcutaneous Tissue) Exposed Structures: Exposed: Yes Exposed: Yes Exposed: Yes Fascia: No Fascia: No Tendon: No Tendon: No Muscle: No Muscle: No Joint: No Joint: No Bone: No Bone: No None None None Epithelialization: Wound Number: 7 8 N/A Photos: No Photos No Photos N/A Right Gluteus Left Ischium N/A Wound Location: Gradually Appeared Gradually Appeared N/A Wounding Event: Pressure Ulcer Pressure Ulcer N/A Primary Etiology: Chronic Obstructive Pulmonary Chronic Obstructive Pulmonary N/A Comorbid History: Disease (COPD), Coronary  Artery Disease (COPD), Coronary Artery Disease, Hypertension Disease, Hypertension 07/03/2019 06/27/2019 N/A Date Acquired: 0 0 N/A Weeks of Treatment: Open Open N/A Wound Status: 6x4x0.1 5x2x0.1 N/A Measurements L x W x D (cm) 18.85 7.854 N/A A (cm) : rea 1.885 0.785 N/A Volume (cm) : N/A 0.00% N/A % Reduction in A rea: N/A 0.00% N/A % Reduction in Volume: Category/Stage III Unstageable/Unclassified N/A Classification: Medium Medium N/A Exudate A mount: Serosanguineous Serosanguineous N/A Exudate Type: red, brown red, brown N/A Exudate Color: Medium (34-66%) Medium (34-66%) N/A Granulation A mount: Pink Pink N/A Granulation Quality: Medium (34-66%) Medium (34-66%) N/A Necrotic A mount: Eschar, Adherent Slough Eschar, Adherent Slough N/A Necrotic Tissue: N/A Fascia: No N/A Exposed Structures: Fat Layer (Subcutaneous Tissue) Exposed: No Tendon: No Muscle: No Joint: No Bone: No None None N/A Epithelialization: Treatment Notes Electronic Signature(s) Signed: 07/16/2019 5:27:06 PM By: Zandra Abts RN, BSN Signed: 07/16/2019 5:33:20 PM By: Baltazar Najjar MD Entered By: Baltazar Najjar on 07/16/2019 13:02:22 -------------------------------------------------------------------------------- Multi-Disciplinary Care Plan Details Patient Name: Date of Service: Dola Factor [facility] 07/16/2019 10:30 A M Medical Record Number: [id number] Patient Account Number: [id number] Date of  Birth/Sex: Treating RN: 10-May-1940 (79 y.o. [name] Primary Care Mertha Clyatt: Pecola Lawless Other Clinician: Referring Wayman Hoard: Treating Daryon Remmert/Extender: Roselie Awkward in Treatment: 0 Active Inactive Abuse / Safety / Falls / Self Care Management Nursing Diagnoses: Potential for falls Potential for injury related to falls Goals: Patient will remain injury free related to falls Date Initiated: 07/16/2019 Target Resolution Date: 08/17/2019 Goal Status:  Active Patient/caregiver will verbalize/demonstrate measures taken to prevent injury and/or falls Date Initiated: 07/16/2019 Target Resolution Date: 08/17/2019 Goal Status: Active Interventions: Assess Activities of Daily Living upon admission and as needed Assess fall risk on admission and as needed Assess: immobility, friction, shearing, incontinence upon admission and as needed Assess impairment of mobility on admission and as needed per policy Assess personal safety and home safety (as indicated) on admission and as needed Assess self care needs on admission and as needed Provide education on fall prevention Provide education on personal and home safety Notes: Nutrition Nursing Diagnoses: Potential for alteratiion in Nutrition/Potential for imbalanced nutrition Goals: Patient/caregiver agrees to and verbalizes understanding of need to use nutritional supplements and/or vitamins as prescribed Date Initiated: 07/16/2019 Target Resolution Date: 08/17/2019 Goal Status: Active Interventions: Assess patient nutrition upon admission and as needed per policy Provide education on nutrition Notes: Pressure Nursing Diagnoses: Knowledge deficit related to causes and risk factors for pressure ulcer development Knowledge deficit related to management of pressures ulcers Potential for impaired tissue integrity related to pressure, friction, moisture, and shear Goals: Patient/caregiver will verbalize risk factors for pressure ulcer development Date Initiated: 07/16/2019 Target Resolution Date: 08/17/2019 Goal Status: Active Patient/caregiver will verbalize understanding of pressure ulcer management Date Initiated: 07/16/2019 Target Resolution Date: 08/17/2019 Goal Status: Active Interventions: Assess: immobility, friction, shearing, incontinence upon admission and as needed Assess offloading mechanisms upon admission and as needed Assess potential for pressure ulcer upon admission and as  needed Notes: Wound/Skin Impairment Nursing Diagnoses: Impaired tissue integrity Knowledge deficit related to ulceration/compromised skin integrity Goals: Patient/caregiver will verbalize understanding of skin care regimen Date Initiated: 07/16/2019 Target Resolution Date: 08/17/2019 Goal Status: Active Interventions: Assess patient/caregiver ability to obtain necessary supplies Assess patient/caregiver ability to perform ulcer/skin care regimen upon admission and as needed Assess ulceration(s) every visit Provide education on ulcer and skin care Notes: Electronic Signature(s) Signed: 07/16/2019 5:27:06 PM By: Zandra Abts RN, BSN Entered By: Zandra Abts on 07/16/2019 12:47:30 -------------------------------------------------------------------------------- Pain Assessment Details Patient Name: Date of Service: Dola Factor [facility] 07/16/2019 10:30 A M Medical Record Number: [id number] Patient Account Number: [id number] Date of Birth/Sex: Treating RN: [birth year] (78 y.o. Melonie [place] Primary Care Jaylani Mcguinn: Pecola Lawless Other Clinician: Referring Efraim Vanallen: Treating Mikyah Alamo/Extender: Alverda Skeans Weeks in Treatment: 0 Active Problems Location of Pain Severity and Description of Pain Patient Has Paino No Site Locations With Dressing Change: Yes Duration of the Pain. Constant / Intermittento Constant Rate the pain. Current Pain Level: 3 Worst Pain Level: 6 Least Pain Level: 1 Tolerable Pain Level: 5 Character of Pain Describe the Pain: Aching Pain Management and Medication Current Pain Management: Medication: Yes Cold Application: No Rest: Yes Massage: No Activity: No T.E.N.S.: No Heat Application: No Leg drop or elevation: No Is the Current Pain Management Adequate: Inadequate How does your wound impact your activities of daily livingo Sleep: Yes Bathing: No Appetite: No Relationship With Others: No Bladder Continence: No Emotions:  No Bowel Continence: No Work: No Toileting: No Drive: No Dressing: No Hobbies: No Electronic Signature(s) Signed: 07/16/2019 5:02:18  PM By: Yevonne Pax RN Entered By: Yevonne Pax on 07/16/2019 12:24:42 -------------------------------------------------------------------------------- Patient/Caregiver Education Details Patient Name: Date of Service: Dola Factor [facility] 5/3/2021andnbsp10:30 A M Medical Record Number: [id number] Patient Account Number: [id number] Date of Birth/Gender: Treating RN: 1940-03-18 (79 y.o. [name] Primary Care Physician: Pecola Lawless Other Clinician: Referring Physician: Treating Physician/Extender: Roselie Awkward in Treatment: 0 Education Assessment Education Provided To: Patient Education Topics Provided Nutrition: Methods: Explain/Verbal Responses: State content correctly Safety: Methods: Explain/Verbal Responses: State content correctly Wound/Skin Impairment: Methods: Explain/Verbal Responses: State content correctly Electronic Signature(s) Signed: 07/16/2019 5:27:06 PM By: Zandra Abts RN, BSN Entered By: Zandra Abts on 07/16/2019 12:55:32 -------------------------------------------------------------------------------- Wound Assessment Details Patient Name: Date of Service: Dola Factor [facility] 07/16/2019 10:30 A M Medical Record Number: [id number] Patient Account Number: [id number] Date of Birth/Sex: Treating RN: March 15, 1941 (78 y.o. Judie Petit) Yevonne Pax Primary Care Signora Zucco: Pecola Lawless Other Clinician: Referring Ayrianna Mcginniss: Treating Tynisha Ogan/Extender: Alverda Skeans Weeks in Treatment: 0 Wound Status Wound Number: 1 Primary Pressure Ulcer Etiology: Wound Location: Right, Lateral Ankle Wound Open Wounding Event: Gradually Appeared Status: Date Acquired: 06/14/2019 Comorbid Chronic Obstructive Pulmonary Disease (COPD), Coronary Weeks Of Treatment: 0 History: Artery Disease,  Hypertension Clustered Wound: No Wound Measurements Length: (cm) 2 Width: (cm) 1.5 Depth: (cm) 0.1 Area: (cm) 2.356 Volume: (cm) 0.236 % Reduction in Area: % Reduction in Volume: Epithelialization: None Tunneling: No Undermining: No Wound Description Classification: Unstageable/Unclassified Exudate Amount: Small Exudate Type: Serosanguineous Exudate Color: red, brown Foul Odor After Cleansing: No Slough/Fibrino No Wound Bed Granulation Amount: None Present (0%) Exposed Structure Necrotic Amount: Large (67-100%) Fascia Exposed: No Necrotic Quality: Eschar Fat Layer (Subcutaneous Tissue) Exposed: No Tendon Exposed: No Muscle Exposed: No Joint Exposed: No Bone Exposed: No Treatment Notes Wound #1 (Right, Lateral Ankle) 1. Cleanse With Wound Cleanser 3. Primary Dressing Applied Other primary dressing (specifiy in notes) 4. Secondary Dressing Dry Gauze Roll Gauze Heel Cup Notes medihoney Electronic Signature(s) Signed: 07/16/2019 5:02:18 PM By: Yevonne Pax RN Entered By: Yevonne Pax on 07/16/2019 12:12:24 -------------------------------------------------------------------------------- Wound Assessment Details Patient Name: Date of Service: Dola Factor [facility] 07/16/2019 10:30 A M Medical Record Number: [id number] Patient Account Number: [id number] Date of Birth/Sex: Treating RN: 1940/11/18 (78 y.o. Judie Petit) Yevonne Pax Primary Care Nicola Heinemann: Pecola Lawless Other Clinician: Referring Sukhdeep Wieting: Treating Azarias Chiou/Extender: Alverda Skeans Weeks in Treatment: 0 Wound Status Wound Number: 2 Primary Pressure Ulcer Etiology: Wound Location: Right Calcaneus Wound Open Wounding Event: Gradually Appeared Status: Date Acquired: 06/14/2019 Comorbid Chronic Obstructive Pulmonary Disease (COPD), Coronary Weeks Of Treatment: 0 History: Artery Disease, Hypertension Clustered Wound: No Wound Measurements Length: (cm) 2 Width: (cm) 2 Depth: (cm) 0.2 Area:  (cm) 3.142 Volume: (cm) 0.628 % Reduction in Area: % Reduction in Volume: Epithelialization: None Tunneling: No Undermining: No Wound Description Classification: Unstageable/Unclassified Exudate Amount: Small Exudate Type: Serosanguineous Exudate Color: red, brown Foul Odor After Cleansing: No Slough/Fibrino No Wound Bed Granulation Amount: None Present (0%) Exposed Structure Necrotic Amount: Large (67-100%) Fascia Exposed: No Necrotic Quality: Eschar Fat Layer (Subcutaneous Tissue) Exposed: Yes Tendon Exposed: No Muscle Exposed: No Joint Exposed: No Bone Exposed: No Treatment Notes Wound #2 (Right Calcaneus) 1. Cleanse With Wound Cleanser 3. Primary Dressing Applied Other primary dressing (specifiy in notes) 4. Secondary Dressing Dry Gauze Roll Gauze Heel Cup Notes medihoney Electronic Signature(s) Signed: 07/16/2019 5:02:18 PM By: Yevonne Pax RN Entered By: Yevonne Pax on 07/16/2019 12:13:42 -------------------------------------------------------------------------------- Wound Assessment Details  Patient Name: Date of Service: Dola Factor [facility] 07/16/2019 10:30 A M Medical Record Number: [id number] Patient Account Number: [id number] Date of Birth/Sex: Treating RN: 10-24-1940 (78 y.o. Judie Petit) Yevonne Pax Primary Care Mohammedali Bedoy: Pecola Lawless Other Clinician: Referring Fusaye Wachtel: Treating Aerabella Galasso/Extender: Alverda Skeans Weeks in Treatment: 0 Wound Status Wound Number: 3 Primary Pressure Ulcer Etiology: Wound Location: Right, Lateral Foot Wound Open Wounding Event: Gradually Appeared Status: Date Acquired: 06/14/2019 Comorbid Chronic Obstructive Pulmonary Disease (COPD), Coronary Weeks Of Treatment: 0 History: Artery Disease, Hypertension Clustered Wound: No Wound Measurements Length: (cm) 2 Width: (cm) 2 Depth: (cm) 0.1 Area: (cm) 3.142 Volume: (cm) 0.314 % Reduction in Area: % Reduction in Volume: Epithelialization: Large  (67-100%) Tunneling: No Undermining: No Wound Description Classification: Unstageable/Unclassified Exudate Amount: Medium Exudate Type: Serosanguineous Exudate Color: red, brown Foul Odor After Cleansing: No Slough/Fibrino Yes Wound Bed Granulation Amount: Medium (34-66%) Exposed Structure Granulation Quality: Pink Fascia Exposed: No Necrotic Amount: Medium (34-66%) Fat Layer (Subcutaneous Tissue) Exposed: Yes Necrotic Quality: Adherent Slough Tendon Exposed: No Muscle Exposed: No Joint Exposed: No Bone Exposed: No Treatment Notes Wound #3 (Right, Lateral Foot) 1. Cleanse With Wound Cleanser 3. Primary Dressing Applied Other primary dressing (specifiy in notes) 4. Secondary Dressing Dry Gauze Roll Gauze Heel Cup Notes medihoney Electronic Signature(s) Signed: 07/16/2019 5:02:18 PM By: Yevonne Pax RN Entered By: Yevonne Pax on 07/16/2019 12:16:48 -------------------------------------------------------------------------------- Wound Assessment Details Patient Name: Date of Service: Dola Factor [facility] 07/16/2019 10:30 A M Medical Record Number: [id number] Patient Account Number: [id number] Date of Birth/Sex: Treating RN: [birth year] (78 y.o. Judie Petit) Yevonne Pax Primary Care Clive Parcel: Pecola Lawless Other Clinician: Referring Anas Reister: Treating Darrel Baroni/Extender: Alverda Skeans Weeks in Treatment: 0 Wound Status Wound Number: 4 Primary Skin Tear Etiology: Wound Location: Left Upper Arm Wound Open Wounding Event: Gradually Appeared Status: Date Acquired: 06/27/2019 Comorbid Chronic Obstructive Pulmonary Disease (COPD), Coronary Weeks Of Treatment: 0 History: Artery Disease, Hypertension Clustered Wound: No Wound Measurements Length: (cm) 3.5 Width: (cm) 0.2 Depth: (cm) 0.1 Area: (cm) 0.55 Volume: (cm) 0.055 Wound Description Classification: Full Thickness Without Exposed Support Structures Exudate Amount: Small Exudate Type:  Serosanguineous Exudate Color: red, brown Foul Odor After Cleansing: N Slough/Fibrino N % Reduction in Area: % Reduction in Volume: Epithelialization: None Tunneling: No Undermining: No o o Wound Bed Granulation Amount: Medium (34-66%) Exposed Structure Granulation Quality: Red Fascia Exposed: No Fat Layer (Subcutaneous Tissue) Exposed: Yes Tendon Exposed: No Muscle Exposed: No Joint Exposed: No Bone Exposed: No Treatment Notes Wound #4 (Left Upper Arm) 1. Cleanse With Wound Cleanser 3. Primary Dressing Applied Xeroform Gauze 4. Secondary Dressing Foam Border Dressing Electronic Signature(s) Signed: 07/16/2019 5:02:18 PM By: Yevonne Pax RN Entered By: Yevonne Pax on 07/16/2019 12:19:05 -------------------------------------------------------------------------------- Wound Assessment Details Patient Name: Date of Service: Dola Factor [facility] 07/16/2019 10:30 A M Medical Record Number: [id number] Patient Account Number: [id number] Date of Birth/Sex: Treating RN: Jan 17, 1941 (78 y.o. Judie Petit) Yevonne Pax Primary Care Kashari Chalmers: Pecola Lawless Other Clinician: Referring Armando Bukhari: Treating Reneshia Zuccaro/Extender: Alverda Skeans Weeks in Treatment: 0 Wound Status Wound Number: 5 Primary Pressure Ulcer Etiology: Wound Location: Right Trochanter Wound Open Wounding Event: Gradually Appeared Status: Date Acquired: 06/14/2019 Comorbid Chronic Obstructive Pulmonary Disease (COPD), Coronary Weeks Of Treatment: 0 History: Artery Disease, Hypertension Clustered Wound: No Wound Measurements Length: (cm) 5.5 Width: (cm) 2 Depth: (cm) 0.1 Area: (cm) 8.639 Volume: (cm) 0.864 % Reduction in Area: 0% % Reduction in Volume: 0% Epithelialization:  None Tunneling: No Undermining: No Wound Description Classification: Category/Stage III Exudate Amount: Medium Exudate Type: Serosanguineous Exudate Color: red, brown Foul Odor After Cleansing: No Slough/Fibrino  Yes Wound Bed Granulation Amount: Small (1-33%) Exposed Structure Granulation Quality: Red Fascia Exposed: No Necrotic Amount: Large (67-100%) Fat Layer (Subcutaneous Tissue) Exposed: Yes Necrotic Quality: Adherent Slough Tendon Exposed: No Muscle Exposed: No Joint Exposed: No Bone Exposed: No Treatment Notes Wound #5 (Right Trochanter) 1. Cleanse With Wound Cleanser 2. Periwound Care Skin Prep 3. Primary Dressing Applied Santyl 4. Secondary Dressing Foam Border Dressing Electronic Signature(s) Signed: 07/16/2019 5:02:18 PM By: Yevonne Pax RN Signed: 07/16/2019 5:27:06 PM By: Zandra Abts RN, BSN Entered By: Zandra Abts on 07/16/2019 13:05:00 -------------------------------------------------------------------------------- Wound Assessment Details Patient Name: Date of Service: Dola Factor [facility] 07/16/2019 10:30 A M Medical Record Number: [id number] Patient Account Number: [id number] Date of Birth/Sex: Treating RN: [birth year] (78 y.o. Judie Petit) Yevonne Pax Primary Care Amritha Yorke: Pecola Lawless Other Clinician: Referring Keyetta Hollingworth: Treating Dashel Goines/Extender: Alverda Skeans Weeks in Treatment: 0 Wound Status Wound Number: 6 Primary Pressure Ulcer Etiology: Wound Location: Right Ischium Wound Open Wounding Event: Gradually Appeared Status: Date Acquired: 06/27/2019 Comorbid Chronic Obstructive Pulmonary Disease (COPD), Coronary Weeks Of Treatment: 0 History: Artery Disease, Hypertension Clustered Wound: No Wound Measurements Length: (cm) 4.5 Width: (cm) 4.5 Depth: (cm) 0.1 Area: (cm) 15.904 Volume: (cm) 1.59 % Reduction in Area: 0% % Reduction in Volume: 0% Epithelialization: None Tunneling: No Undermining: No Wound Description Classification: Unstageable/Unclassified Exudate Amount: Medium Exudate Type: Serosanguineous Exudate Color: red, brown Foul Odor After Cleansing: No Slough/Fibrino Yes Wound Bed Granulation Amount: Medium  (34-66%) Exposed Structure Granulation Quality: Pink Fat Layer (Subcutaneous Tissue) Exposed: Yes Necrotic Amount: Medium (34-66%) Necrotic Quality: Adherent Slough Treatment Notes Wound #6 (Right Ischium) 1. Cleanse With Wound Cleanser 2. Periwound Care Skin Prep 3. Primary Dressing Applied Santyl 4. Secondary Dressing Foam Border Dressing Electronic Signature(s) Signed: 07/16/2019 5:02:18 PM By: Yevonne Pax RN Signed: 07/16/2019 5:27:06 PM By: Zandra Abts RN, BSN Entered By: Zandra Abts on 07/16/2019 12:41:11 -------------------------------------------------------------------------------- Wound Assessment Details Patient Name: Date of Service: Dola Factor [facility] 07/16/2019 10:30 A M Medical Record Number: [id number] Patient Account Number: [id number] Date of Birth/Sex: Treating RN: 12/18/1940 (78 y.o. Judie Petit) Yevonne Pax Primary Care Zakariyah Freimark: Pecola Lawless Other Clinician: Referring Vonette Grosso: Treating Jackob Crookston/Extender: Alverda Skeans Weeks in Treatment: 0 Wound Status Wound Number: 7 Primary Pressure Ulcer Etiology: Wound Location: Right Gluteus Wound Open Wounding Event: Gradually Appeared Status: Date Acquired: 07/03/2019 Comorbid Chronic Obstructive Pulmonary Disease (COPD), Coronary Weeks Of Treatment: 0 History: Artery Disease, Hypertension Clustered Wound: No Wound Measurements Length: (cm) 6 Width: (cm) 4 Depth: (cm) 0.1 Area: (cm) 18.85 Volume: (cm) 1.885 % Reduction in Area: 0% % Reduction in Volume: 0% Epithelialization: None Tunneling: No Undermining: No Wound Description Classification: Unstageable/Unclassified Exudate Amount: Medium Exudate Type: Serosanguineous Exudate Color: red, brown Foul Odor After Cleansing: No Slough/Fibrino Yes Wound Bed Granulation Amount: Medium (34-66%) Granulation Quality: Pink Necrotic Amount: Medium (34-66%) Necrotic Quality: Eschar, Adherent Slough Treatment Notes Wound #7 (Right  Gluteus) 1. Cleanse With Wound Cleanser 2. Periwound Care Skin Prep 3. Primary Dressing Applied Santyl 4. Secondary Dressing Foam Border Dressing Electronic Signature(s) Signed: 07/16/2019 5:02:18 PM By: Yevonne Pax RN Signed: 07/16/2019 5:27:06 PM By: Zandra Abts RN, BSN Entered By: Zandra Abts on 07/16/2019 13:04:09 -------------------------------------------------------------------------------- Wound Assessment Details Patient Name: Date of Service: Dola Factor [facility] 07/16/2019 10:30 A M Medical Record Number: [id number]  Patient Account Number: [id number] Date of Birth/Sex: Treating RN: 1940-10-28 (78 y.o. Judie Petit) Yevonne Pax Primary Care Yorley Buch: Pecola Lawless Other Clinician: Referring Howie Rufus: Treating Coralee Edberg/Extender: Alverda Skeans Weeks in Treatment: 0 Wound Status Wound Number: 8 Primary Pressure Ulcer Etiology: Wound Location: Left Ischium Wound Open Wounding Event: Gradually Appeared Status: Date Acquired: 06/27/2019 Comorbid Chronic Obstructive Pulmonary Disease (COPD), Coronary Weeks Of Treatment: 0 History: Artery Disease, Hypertension Clustered Wound: No Wound Measurements Length: (cm) 5 Width: (cm) 2 Depth: (cm) 0.1 Area: (cm) 7.854 Volume: (cm) 0.785 % Reduction in Area: 0% % Reduction in Volume: 0% Epithelialization: None Tunneling: No Undermining: No Wound Description Classification: Unstageable/Unclassified Exudate Amount: Medium Exudate Type: Serosanguineous Exudate Color: red, brown Foul Odor After Cleansing: No Slough/Fibrino Yes Wound Bed Granulation Amount: None Present (0%) Exposed Structure Necrotic Amount: Large (67-100%) Fascia Exposed: No Necrotic Quality: Adherent Slough Fat Layer (Subcutaneous Tissue) Exposed: No Tendon Exposed: No Muscle Exposed: No Joint Exposed: No Bone Exposed: No Treatment Notes Wound #8 (Left Ischium) 1. Cleanse With Wound Cleanser 2. Periwound Care Skin Prep 3.  Primary Dressing Applied Santyl 4. Secondary Dressing Foam Border Dressing Electronic Signature(s) Signed: 07/16/2019 5:02:18 PM By: Yevonne Pax RN Signed: 07/16/2019 5:27:06 PM By: Zandra Abts RN, BSN Entered By: Zandra Abts on 07/16/2019 13:05:21 -------------------------------------------------------------------------------- Vitals Details Patient Name: Date of Service: Dola Factor [facility] 07/16/2019 10:30 A M Medical Record Number: [id number] Patient Account Number: [id number] Date of Birth/Sex: Treating RN: Oct 09, 1940 (78 y.o. Judie Petit) Yevonne Pax Primary Care Larron Armor: Pecola Lawless Other Clinician: Referring Romey Mathieson: Treating Jeliyah Middlebrooks/Extender: Roselie Awkward in Treatment: 0 Vital Signs Time Taken: 11:31 Temperature (F): 98.4 Height (in): 71 Pulse (bpm): 107 Source: Stated Respiratory Rate (breaths/min): 22 Weight (lbs): 126 Blood Pressure (mmHg): 147/75 Source: Stated Reference Range: 80 - 120 mg / dl Body Mass Index (BMI): 17.6 Electronic Signature(s) Signed: 07/16/2019 5:02:18 PM By: Yevonne Pax RN Entered By: Yevonne Pax on 07/16/2019 11:32:27

## 2019-07-17 ENCOUNTER — Other Ambulatory Visit: Payer: Self-pay

## 2019-07-17 ENCOUNTER — Non-Acute Institutional Stay: Payer: Medicare Other | Admitting: Internal Medicine

## 2019-07-17 ENCOUNTER — Encounter: Payer: Self-pay | Admitting: Internal Medicine

## 2019-07-17 VITALS — Wt 143.4 lb

## 2019-07-17 DIAGNOSIS — Z7189 Other specified counseling: Secondary | ICD-10-CM

## 2019-07-17 DIAGNOSIS — Z515 Encounter for palliative care: Secondary | ICD-10-CM

## 2019-07-17 NOTE — Progress Notes (Signed)
May 4th, 2021 [facility] Palliative Care Consult Note Telephone: [phone]  Fax: [phone]   PATIENT NAME: Paul Suarez DOB: Jun 20, 1940 MRN: [id number]  [address] [place]  [id number] ([name]) Paul Suarez (admit date 05/24/2019)  rm 201    PRIMARY CARE PROVIDER:   Pecola Lawless, MD  [address] [place],  [place] [address]   Dr. Baltazar Najjar (wound clinic) seen 07/16/19:   REFERRING PROVIDER:  Dr. Georgann Housekeeper  RESPONSIBLE PARTY: (dtr/HCPOA) [name] Earley Favor [phone]. (dtr) Claudie Revering [phone]    ASSESSMENT / RECOMMENDATIONS:  1. Advance Care Planning: A. Directives: DNR and MOST present in facility records and in [facility] EMR. MOST details: DNR/DNT. Full scope of Medical Intervention. Yes to antibiotics and IVFs. Yes to feeding tube by default.    B. Goals of Care: [name] would like for patient to find comfort and happiness with anticipation of long-term facility placement, and reconnection with his family. Family is amenable to hospice referral if patient reaches point where he would no longer wish hospitalization for medical emergencies.  Hoping for improvement in wounds. Wound clinic is awaiting results of ABIs to ascertain if safe to debride RLE wounds.                 2. Cognitive / Functional status: Patient is A & O to self and place. He is conversant on topic and is in good spirits, probably as he and his daughters have been bonding and connecting over the last few months. He had an essential tremor bilateral UEs. He is soft spoken, speaking in short but complete sentences. He can make his needs known. Staff report he seems a little weaker since his second COVID shot last week; initial fever now resolved. He is dependent for dressing and hygiene. He is incontinent of bowel and bladder. He is able to feed himself. Can take his meds if crushed and mixed in soft foods. He has a L BKA, and is wheelchair bound. Staff report he can pivot transfer  with heavy assist.  He can feed himself. Patient rolled out of bed early this morning; no injuries. Current weight is 125.8lbs, which is stable from 2 months ago, and a loss of 16.2lbs (11% of body weight) over the last 4 months. At a height of 5'11" his BMI is 17.7kg/m2. Staff report patient is consumes about 50% of meals. Patient is on chronic O2 currently at 4LPM. He has multi wounds (unstageable pressure areas on R hip, R ischium, R buttock, L ischial tuberosities, R lateral foot) that have progressed; managed by facility wound care nurse and at the wound care clinic.    3. Family Supports: Patient was a previous resident at [facility] in [place], before recent hospital admissions and rehab. Patient is divorced many years. He is a retired Paediatric nurse. He has 3 daughters and one son. I spoke with daughter/HCPOA [name] at the facility today, with visit updates. Other daughter Paul Suarez joined in via [employer].   4. Follow up Palliative Care Visit: Within the next few months.   I spent 60 minutes providing this consultation from 4pm to 5pm. More than 50% of the time in this consultation was spent coordinating communication.    HISTORY OF PRESENT ILLNESS:  Paul Suarez is a 79 y.o. 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 -05/11/2019-05/20/2019 (after staphylococcal sepsis 2/2 pneumonia).  -  3/8-3/01/2020 hospitalized with aspiration pneumonia (recurrent) thought r/t his GERD/regurg 2/2 large hiatal hernia. He has a lung abscess. Chronic COPD/emphysema (chronic O2). Recent COVID-19 infection. 3/17-3/24 Hospitalized hemoptysis. 2us PRBCs/nasal packing/hold of Plavix. Poor oral intake, aspiration pneumonia. Discharged to Hospice residential facility in [facility] but readmitted back to [facility] within a day or so.   This is a f/u Palliative Care visit from 05/28/2019.    CODE  STATUS: DNR   PPS: 30%   HOSPICE ELIGIBILITY/DIAGNOSIS: TBD  PAST MEDICAL HISTORY:  Past Medical History:  Diagnosis Date  . CAD (coronary artery disease)   . COPD (chronic obstructive pulmonary disease) ([place])   . Depression   . Essential hypertension   . Seizure disorder ([place])   . Staphylococcus aureus bacteremia 05/06/2019    SOCIAL HX:  Social History   Tobacco Use  . Smoking status: Former Smoker    Years: 20.00  . Smokeless tobacco: Never Used  Substance Use Topics  . Alcohol use: Not Currently    ALLERGIES: No Known Allergies   PERTINENT MEDICATIONS:  Outpatient Encounter Medications as of 07/17/2019  Medication Sig  . albuterol (VENTOLIN HFA) 108 (90 Base) MCG/ACT inhaler Inhale 2 puffs into the lungs every 6 (six) hours as needed for shortness of breath (FOR Shortness of Breath  (S.O.B.)).  . divalproex (DEPAKOTE ER) 500 MG 24 hr tablet Take 1 tablet (500 mg total) by mouth at bedtime.  . docusate sodium (COLACE) 100 MG capsule Take 1 capsule (100 mg total) by mouth at bedtime.  Marland Kitchen guaiFENesin (ROBITUSSIN) 100 MG/5ML liquid Take 200 mg by mouth 4 (four) times daily as needed for cough.  . Infant Care Products (DERMACLOUD) CREA Apply topically. Every shift  . ipratropium-albuterol (DUONEB) 0.5-2.5 (3) MG/3ML SOLN Take 3 mLs by nebulization every 6 (six) hours as needed (sob/wheezing).  Marland Kitchen loratadine (CLARITIN) 10 MG tablet Take 1 tablet (10 mg total) by mouth daily.  . Melatonin 3 MG TABS Take 3 mg by mouth at bedtime as needed (sleep).   . Morphine Sulfate (MORPHINE CONCENTRATE) 10 MG/0.5ML SOLN concentrated solution Take 0.25 mLs (5 mg total) by mouth every 3 (three) hours as needed for severe pain or shortness of breath.  . Multiple Vitamin (MULTIVITAMIN WITH MINERALS) TABS tablet Take 1 tablet by mouth daily.  . Multiple Vitamins-Minerals (CEROVITE SENIOR PO) Take 1 tablet by mouth daily.  Marland Kitchen oxymetazoline (AFRIN) 0.05 % nasal spray Place 1 spray into both nostrils once  as needed for congestion (Bedside procedure).  . pantoprazole (PROTONIX) 40 MG tablet Take 1 tablet (40 mg total) by mouth daily.  . sertraline (ZOLOFT) 50 MG tablet Take 25 mg by mouth daily.   . silver nitrate applicators 62-37 % applicator Apply 1 Stick topically once as needed for bleeding (Bedside procedure).  . sodium chloride (OCEAN) 0.65 % SOLN nasal spray Place 1 spray into the nose 2 (two) times daily.  Marland Kitchen tiotropium (SPIRIVA HANDIHALER) 18 MCG inhalation capsule Place 1 capsule (18 mcg total) into inhaler and inhale daily.  . vitamin C (ASCORBIC ACID) 500 MG tablet Take 1 tablet (500 mg total) by mouth daily.   No facility-administered encounter medications on file as of 07/17/2019.    PHYSICAL EXAM:   BP 100/60, HR 104, RR 28 116/60, 96 regular, R 18 Sat 96% on 4LPM  General: NAD, frail appearing, thin. Upper extremity tremor.  Cardiovascular: regular rate and rhythm Pulmonary: scattered insp harsh sounds referred from upper airway. Abdomen: soft, nontender, + bowel sounds Extremities: R  LE edema softly pitting to high calf. Left below the knee amputation.  Skin: scattered purpura exposed UEs Neurological: Weakness but otherwise non-focal  Anselm Lis, NP

## 2019-07-23 ENCOUNTER — Other Ambulatory Visit: Payer: Self-pay

## 2019-07-23 ENCOUNTER — Emergency Department (HOSPITAL_COMMUNITY): Payer: Medicare Other

## 2019-07-23 ENCOUNTER — Inpatient Hospital Stay (HOSPITAL_COMMUNITY)
Admission: EM | Admit: 2019-07-23 | Discharge: 2019-07-27 | DRG: 871 | Disposition: A | Discharge status: Hospice | Payer: Medicare Other | Attending: Internal Medicine | Admitting: Internal Medicine

## 2019-07-23 ENCOUNTER — Encounter (HOSPITAL_COMMUNITY): Payer: Self-pay

## 2019-07-23 DIAGNOSIS — R6520 Severe sepsis without septic shock: Secondary | ICD-10-CM | POA: Diagnosis not present

## 2019-07-23 DIAGNOSIS — F329 Major depressive disorder, single episode, unspecified: Secondary | ICD-10-CM | POA: Diagnosis present

## 2019-07-23 DIAGNOSIS — Z87891 Personal history of nicotine dependence: Secondary | ICD-10-CM

## 2019-07-23 DIAGNOSIS — I10 Essential (primary) hypertension: Secondary | ICD-10-CM | POA: Diagnosis present

## 2019-07-23 DIAGNOSIS — Z89512 Acquired absence of left leg below knee: Secondary | ICD-10-CM | POA: Diagnosis not present

## 2019-07-23 DIAGNOSIS — E860 Dehydration: Secondary | ICD-10-CM | POA: Diagnosis present

## 2019-07-23 DIAGNOSIS — L89156 Pressure-induced deep tissue damage of sacral region: Secondary | ICD-10-CM | POA: Diagnosis present

## 2019-07-23 DIAGNOSIS — G40909 Epilepsy, unspecified, not intractable, without status epilepticus: Secondary | ICD-10-CM | POA: Diagnosis present

## 2019-07-23 DIAGNOSIS — Z66 Do not resuscitate: Secondary | ICD-10-CM | POA: Diagnosis present

## 2019-07-23 DIAGNOSIS — J9601 Acute respiratory failure with hypoxia: Secondary | ICD-10-CM | POA: Diagnosis not present

## 2019-07-23 DIAGNOSIS — L8990 Pressure ulcer of unspecified site, unspecified stage: Secondary | ICD-10-CM

## 2019-07-23 DIAGNOSIS — N179 Acute kidney failure, unspecified: Secondary | ICD-10-CM | POA: Diagnosis present

## 2019-07-23 DIAGNOSIS — E440 Moderate protein-calorie malnutrition: Secondary | ICD-10-CM | POA: Diagnosis present

## 2019-07-23 DIAGNOSIS — K449 Diaphragmatic hernia without obstruction or gangrene: Secondary | ICD-10-CM | POA: Diagnosis present

## 2019-07-23 DIAGNOSIS — J690 Pneumonitis due to inhalation of food and vomit: Secondary | ICD-10-CM | POA: Diagnosis present

## 2019-07-23 DIAGNOSIS — Z87440 Personal history of urinary (tract) infections: Secondary | ICD-10-CM | POA: Diagnosis not present

## 2019-07-23 DIAGNOSIS — Z888 Allergy status to other drugs, medicaments and biological substances status: Secondary | ICD-10-CM | POA: Diagnosis not present

## 2019-07-23 DIAGNOSIS — Z8616 Personal history of COVID-19: Secondary | ICD-10-CM | POA: Diagnosis not present

## 2019-07-23 DIAGNOSIS — E611 Iron deficiency: Secondary | ICD-10-CM | POA: Diagnosis present

## 2019-07-23 DIAGNOSIS — J9621 Acute and chronic respiratory failure with hypoxia: Secondary | ICD-10-CM | POA: Diagnosis present

## 2019-07-23 DIAGNOSIS — L89892 Pressure ulcer of other site, stage 2: Secondary | ICD-10-CM | POA: Diagnosis present

## 2019-07-23 DIAGNOSIS — I2510 Atherosclerotic heart disease of native coronary artery without angina pectoris: Secondary | ICD-10-CM | POA: Diagnosis present

## 2019-07-23 DIAGNOSIS — J449 Chronic obstructive pulmonary disease, unspecified: Secondary | ICD-10-CM | POA: Diagnosis present

## 2019-07-23 DIAGNOSIS — Z79899 Other long term (current) drug therapy: Secondary | ICD-10-CM

## 2019-07-23 DIAGNOSIS — Z515 Encounter for palliative care: Secondary | ICD-10-CM | POA: Diagnosis present

## 2019-07-23 DIAGNOSIS — L89212 Pressure ulcer of right hip, stage 2: Secondary | ICD-10-CM | POA: Diagnosis present

## 2019-07-23 DIAGNOSIS — A419 Sepsis, unspecified organism: Secondary | ICD-10-CM | POA: Diagnosis present

## 2019-07-23 DIAGNOSIS — L89610 Pressure ulcer of right heel, unstageable: Secondary | ICD-10-CM | POA: Diagnosis present

## 2019-07-23 DIAGNOSIS — D638 Anemia in other chronic diseases classified elsewhere: Secondary | ICD-10-CM | POA: Diagnosis present

## 2019-07-23 DIAGNOSIS — Z9981 Dependence on supplemental oxygen: Secondary | ICD-10-CM

## 2019-07-23 DIAGNOSIS — L89890 Pressure ulcer of other site, unstageable: Secondary | ICD-10-CM | POA: Diagnosis present

## 2019-07-23 DIAGNOSIS — A4102 Sepsis due to Methicillin resistant Staphylococcus aureus: Secondary | ICD-10-CM | POA: Diagnosis present

## 2019-07-23 DIAGNOSIS — L89159 Pressure ulcer of sacral region, unspecified stage: Secondary | ICD-10-CM | POA: Diagnosis present

## 2019-07-23 DIAGNOSIS — D649 Anemia, unspecified: Secondary | ICD-10-CM

## 2019-07-23 DIAGNOSIS — L89510 Pressure ulcer of right ankle, unstageable: Secondary | ICD-10-CM | POA: Diagnosis present

## 2019-07-23 DIAGNOSIS — Z8701 Personal history of pneumonia (recurrent): Secondary | ICD-10-CM

## 2019-07-23 LAB — CBC WITH DIFFERENTIAL/PLATELET
Abs Immature Granulocytes: 0.06 10*3/uL (ref 0.00–0.07)
Basophils Absolute: 0.1 10*3/uL (ref 0.0–0.1)
Basophils Relative: 1 %
Eosinophils Absolute: 0.1 10*3/uL (ref 0.0–0.5)
Eosinophils Relative: 1 %
HCT: 24.1 % — ABNORMAL LOW (ref 39.0–52.0)
Hemoglobin: 7.2 g/dL — ABNORMAL LOW (ref 13.0–17.0)
Immature Granulocytes: 1 %
Lymphocytes Relative: 17 %
Lymphs Abs: 1.8 10*3/uL (ref 0.7–4.0)
MCH: 24.1 pg — ABNORMAL LOW (ref 26.0–34.0)
MCHC: 29.9 g/dL — ABNORMAL LOW (ref 30.0–36.0)
MCV: 80.6 fL (ref 80.0–100.0)
Monocytes Absolute: 1.1 10*3/uL — ABNORMAL HIGH (ref 0.1–1.0)
Monocytes Relative: 11 %
Neutro Abs: 7.3 10*3/uL (ref 1.7–7.7)
Neutrophils Relative %: 69 %
Platelets: 482 10*3/uL — ABNORMAL HIGH (ref 150–400)
RBC: 2.99 MIL/uL — ABNORMAL LOW (ref 4.22–5.81)
RDW: 18.1 % — ABNORMAL HIGH (ref 11.5–15.5)
WBC: 10.4 10*3/uL (ref 4.0–10.5)
nRBC: 0 % (ref 0.0–0.2)

## 2019-07-23 LAB — APTT: aPTT: 33 seconds (ref 24–36)

## 2019-07-23 LAB — COMPREHENSIVE METABOLIC PANEL
ALT: 14 U/L (ref 0–44)
AST: 22 U/L (ref 15–41)
Albumin: 2 g/dL — ABNORMAL LOW (ref 3.5–5.0)
Alkaline Phosphatase: 57 U/L (ref 38–126)
Anion gap: 11 (ref 5–15)
BUN: 37 mg/dL — ABNORMAL HIGH (ref 8–23)
CO2: 25 mmol/L (ref 22–32)
Calcium: 7.9 mg/dL — ABNORMAL LOW (ref 8.9–10.3)
Chloride: 109 mmol/L (ref 98–111)
Creatinine, Ser: 1.85 mg/dL — ABNORMAL HIGH (ref 0.61–1.24)
GFR calc Af Amer: 40 mL/min — ABNORMAL LOW (ref >60)
GFR calc non Af Amer: 34 mL/min — ABNORMAL LOW (ref >60)
Glucose, Bld: 177 mg/dL — ABNORMAL HIGH (ref 70–99)
Potassium: 4.1 mmol/L (ref 3.5–5.1)
Sodium: 145 mmol/L (ref 135–145)
Total Bilirubin: 0.4 mg/dL (ref 0.3–1.2)
Total Protein: 6 g/dL — ABNORMAL LOW (ref 6.5–8.1)

## 2019-07-23 LAB — TYPE AND SCREEN
ABO/RH(D): A POS
Antibody Screen: NEGATIVE

## 2019-07-23 LAB — PROTIME-INR
INR: 1.1 (ref 0.8–1.2)
Prothrombin Time: 13.8 seconds (ref 11.4–15.2)

## 2019-07-23 LAB — LACTIC ACID, PLASMA
Lactic Acid, Venous: 2.7 mmol/L (ref 0.5–1.9)
Lactic Acid, Venous: 0.7 mmol/L (ref 0.5–1.9)

## 2019-07-23 LAB — PREPARE RBC (CROSSMATCH)

## 2019-07-23 MED ORDER — SODIUM CHLORIDE 0.9% IV SOLUTION: Freq: Once | INTRAVENOUS | Status: DC

## 2019-07-23 MED ORDER — METRONIDAZOLE IN NACL 5-0.79 MG/ML-% IV SOLN
500.0000 mg | Freq: Three times a day (TID) | INTRAVENOUS | Status: DC
  Administered 2019-07-23 – 2019-07-26 (×8): 500 mg via INTRAVENOUS
  Filled 2019-07-23 (×8): qty 100

## 2019-07-23 MED ORDER — SODIUM CHLORIDE 0.9 % IV SOLN
2.0000 g | Freq: Two times a day (BID) | INTRAVENOUS | Status: DC
  Administered 2019-07-24 – 2019-07-26 (×5): 2 g via INTRAVENOUS
  Filled 2019-07-23 (×6): qty 2

## 2019-07-23 MED ORDER — METRONIDAZOLE IN NACL 5-0.79 MG/ML-% IV SOLN
500.0000 mg | Freq: Once | INTRAVENOUS | Status: AC
  Administered 2019-07-23: 500 mg via INTRAVENOUS
  Filled 2019-07-23: qty 100

## 2019-07-23 MED ORDER — POLYETHYLENE GLYCOL 3350 17 G PO PACK
17.0000 g | PACK | Freq: Every day | ORAL | Status: DC
  Administered 2019-07-26 – 2019-07-27 (×2): 17 g via ORAL
  Filled 2019-07-23 (×2): qty 1

## 2019-07-23 MED ORDER — SODIUM CHLORIDE 0.9 % IV SOLN
2.0000 g | Freq: Once | INTRAVENOUS | Status: AC
  Administered 2019-07-23: 2 g via INTRAVENOUS
  Filled 2019-07-23: qty 2

## 2019-07-23 MED ORDER — SODIUM CHLORIDE 0.9 % IV SOLN: 10.0000 mL/h | Freq: Once | INTRAVENOUS | Status: DC

## 2019-07-23 MED ORDER — PRO-STAT SUGAR FREE PO LIQD
30.0000 mL | Freq: Two times a day (BID) | ORAL | Status: DC
  Administered 2019-07-27: 30 mL via ORAL
  Filled 2019-07-23 (×4): qty 30

## 2019-07-23 MED ORDER — VANCOMYCIN HCL 1250 MG/250ML IV SOLN
1250.0000 mg | Freq: Once | INTRAVENOUS | Status: AC
  Administered 2019-07-23: 1250 mg via INTRAVENOUS
  Filled 2019-07-23: qty 250

## 2019-07-23 MED ORDER — PANTOPRAZOLE SODIUM 40 MG PO TBEC
40.0000 mg | DELAYED_RELEASE_TABLET | Freq: Every day | ORAL | Status: DC
  Administered 2019-07-24 – 2019-07-27 (×4): 40 mg via ORAL
  Filled 2019-07-23 (×4): qty 1

## 2019-07-23 MED ORDER — VANCOMYCIN HCL IN DEXTROSE 1-5 GM/200ML-% IV SOLN
1000.0000 mg | INTRAVENOUS | Status: DC
  Administered 2019-07-24 – 2019-07-25 (×2): 1000 mg via INTRAVENOUS
  Filled 2019-07-23 (×3): qty 200

## 2019-07-23 MED ORDER — ONDANSETRON HCL 4 MG/2ML IJ SOLN: 4.0000 mg | Freq: Four times a day (QID) | INTRAMUSCULAR | Status: DC | PRN

## 2019-07-23 MED ORDER — LORAZEPAM 0.5 MG PO TABS: 0.5000 mg | ORAL_TABLET | ORAL | Status: DC | PRN

## 2019-07-23 MED ORDER — LACTATED RINGERS IV BOLUS (SEPSIS)
1000.0000 mL | Freq: Once | INTRAVENOUS | Status: AC
  Administered 2019-07-23: 1000 mL via INTRAVENOUS

## 2019-07-23 MED ORDER — ENSURE ENLIVE PO LIQD
237.0000 mL | Freq: Two times a day (BID) | ORAL | Status: DC
  Administered 2019-07-25 – 2019-07-27 (×5): 237 mL via ORAL

## 2019-07-23 MED ORDER — ACETAMINOPHEN 325 MG PO TABS
650.0000 mg | ORAL_TABLET | ORAL | Status: DC | PRN
  Administered 2019-07-25 – 2019-07-26 (×2): 650 mg via ORAL
  Filled 2019-07-23 (×2): qty 2

## 2019-07-23 MED ORDER — ALBUTEROL SULFATE (2.5 MG/3ML) 0.083% IN NEBU: 2.5000 mg | INHALATION_SOLUTION | RESPIRATORY_TRACT | Status: DC | PRN

## 2019-07-23 MED ORDER — ALUM & MAG HYDROXIDE-SIMETH 200-200-20 MG/5ML PO SUSP: 30.0000 mL | ORAL | Status: DC | PRN

## 2019-07-23 MED ORDER — MIRTAZAPINE 15 MG PO TABS
7.5000 mg | ORAL_TABLET | Freq: Every day | ORAL | Status: DC
  Administered 2019-07-23 – 2019-07-26 (×4): 7.5 mg via ORAL
  Filled 2019-07-23 (×4): qty 1

## 2019-07-23 MED ORDER — HYDROMORPHONE HCL 2 MG PO TABS: 1.0000 mg | ORAL_TABLET | ORAL | Status: DC | PRN

## 2019-07-23 MED ORDER — VANCOMYCIN HCL IN DEXTROSE 1-5 GM/200ML-% IV SOLN: 1000.0000 mg | Freq: Once | INTRAVENOUS | Status: DC

## 2019-07-23 MED ORDER — VITAMIN D 25 MCG (1000 UNIT) PO TABS
1000.0000 [IU] | ORAL_TABLET | Freq: Every day | ORAL | Status: DC
  Administered 2019-07-24 – 2019-07-27 (×4): 1000 [IU] via ORAL
  Filled 2019-07-23 (×4): qty 1

## 2019-07-23 MED ORDER — DIPHENHYDRAMINE HCL 25 MG PO CAPS: 25.0000 mg | ORAL_CAPSULE | Freq: Every day | ORAL | Status: DC | PRN

## 2019-07-23 MED ORDER — BISACODYL 5 MG PO TBEC
10.0000 mg | DELAYED_RELEASE_TABLET | Freq: Every day | ORAL | Status: DC | PRN
  Filled 2019-07-23: qty 2

## 2019-07-23 NOTE — Progress Notes (Signed)
Pharmacy Antibiotic Note  Paul Suarez is a 79 y.o. male admitted on 07/23/2019 with sepsis.  Pharmacy has been consulted for vancomycin & cefepime dosing  Plan: Cefepime 2 gm IV q12 Flagyl 500 mg IV q8h per MD Vancomycin 1250 mg loading dose given at 1730 Vancomycin 1000 mg IV q24 for Vanc trough goal 15-20 mcg/ml F/u renal fxn, WBC, temp, culture data Vancomycin trough as needed   Daily Scr while on both vancomycin & zosyn  Temp (24hrs), Avg:98.5 F (36.9 C), Min:98.5 F (36.9 C), Max:98.5 F (36.9 C)  Recent Labs  Lab 07/23/19 1520 07/23/19 1524 07/23/19 1730  WBC  --  10.4  --   CREATININE  --  1.85*  --   LATICACIDVEN 2.7*  --  0.7    Estimated Creatinine Clearance: 30.3 mL/min (A) (by C-G formula based on SCr of 1.85 mg/dL (H)).    Allergies  Allergen Reactions  . Keflex [Cephalexin]     Allergy listed on MAR (no details), date unknown but has tolerated cefepime, Rocephin, Zosyn, and Augmentin in the past year. Last cefazolin was in Feb 2021.    Antimicrobials this admission: 5/10 vanc>> 5/10 flagyl>> 5/10 cefepime>> Dose adjustments this admission:   Microbiology results: 5/10 BCx2: sent 5/10 UCx: ordered  Thank you for allowing pharmacy to be a part of this patient's care.  Herby Abraham, Pharm.D [phone] 07/23/2019 8:27 PM

## 2019-07-23 NOTE — ED Notes (Signed)
In and out cath performed as ordered. There was no urine. Pt had wet brief and was given pericare and dry brief at 2000. Condom catheter placed.

## 2019-07-23 NOTE — ED Triage Notes (Signed)
Pt BIB EMS from [facility]. Pt daughter called EMS to have pt evaluated. Pt has sacral pressure sore. Pt is confused at baseline. EMS reports pt has cough. EMS reports pt is on 5L [place], this is patients home O2 regimen.  101.5 temp 112 HR 120/58 End tidal 22 94% [place] 5L  EMS gave 1000 mg tylenol NS  18 G L hand

## 2019-07-23 NOTE — ED Notes (Signed)
Date and time results received: 07/23/19 5:03 PM  (use smartphrase ".now" to insert current time)  Test: Lactic Critical Value: 2.7  Name of Provider Notified: [place]  Orders Received? Or Actions Taken?: Orders Received - See Orders for details

## 2019-07-23 NOTE — H&P (Signed)
History and Physical    Lehman Whiteley IWP:[id number] DOB: Dec 31, 1940 DOA: 07/23/2019  PCP: Hendricks Limes, MD  Patient coming from: SNF  I have personally briefly reviewed patient's old medical records in [place]  Chief Complaint: Fever, cough  HPI: Paul Suarez is a 79 y.o. male with medical history significant of COPD, HTN, CAD, chronic O2 requirement.  Admitted in Feb staph aureus bacteremia secondary to multifocal aspiration PNA.  Also has h/o ESBL UTI in Feb, COVID+ in Feb.  Lung abscess in Early March, aspiration syndrome secondary to hiatal hernia.  Re-admitted late March for aspiration due to epistaxis, taken off of plavix.  Ultimately discharged with pal care.  Per pt MOST form, pal care note from 6 days ago, and daughter who is at bed side: do all medical treatment including ABx, blood transfusions, etc, but pt is DNR/DNI.  Pt brought in to ED today with worsened cough (wet sounding), fever of 101.5, tachycardia, tachypnea.  Symptoms onset ~3 days ago, gradually worsening, nothing makes better.  Associated chills, congestion, dysuria.   ED Course: Also has sacral decubitus with mild erythema, and lateral R foot decubiti.  Pt given tylenol for fever by EMS, so is A.Febrile by the time he is in ED.  CXR reveals: persistent pneumonitis, persistent but improved consolidation.  WBC 10k  Lactate nl.  Tachypnea to the 40s.  Satting 100% on 5L.  HGB 7.2 down from 8.9 in March.  No gross bleed, but cant get hemoccult due to power outage here at [facility].  UA pending  Started on cefepime / flagyl / vanc.   Review of Systems: As per HPI, otherwise all review of systems negative.  Past Medical History:  Diagnosis Date  . CAD (coronary artery disease)   . COPD (chronic obstructive pulmonary disease) ([place])   . Depression   . Essential hypertension   . Seizure disorder ([place])   . Staphylococcus aureus bacteremia 05/06/2019    Past Surgical History:  Procedure  Laterality Date  . BELOW KNEE LEG AMPUTATION Left      reports that he has quit smoking. He quit after 20.00 years of use. He has never used smokeless tobacco. He reports previous alcohol use. He reports that he does not use drugs.  Allergies  Allergen Reactions  . Keflex [Cephalexin]     Allergy listed on MAR (no details), date unknown but has tolerated cefepime, Rocephin, Zosyn, and Augmentin in the past year. Last cefazolin was in Feb 2021.    History reviewed. No pertinent family history.   Prior to Admission medications   Medication Sig Start Date End Date Taking? Authorizing Provider  acetaminophen (TYLENOL) 325 MG tablet Take 650 mg by mouth every 4 (four) hours as needed for moderate pain.  06/13/19  Yes [provider]  albuterol (PROVENTIL) (2.5 MG/3ML) 0.083% nebulizer solution Take 2.5 mg by nebulization every 4 (four) hours as needed for wheezing or shortness of breath.  06/13/19  Yes [provider]  alum & mag hydroxide-simeth (MYLANTA MAXIMUM STRENGTH) 400-400-40 MG/5ML suspension Take 30 mLs by mouth every 4 (four) hours as needed for indigestion.   Yes [provider]  Amino Acids-Protein Hydrolys (FEEDING SUPPLEMENT, PRO-STAT SUGAR FREE 64,) LIQD Take 30 mLs by mouth in the morning and at bedtime.   Yes [provider]  bisacodyl (DULCOLAX) 5 MG EC tablet Take 10 mg by mouth daily as needed for moderate constipation.   Yes [provider]  cholecalciferol (VITAMIN D3) 25 MCG (  1000 UNIT) tablet Take 1,000 Units by mouth daily.   Yes [provider]  diphenhydrAMINE (BENADRYL) 25 MG tablet Take 25 mg by mouth daily as needed for itching.   Yes [provider]  HYDROmorphone (DILAUDID) 2 MG tablet Take 1 mg by mouth every 2 (two) hours as needed for moderate pain or severe pain (dyspnea).  06/13/19  Yes [provider]  LORazepam (ATIVAN) 0.5 MG tablet Take 0.5 mg by mouth every 4 (four) hours as needed for  anxiety (agitation).  06/13/19  Yes [provider]  mirtazapine (REMERON) 15 MG tablet Take 7.5 mg by mouth at bedtime.   Yes [provider]  Multiple Vitamins-Minerals (CEROVITE SENIOR PO) Take 1 tablet by mouth daily.   Yes [provider]  Omeprazole 20 MG TBEC Take 1 tablet by mouth daily. 06/13/19  Yes [provider]  ondansetron (ZOFRAN) 4 MG tablet Take 4 mg by mouth every 6 (six) hours as needed for nausea or vomiting.  06/13/19  Yes [provider]  oxymetazoline (AFRIN) 0.05 % nasal spray Place 1 spray into both nostrils once as needed for congestion (Bedside procedure). 06/06/19  Yes Regalado, Belkys A, MD  polyethylene glycol (MIRALAX / GLYCOLAX) 17 g packet Take 17 g by mouth daily.   Yes [provider]  vitamin C (ASCORBIC ACID) 500 MG tablet Take 1 tablet (500 mg total) by mouth daily. 02/12/19  Yes Medina-Vargas, Monina C, NP  Zinc Sulfate 220 (50 Zn) MG TABS Take 1 tablet by mouth daily. 06/13/19  Yes [provider]    Physical Exam: Vitals:   07/23/19 1830 07/23/19 1915 07/23/19 1930 07/23/19 2018  BP: 132/67 119/60 (!) 120/56 130/69  Pulse: 99 (!) 112 (!) 111 (!) 120  Resp: (!) 32 (!) 26 (!) 33 20  Temp:      TempSrc:      SpO2: 100% 95% 93% 98%    Constitutional: Tachypnic, ill and frail appearing. Eyes: PERRL, lids and conjunctivae normal ENMT: Mucous membranes are moist. Posterior pharynx clear of any exudate or lesions.Normal dentition.  Neck: normal, supple, no masses, no thyromegaly Respiratory: Rhonchi, tachypnea, no accessory muscle use or severe distress though. Cardiovascular: Regular rate and rhythm, no murmurs / rubs / gallops. No extremity edema. 2+ pedal pulses. No carotid bruits.  Abdomen: no tenderness, no masses palpated. No hepatosplenomegaly. Bowel sounds positive.  Musculoskeletal: L BKA Skin: Sacral decubitus with erythema, R lateral foot decubitus, no surrounding  erythema. Neurologic: CN 2-12 grossly intact. Sensation intact, DTR normal. Strength 5/5 in all 4.  Psychiatric: Normal judgment and insight. Alert and oriented x 3. Normal mood.    Labs on Admission: I have personally reviewed following labs and imaging studies  CBC: Recent Labs  Lab 07/23/19 1524  WBC 10.4  NEUTROABS 7.3  HGB 7.2*  HCT 24.1*  MCV 80.6  PLT 482*   Basic Metabolic Panel: Recent Labs  Lab 07/23/19 1524  NA 145  K 4.1  CL 109  CO2 25  GLUCOSE 177*  BUN 37*  CREATININE 1.85*  CALCIUM 7.9*   GFR: Estimated Creatinine Clearance: 30.3 mL/min (A) (by C-G formula based on SCr of 1.85 mg/dL (H)). Liver Function Tests: Recent Labs  Lab 07/23/19 1524  AST 22  ALT 14  ALKPHOS 57  BILITOT 0.4  PROT 6.0*  ALBUMIN 2.0*   No results for input(s): LIPASE, AMYLASE in the last 168 hours. No results for input(s): AMMONIA in the last 168 hours. Coagulation Profile:  Recent Labs  Lab 07/23/19 1520  INR 1.1   Cardiac Enzymes: No results for input(s): CKTOTAL, CKMB, CKMBINDEX, TROPONINI in the last 168 hours. BNP (last 3 results) No results for input(s): PROBNP in the last 8760 hours. HbA1C: No results for input(s): HGBA1C in the last 72 hours. CBG: No results for input(s): GLUCAP in the last 168 hours. Lipid Profile: No results for input(s): CHOL, HDL, LDLCALC, TRIG, CHOLHDL, LDLDIRECT in the last 72 hours. Thyroid Function Tests: No results for input(s): TSH, T4TOTAL, FREET4, T3FREE, THYROIDAB in the last 72 hours. Anemia Panel: No results for input(s): VITAMINB12, FOLATE, FERRITIN, TIBC, IRON, RETICCTPCT in the last 72 hours. Urine analysis:    Component Value Date/Time   COLORURINE YELLOW 06/03/2019 1815   APPEARANCEUR CLEAR 06/03/2019 1815   LABSPEC 1.013 06/03/2019 1815   PHURINE 6.0 06/03/2019 1815   GLUCOSEU NEGATIVE 06/03/2019 1815   HGBUR NEGATIVE 06/03/2019 1815   BILIRUBINUR NEGATIVE 06/03/2019 1815   KETONESUR NEGATIVE 06/03/2019 1815    PROTEINUR NEGATIVE 06/03/2019 1815   NITRITE NEGATIVE 06/03/2019 1815   LEUKOCYTESUR NEGATIVE 06/03/2019 1815    Radiological Exams on Admission: DG Chest Port 1 View  Result Date: 07/23/2019 CLINICAL DATA:  Sepsis. EXAM: PORTABLE CHEST 1 VIEW COMPARISON:  June 04, 2019 FINDINGS: There are prominent interstitial lung markings bilaterally. There is a persistent hazy airspace opacity in the right mid lung zone. There is a small right-sided pleural effusion. The heart size is stable. There is no pneumothorax. No acute osseous abnormality. IMPRESSION: 1. Persistent findings of pneumonitis, especially on the right. 2. Persistent but improved right upper and right lower lobe consolidation. 3. Small bilateral pleural effusions, right greater than left. Electronically Signed   By: Katherine Mantle M.D.   On: 07/23/2019 16:36    EKG: Independently reviewed.  Assessment/Plan Principal Problem:   Sepsis (HCC) Active Problems:   Essential hypertension   AKI (acute kidney injury) (HCC)   Decubitus ulcers   Aspiration pneumonia (HCC)   Anemia of chronic disease   COPD (chronic obstructive pulmonary disease) (HCC)    1. Sepsis - 1. Suspect likely due to recurrent aspiration PNA based on respiratory symptoms, other possible sources include decubitus.  UA is pending 2. Empiric cefepime / flagyl / vanc 3. 1L NS in ED + 1u PRBC transfusion 4. Tele monitor 5. Cont pulse ox 6. Switch cefepime to carbapenem if UA comes back positive (h/o ESBL previously). 7. BCx pending 8. Pt on comfort feeds - not restricting diet at this time. 9. Pal care consult in AM 2. AKI - 1. Presumably secondary to #1 above 2. IVF / PRBC as above 3. Repeat BMP in AM 3. Anemia - 1. Presumably chronic dz 2. Hemoccult pending 3. 1u PRBC transfusion as above 4. Repeat CBC in AM 4. COPD - cont home nebs 5. Decubitus ulcers - 1. ABx as above 2. Wound care consult 6. HTN - 1. Off of all BP meds at this point  already.  DVT prophylaxis: SCDs Code Status: Full Family Communication: Daughter at bedside Disposition Plan: SNF after admit Consults called: Pal care consult put into Epic Admission status: Admit to inpatient  Severity of Illness: The appropriate patient status for this patient is INPATIENT. Inpatient status is judged to be reasonable and necessary in order to provide the required intensity of service to ensure the patient's safety. The patient's presenting symptoms, physical exam findings, and initial radiographic and laboratory data in the context of their chronic comorbidities is felt to place  them at high risk for further clinical deterioration. Furthermore, it is not anticipated that the patient will be medically stable for discharge from the hospital within 2 midnights of admission. The following factors support the patient status of inpatient.   IP status due to sepsis with AKI, acute resp failure, tachypnea into the 40s.   * I certify that at the point of admission it is my clinical judgment that the patient will require inpatient hospital care spanning beyond 2 midnights from the point of admission due to high intensity of service, high risk for further deterioration and high frequency of surveillance required.*    Elizah Lydon M. DO [facility]  How to contact the [facility] Attending or Consulting provider 7A - 7P or covering provider during after hours 7P -7A, for this patient?  1. Check the care team in [facility] and look for a) attending/consulting [facility] provider listed and b) the [facility] team listed 2. Log into [email or web address]  Amion Physician Scheduling and messaging for groups and whole hospitals  On call and physician scheduling software for group practices, residents, hospitalists and other medical providers for call, clinic, rotation and shift schedules. On[employer] is a hospital-wide system for scheduling doctors and paging doctors on call. EasyPlot is for scientific  plotting and data analysis.  [email or web address]  and use [place]'s universal password to access. If you do not have the password, please contact the hospital operator.  3. Locate the [facility] provider you are looking for under [facility] and page to a number that you can be directly reached. 4. If you still have difficulty reaching the provider, please page the [facility] (Director on Call) for the Hospitalists listed on amion for assistance.  07/23/2019, 8:30 PM

## 2019-07-23 NOTE — ED Notes (Signed)
RN called to give report, was told that bed has not been approved at this time.

## 2019-07-23 NOTE — Progress Notes (Signed)
A consult was received from an ED physician for vancomycin & cefepime per pharmacy dosing.  The patient's profile has been reviewed for ht/wt/allergies/indication/available labs.   A one time order has been placed for vancomycin 1250 mg IV x 1 dose and cefepime 2 gm IV x 1 dose.    Further antibiotics/pharmacy consults should be ordered by admitting physician if indicated.                       Thank you, Herby Abraham, Pharm.D 07/23/2019 4:12 PM

## 2019-07-23 NOTE — ED Provider Notes (Signed)
[facility]-EMERGENCY DEPT Provider Note   CSN: [id number] Arrival date & time: 07/23/19  1446     History Chief Complaint  Patient presents with   Code Sepsis    Paul Suarez is a 79 y.o. male.  The history is provided by the patient and medical records. No language interpreter was used.  Fever Max temp prior to arrival:  101.5 Temp source:  Oral Severity:  Moderate Onset quality:  Gradual Duration:  3 days Timing:  Constant Progression:  Worsening Chronicity:  Recurrent Relieved by:  Acetaminophen Worsened by:  Nothing Ineffective treatments:  None tried Associated symptoms: chills, congestion, cough and dysuria   Associated symptoms: no chest pain, no confusion, no diarrhea, no headaches, no nausea, no rhinorrhea and no vomiting   Risk factors: recent sickness        Past Medical History:  Diagnosis Date   CAD (coronary artery disease)    COPD (chronic obstructive pulmonary disease) (HCC)    Depression    Essential hypertension    Seizure disorder (HCC)    Staphylococcus aureus bacteremia 05/06/2019    Patient Active Problem List   Diagnosis Date Noted   Epistaxis 05/31/2019   COPD (chronic obstructive pulmonary disease) (HCC) 05/31/2019   Abscess of lung with pneumonia (HCC)    Aspiration pneumonia (HCC) 05/21/2019   Anemia of chronic disease 05/21/2019   Protein-calorie malnutrition, severe 05/10/2019   Pressure injury of skin 05/08/2019   Staphylococcus aureus bacteremia 05/06/2019   Severe sepsis (HCC) 05/02/2019   Acute on chronic respiratory failure with hypoxia (HCC) 05/02/2019   AKI (acute kidney injury) (HCC) 05/02/2019   Acute blood loss anemia 05/02/2019   Tachycardia 04/26/2019   AMS (altered mental status) 02/13/2019   UTI due to extended-spectrum beta lactamase (ESBL) producing Escherichia coli 02/12/2019   CAD (coronary artery disease) 02/12/2019   Essential hypertension 02/12/2019   Vitamin D  deficiency 02/12/2019   Seasonal allergies 02/12/2019   Muscle spasm/History of left BKA 02/12/2019   Insomnia 02/12/2019   COPD with acute exacerbation (HCC) 02/12/2019   Major depression, recurrent (HCC) 02/12/2019   Hypokalemia 02/12/2019   Constipation 02/12/2019   Seizure disorder (HCC) 02/12/2019    Past Surgical History:  Procedure Laterality Date   BELOW KNEE LEG AMPUTATION Left        No family history on file.  Social History   Tobacco Use   Smoking status: Former Smoker    Years: 20.00   Smokeless tobacco: Never Used  Substance Use Topics   Alcohol use: Not Currently   Drug use: Never    Home Medications Prior to Admission medications   Medication Sig Start Date End Date Taking? Authorizing Provider  albuterol (VENTOLIN HFA) 108 (90 Base) MCG/ACT inhaler Inhale 2 puffs into the lungs every 6 (six) hours as needed for shortness of breath (FOR Shortness of Breath  (S.O.B.)).    [provider]  divalproex (DEPAKOTE ER) 500 MG 24 hr tablet Take 1 tablet (500 mg total) by mouth at bedtime. 02/12/19   Medina-Vargas, Monina C, NP  docusate sodium (COLACE) 100 MG capsule Take 1 capsule (100 mg total) by mouth at bedtime. 02/12/19   Medina-Vargas, Monina C, NP  guaiFENesin (ROBITUSSIN) 100 MG/5ML liquid Take 200 mg by mouth 4 (four) times daily as needed for cough.    [provider]  Infant Care Products (DERMACLOUD) CREA Apply topically. Every shift    [provider]  ipratropium-albuterol (DUONEB) 0.5-2.5 (3) MG/3ML SOLN Take 3  mLs by nebulization every 6 (six) hours as needed (sob/wheezing).    [provider]  loratadine (CLARITIN) 10 MG tablet Take 1 tablet (10 mg total) by mouth daily. 02/12/19   Medina-Vargas, Monina C, NP  Melatonin 3 MG TABS Take 3 mg by mouth at bedtime as needed (sleep).     [provider]  Morphine Sulfate (MORPHINE CONCENTRATE) 10 MG/0.5ML SOLN concentrated solution Take 0.25 mLs (5  mg total) by mouth every 3 (three) hours as needed for severe pain or shortness of breath. 06/06/19   Regalado, Belkys A, MD  Multiple Vitamin (MULTIVITAMIN WITH MINERALS) TABS tablet Take 1 tablet by mouth daily. 05/11/19   Kathlen Mody, MD  Multiple Vitamins-Minerals (CEROVITE SENIOR PO) Take 1 tablet by mouth daily.    [provider]  oxymetazoline (AFRIN) 0.05 % nasal spray Place 1 spray into both nostrils once as needed for congestion (Bedside procedure). 06/06/19   Regalado, Belkys A, MD  pantoprazole (PROTONIX) 40 MG tablet Take 1 tablet (40 mg total) by mouth daily. 05/24/19   Enedina Finner, MD  sertraline (ZOLOFT) 50 MG tablet Take 25 mg by mouth daily.  04/27/19   [provider]  silver nitrate applicators 75-25 % applicator Apply 1 Stick topically once as needed for bleeding (Bedside procedure). 06/06/19   Regalado, Belkys A, MD  sodium chloride (OCEAN) 0.65 % SOLN nasal spray Place 1 spray into the nose 2 (two) times daily. 06/06/19   Regalado, Belkys A, MD  tiotropium (SPIRIVA HANDIHALER) 18 MCG inhalation capsule Place 1 capsule (18 mcg total) into inhaler and inhale daily. 02/12/19   Medina-Vargas, Monina C, NP  vitamin C (ASCORBIC ACID) 500 MG tablet Take 1 tablet (500 mg total) by mouth daily. 02/12/19   Medina-Vargas, Monina C, NP    Allergies    Patient has no known allergies.  Review of Systems   Review of Systems  Constitutional: Positive for chills, fatigue and fever. Negative for diaphoresis.  HENT: Positive for congestion. Negative for rhinorrhea.   Eyes: Negative for visual disturbance.  Respiratory: Positive for cough. Negative for chest tightness, shortness of breath and wheezing.   Cardiovascular: Negative for chest pain and leg swelling.  Gastrointestinal: Negative for abdominal pain, constipation, diarrhea, nausea and vomiting.  Genitourinary: Positive for dysuria. Negative for flank pain and frequency.  Musculoskeletal: Negative for back pain, neck  pain and neck stiffness.  Skin: Positive for wound.  Neurological: Negative for dizziness, light-headedness, numbness and headaches.  Psychiatric/Behavioral: Negative for agitation and confusion.  All other systems reviewed and are negative.   Physical Exam Updated Vital Signs BP 127/63    Pulse (!) 108    Temp 98.5 F (36.9 C) (Oral)    Resp (!) 33    SpO2 97%   Physical Exam Vitals and nursing note reviewed.  Constitutional:      General: He is not in acute distress.    Appearance: He is well-developed. He is ill-appearing. He is not toxic-appearing or diaphoretic.  HENT:     Head: Normocephalic and atraumatic.     Nose: No congestion or rhinorrhea.     Mouth/Throat:     Mouth: Mucous membranes are dry.     Pharynx: No oropharyngeal exudate or posterior oropharyngeal erythema.  Eyes:     Extraocular Movements: Extraocular movements intact.     Conjunctiva/sclera: Conjunctivae normal.     Pupils: Pupils are equal, round, and reactive to light.  Cardiovascular:     Rate and Rhythm: Regular rhythm.  Tachycardia present.     Pulses: Normal pulses.     Heart sounds: Murmur present.  Pulmonary:     Effort: Pulmonary effort is normal. No respiratory distress.     Breath sounds: Rhonchi present. No wheezing or rales.  Chest:     Chest wall: No tenderness.  Abdominal:     General: Abdomen is flat. There is no distension.     Palpations: Abdomen is soft.     Tenderness: There is no abdominal tenderness. There is no right CVA tenderness, left CVA tenderness, guarding or rebound.  Musculoskeletal:        General: No tenderness.     Cervical back: Neck supple. No tenderness.     Lumbar back: No tenderness.       Back:     Comments: Several right foot wounds that are well dressed and nontender.  No significant tracking erythema seen.  Patient has normal sensation and strength of the toes and I had a palpable DP pulse and PT pulse.  Skin:    General: Skin is warm and dry.      Capillary Refill: Capillary refill takes less than 2 seconds.     Findings: Erythema (mild around sacral wounds) present.  Neurological:     General: No focal deficit present.     Mental Status: He is alert.     Sensory: No sensory deficit.     Motor: No weakness.  Psychiatric:        Mood and Affect: Mood normal.     ED Results / Procedures / Treatments   Labs (all labs ordered are listed, but only abnormal results are displayed) Labs Reviewed  CBC WITH DIFFERENTIAL/PLATELET - Abnormal; Notable for the following components:      Result Value   RBC 2.99 (*)    Hemoglobin 7.2 (*)    HCT 24.1 (*)    MCH 24.1 (*)    MCHC 29.9 (*)    RDW 18.1 (*)    Platelets 482 (*)    Monocytes Absolute 1.1 (*)    All other components within normal limits  COMPREHENSIVE METABOLIC PANEL - Abnormal; Notable for the following components:   Glucose, Bld 177 (*)    BUN 37 (*)    Creatinine, Ser 1.85 (*)    Calcium 7.9 (*)    Total Protein 6.0 (*)    Albumin 2.0 (*)    GFR calc non Af Amer 34 (*)    GFR calc Af Amer 40 (*)    All other components within normal limits  LACTIC ACID, PLASMA - Abnormal; Notable for the following components:   Lactic Acid, Venous 2.7 (*)    All other components within normal limits  CULTURE, BLOOD (ROUTINE X 2)  CULTURE, BLOOD (ROUTINE X 2)  URINE CULTURE  LACTIC ACID, PLASMA  APTT  PROTIME-INR  URINALYSIS, ROUTINE W REFLEX MICROSCOPIC  PROTIME-INR  CORTISOL-AM, BLOOD  PROCALCITONIN  CBC  COMPREHENSIVE METABOLIC PANEL  POC OCCULT BLOOD, ED  TYPE AND SCREEN  PREPARE RBC (CROSSMATCH)  TYPE AND SCREEN    EKG None  Radiology DG Chest Port 1 View  Result Date: 07/23/2019 CLINICAL DATA:  Sepsis. EXAM: PORTABLE CHEST 1 VIEW COMPARISON:  June 04, 2019 FINDINGS: There are prominent interstitial lung markings bilaterally. There is a persistent hazy airspace opacity in the right mid lung zone. There is a small right-sided pleural effusion. The heart size is  stable. There is no pneumothorax. No acute osseous abnormality. IMPRESSION: 1. Persistent findings  of pneumonitis, especially on the right. 2. Persistent but improved right upper and right lower lobe consolidation. 3. Small bilateral pleural effusions, right greater than left. Electronically Signed   By: Katherine Mantlehristopher  Green M.D.   On: 07/23/2019 16:36    Procedures Procedures (including critical care time)  CRITICAL CARE Performed by: Canary Brimhristopher J Shaughn Thomley Total critical care time: 45 minutes Critical care time was exclusive of separately billable procedures and treating other patients. Critical care was necessary to treat or prevent imminent or life-threatening deterioration. Critical care was time spent personally by me on the following activities: development of treatment plan with patient and/or surrogate as well as nursing, discussions with consultants, evaluation of patient's response to treatment, examination of patient, obtaining history from patient or surrogate, ordering and performing treatments and interventions, ordering and review of laboratory studies, ordering and review of radiographic studies, pulse oximetry and re-evaluation of patient's condition.   Medications Ordered in ED Medications  0.9 %  sodium chloride infusion (10 mL/hr Intravenous Not Given 07/23/19 2336)  metroNIDAZOLE (FLAGYL) IVPB 500 mg (500 mg Intravenous New Bag/Given 07/23/19 2317)  HYDROmorphone (DILAUDID) tablet 1 mg (has no administration in time range)  mirtazapine (REMERON) tablet 7.5 mg (7.5 mg Oral Given 07/23/19 2241)  LORazepam (ATIVAN) tablet 0.5 mg (has no administration in time range)  bisacodyl (DULCOLAX) EC tablet 10 mg (has no administration in time range)  diphenhydrAMINE (BENADRYL) capsule 25 mg (has no administration in time range)  acetaminophen (TYLENOL) tablet 650 mg (has no administration in time range)  albuterol (PROVENTIL) (2.5 MG/3ML) 0.083% nebulizer solution 2.5 mg (has no  administration in time range)  alum & mag hydroxide-simeth (MAALOX/MYLANTA) 200-200-20 MG/5ML suspension 30 mL (has no administration in time range)  feeding supplement (PRO-STAT SUGAR FREE 64) liquid 30 mL (has no administration in time range)  cholecalciferol (VITAMIN D3) tablet 1,000 Units (has no administration in time range)  pantoprazole (PROTONIX) EC tablet 40 mg (has no administration in time range)  polyethylene glycol (MIRALAX / GLYCOLAX) packet 17 g (has no administration in time range)  ondansetron (ZOFRAN) injection 4 mg (has no administration in time range)  vancomycin (VANCOCIN) IVPB 1000 mg/200 mL premix (has no administration in time range)  ceFEPIme (MAXIPIME) 2 g in sodium chloride 0.9 % 100 mL IVPB (has no administration in time range)  feeding supplement (ENSURE ENLIVE) (ENSURE ENLIVE) liquid 237 mL (has no administration in time range)  0.9 %  sodium chloride infusion (Manually program via Guardrails IV Fluids) (has no administration in time range)  ceFEPIme (MAXIPIME) 2 g in sodium chloride 0.9 % 100 mL IVPB (0 g Intravenous Stopped 07/23/19 1827)  metroNIDAZOLE (FLAGYL) IVPB 500 mg (0 mg Intravenous Stopped 07/23/19 1714)  lactated ringers bolus 1,000 mL (0 mLs Intravenous Stopped 07/23/19 1710)    And  lactated ringers bolus 1,000 mL (0 mLs Intravenous Stopped 07/23/19 1827)  vancomycin (VANCOREADY) IVPB 1250 mg/250 mL (0 mg Intravenous Stopped 07/23/19 2020)    ED Course  I have reviewed the triage vital signs and the nursing notes.  Pertinent labs & imaging results that were available during my care of the patient were reviewed by me and considered in my medical decision making (see chart for details).    MDM Rules/Calculators/A&P                      Oleta MouseRalph Jarrard is a 79 y.o. 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  According to EMS, patient was visited by daughter today who was concerned about the patient was appearing she was turned about his sacral wounds and cough he is continued to have.  Patient says it has been coughing worse for the last few days and feeling bad.  He reports he is feeling very fatigued and tired and has had continued coughing.  He reports he is having no chest pain or new shortness of breath but is agreeing to coughing.  EMS found patient have a fever of 101.5 as well as tachycardia, tachypnea, and coughing.  Patient was activated as a code sepsis in the field.  Upon arrival, we continued code sepsis and will start broad-spectrum antibiotics.  Patient chart shows a MOST form that he wants to be treated for all infections ongoing.  He is otherwise denying constipation, diarrhea, but does report dysuria.  He is denying chest pain or abdominal pain.  He denies nausea and vomiting.  Patient was given Tylenol and fluids by EMS with some improvement in his fever on arrival.  On exam with a chaperone, patient does have several sacral area wounds that are somewhat erythematous and foul-smelling.  They are not tender.  Patient does have some feces near them.  Patient also had a bandaged right foot which appears to be nonerythematous and nontender.  Patient foot appears to be well managed.  Patient otherwise has a left BKA.  Abdomen and chest nontender.  Murmur appreciated.  Patient has rhonchus breath sounds bilaterally.  Patient is septic on vital signs on monitor evaluation and blood pressure is around 110 systolic.  We will continue code sepsis and I suspect patient has either UTI versus pneumonia source.  Possibly cellulitis on his sacrum however his foot does not appear to show new osteomyelitis.  Patient is not having any new pain in the foot.  Patient will have work-up to look for recurrent pneumonia or UTI and will admit after  work-up due to vital signs.  7:35 PM Chest had a long conversation with patient's daughter who is at the bedside.  We outlined the concerns we have with the AKI, downtrending hemoglobin, persistent tachycardia and tachypnea as well as concern for continued pneumonia, some cellulitis with his wounds, and possible UTI with his dysuria.  We are going to do in and out catheter to get the urine and I will do a fecal occult test given the downtrending hemoglobin.  His stool that I saw today did not appear dark but we will get the occult test on it.  Cannot do a fecal occult test due to the minilab being down due to power loss.  Due to sepsis and some degree of symptomatic anemia, will admit for further management.  Family agrees with plan of care.   Final Clinical Impression(s) / ED Diagnoses Final diagnoses:  Sepsis, due to unspecified organism, unspecified whether acute organ dysfunction present (HCC)  Symptomatic anemia    Clinical Impression: 1. Sepsis, due to unspecified organism, unspecified whether acute organ dysfunction present (HCC)   2. Symptomatic anemia     Disposition: Admit  This note was prepared with assistance of Dragon voice recognition software. Occasional wrong-word or sound-a-like substitutions may have occurred due to the inherent limitations of voice recognition software.      Rece Zechman, Canary Brim, MD 07/23/19 [phone]

## 2019-07-24 LAB — PROCALCITONIN: Procalcitonin: <0.1 ng/mL

## 2019-07-24 LAB — URINALYSIS, ROUTINE W REFLEX MICROSCOPIC
Bilirubin Urine: NEGATIVE
Glucose, UA: NEGATIVE mg/dL
Ketones, ur: NEGATIVE mg/dL
Leukocytes,Ua: NEGATIVE
Nitrite: NEGATIVE
Protein, ur: 100 mg/dL — AB
RBC / HPF: >50 RBC/hpf — ABNORMAL HIGH (ref 0–5)
Specific Gravity, Urine: 1.013 (ref 1.005–1.030)
pH: 5 (ref 5.0–8.0)

## 2019-07-24 LAB — CBC
HCT: 25.9 % — ABNORMAL LOW (ref 39.0–52.0)
Hemoglobin: 8.2 g/dL — ABNORMAL LOW (ref 13.0–17.0)
MCH: 25.4 pg — ABNORMAL LOW (ref 26.0–34.0)
MCHC: 31.7 g/dL (ref 30.0–36.0)
MCV: 80.2 fL (ref 80.0–100.0)
Platelets: 454 10*3/uL — ABNORMAL HIGH (ref 150–400)
RBC: 3.23 MIL/uL — ABNORMAL LOW (ref 4.22–5.81)
RDW: 17.8 % — ABNORMAL HIGH (ref 11.5–15.5)
WBC: 11.6 10*3/uL — ABNORMAL HIGH (ref 4.0–10.5)
nRBC: 0 % (ref 0.0–0.2)

## 2019-07-24 LAB — COMPREHENSIVE METABOLIC PANEL
ALT: 15 U/L (ref 0–44)
AST: 20 U/L (ref 15–41)
Albumin: 2.1 g/dL — ABNORMAL LOW (ref 3.5–5.0)
Alkaline Phosphatase: 50 U/L (ref 38–126)
Anion gap: 8 (ref 5–15)
BUN: 38 mg/dL — ABNORMAL HIGH (ref 8–23)
CO2: 25 mmol/L (ref 22–32)
Calcium: 7.9 mg/dL — ABNORMAL LOW (ref 8.9–10.3)
Chloride: 112 mmol/L — ABNORMAL HIGH (ref 98–111)
Creatinine, Ser: 1.7 mg/dL — ABNORMAL HIGH (ref 0.61–1.24)
GFR calc Af Amer: 44 mL/min — ABNORMAL LOW (ref >60)
GFR calc non Af Amer: 38 mL/min — ABNORMAL LOW (ref >60)
Glucose, Bld: 102 mg/dL — ABNORMAL HIGH (ref 70–99)
Potassium: 3.7 mmol/L (ref 3.5–5.1)
Sodium: 145 mmol/L (ref 135–145)
Total Bilirubin: 0.5 mg/dL (ref 0.3–1.2)
Total Protein: 6 g/dL — ABNORMAL LOW (ref 6.5–8.1)

## 2019-07-24 LAB — PROTIME-INR
INR: 1.2 (ref 0.8–1.2)
Prothrombin Time: 14.4 seconds (ref 11.4–15.2)

## 2019-07-24 LAB — MRSA PCR SCREENING: MRSA by PCR: POSITIVE — AB

## 2019-07-24 LAB — CORTISOL-AM, BLOOD: Cortisol - AM: 20.1 ug/dL (ref 6.7–22.6)

## 2019-07-24 MED ORDER — IPRATROPIUM-ALBUTEROL 0.5-2.5 (3) MG/3ML IN SOLN: 3.0000 mL | Freq: Four times a day (QID) | RESPIRATORY_TRACT | Status: DC | PRN

## 2019-07-24 MED ORDER — CHLORHEXIDINE GLUCONATE CLOTH 2 % EX PADS
6.0000 | MEDICATED_PAD | Freq: Every day | CUTANEOUS | Status: DC
  Administered 2019-07-25 – 2019-07-27 (×4): 6 via TOPICAL

## 2019-07-24 MED ORDER — MUPIROCIN 2 % EX OINT
1.0000 "application " | TOPICAL_OINTMENT | Freq: Two times a day (BID) | CUTANEOUS | Status: DC
  Administered 2019-07-24 – 2019-07-27 (×7): 1 via NASAL
  Filled 2019-07-24 (×2): qty 22

## 2019-07-24 MED ORDER — ORAL CARE MOUTH RINSE
15.0000 mL | Freq: Two times a day (BID) | OROMUCOSAL | Status: DC
  Administered 2019-07-24 – 2019-07-27 (×6): 15 mL via OROMUCOSAL

## 2019-07-24 MED ORDER — COLLAGENASE 250 UNIT/GM EX OINT
TOPICAL_OINTMENT | Freq: Every day | CUTANEOUS | Status: DC
  Filled 2019-07-24: qty 30

## 2019-07-24 NOTE — Progress Notes (Addendum)
PROGRESS NOTE    Paul Suarez  UUV:[id number] DOB: [birth year] DOA: 07/23/2019 PCP: Hendricks Limes, MD   Brief Narrative:  Patient is a 79 year old male with history of COPD, hypertension, coronary artery disease, on home oxygen, history of ESBL UTI, Covid illness in February, lung abscess, aspiration syndrome, hiatal hernia who is currently under hospice care who and was brought to the emergency department from skilled nursing facility for the evaluation of worsening cough, fever, tachycardia, tachypnea.  Symptoms started 3 days ago and gradually worsening.  Also history of congestion, chills, dysuria.  He has been admitted several times recently.  He was admitted in February for ESBL UTI and was admitted in late March for aspiration pneumonia and was ultimately discharged with palliative care follow-up.  Patient also has sacral decubitus, lateral right foot decubitus.  Chest x-ray showed persistent pneumonitis, right upper and lower lobe consolidation.  Started on broad-spectrum antibiotics, culture sent.  Palliative care consulted  Assessment & Plan:   Principal Problem:   Sepsis ([place]) Active Problems:   Essential hypertension   AKI (acute kidney injury) ([place])   Decubitus ulcers   Aspiration pneumonia (HCC)   Anemia of chronic disease   COPD (chronic obstructive pulmonary disease) (HCC)   Sepsis, present on admission: Most likely secondary to recurrent aspiration pneumonia causing pneumonitis.  His aspiration pneumonia in the past was attributed to hiatal hernia.  Chest x-ray showed persistent pneumonitis, right upper and lower lobe consolidation.  Also has decubitus ulcers.  Started on broad spectrum antibiotics.Continue aspiration precaution.  Follow-up cultures.  Patient on comfort feeding, not restricting diet at this time.  Palliaitive care consulted. He has history of ESBL UTI.  Follow-up urine cultures. Negative procalcitonin.  Continue current antibiotics. He was febrile on  presentation, currently afebrile and hemodynamically stable.  Aspiration pneumonia: We have requested  for speech therapy evaluation.  Chest x-ray finding as above.  Currently on 5 L of oxygen per minute, this is his baseline.  He was not in any kind of respiratory distress this morning.  AKI: Most likely prerenal secondary to decreased oral intake, dehydration, continue IV fluids.  Monitor BMP.  Avoid nephrotoxins  Normocytic anemia: Chronic, associated with chronic medical condition.  No active blood loss.  Being transfused with 1 unit of PRBC.  Monitor CBC.  His hemoglobin ranges from 7-9.  COPD: On home oxygen.  Currently not in exacerbation.  Continue bronchodilators  Decubitus ulcers: Present on admission.  Unstageable. wound care consulted.  Continue antibiotics.  Follow-up wound cultures.  Hypertension: Currently blood pressure medicines on hold.  Blood pressure stable  Goals of care: DNR.  Patient is being followed by hospice as an outpatient.  We have consulted palliative care for goals of care discussion .           DVT prophylaxis:SCD Code Status: DNR Family Communication: Called and discussed with daughter on phone on 07/24/2019. Status is: Inpatient  Remains inpatient appropriate because:Hemodynamically unstable   Dispo: The patient is from: SNF              Anticipated d/c is to: SNF              Anticipated d/c date is: 2-3 days              Patient currently is not medically stable to d/c.  Needs IV antibiotics    Consultants: None  Procedures:None  Antimicrobials:  Anti-infectives (From admission, onward)   Start     Dose/Rate Route Frequency  Ordered Stop   07/24/19 1800  vancomycin (VANCOCIN) IVPB 1000 mg/200 mL premix     1,000 mg 200 mL/hr over 60 Minutes Intravenous Every 24 hours 07/23/19 2020     07/24/19 0600  ceFEPIme (MAXIPIME) 2 g in sodium chloride 0.9 % 100 mL IVPB     2 g 200 mL/hr over 30 Minutes Intravenous Every 12 hours 07/23/19  2024     07/24/19 0000  metroNIDAZOLE (FLAGYL) IVPB 500 mg     500 mg 100 mL/hr over 60 Minutes Intravenous Every 8 hours 07/23/19 2006     07/23/19 1615  vancomycin (VANCOREADY) IVPB 1250 mg/250 mL     1,250 mg 166.7 mL/hr over 90 Minutes Intravenous  Once 07/23/19 1600 07/23/19 2020   07/23/19 1600  ceFEPIme (MAXIPIME) 2 g in sodium chloride 0.9 % 100 mL IVPB     2 g 200 mL/hr over 30 Minutes Intravenous  Once 07/23/19 1556 07/23/19 1827   07/23/19 1600  metroNIDAZOLE (FLAGYL) IVPB 500 mg     500 mg 100 mL/hr over 60 Minutes Intravenous  Once 07/23/19 1556 07/23/19 1714   07/23/19 1600  vancomycin (VANCOCIN) IVPB 1000 mg/200 mL premix  Status:  Discontinued     1,000 mg 200 mL/hr over 60 Minutes Intravenous  Once 07/23/19 1556 07/23/19 1600      Subjective: Patient seen and examined at the bedside this morning.  Hemodynamically stable.  Afebrile.  He looked comfortable during my evaluation, and said he feels better and wants to eat food.  He was not in any respiratory distress.  On 5 L of oxygen which is his baseline.  Objective: Vitals:   07/24/19 0300 07/24/19 0400 07/24/19 0500 07/24/19 0600  BP: 133/63 (!) 145/68 (!) 142/59 (!) 144/58  Pulse: 100 (!) 104 (!) 106 99  Resp: (!) 38 (!) 45 (!) 22 (!) 30  Temp: 98.1 F (36.7 C)     TempSrc: Oral     SpO2: 92% 93% (!) 87% 94%    Intake/Output Summary (Last 24 hours) at 07/24/2019 0735 Last data filed at 07/24/2019 0500 Gross per 24 hour  Intake 589 ml  Output 400 ml  Net 189 ml   There were no vitals filed for this visit.  Examination:  General exam: Extremely deconditioned, debilitated elderly male, chronically looking Respiratory system: Decreased air entry in crackles on the right lung  cardiovascular system: S1 & S2 heard, RRR. No JVD, murmurs, rubs, gallops or clicks. Gastrointestinal system: Abdomen is nondistended, soft and nontender. No organomegaly or masses felt. Normal bowel sounds heard. Central nervous  system: Alert and awake, oriented to place only Extremities: No edema, no clubbing ,no cyanosis, left BKA  skin: Unstageable pressure ulcers on the buttocks, sacrum, right foot ankle/heel    Data Reviewed: I have personally reviewed following labs and imaging studies  CBC: Recent Labs  Lab 07/23/19 1524 07/24/19 0544  WBC 10.4 11.6*  NEUTROABS 7.3  --   HGB 7.2* 8.2*  HCT 24.1* 25.9*  MCV 80.6 80.2  PLT 482* 454*   Basic Metabolic Panel: Recent Labs  Lab 07/23/19 1524 07/24/19 0544  NA 145 145  K 4.1 3.7  CL 109 112*  CO2 25 25  GLUCOSE 177* 102*  BUN 37* 38*  CREATININE 1.85* 1.70*  CALCIUM 7.9* 7.9*   GFR: Estimated Creatinine Clearance: 32.9 mL/min (A) (by C-G formula based on SCr of 1.7 mg/dL (H)). Liver Function Tests: Recent Labs  Lab 07/23/19 1524 07/24/19 0544  AST 22  20  ALT 14 15  ALKPHOS 57 50  BILITOT 0.4 0.5  PROT 6.0* 6.0*  ALBUMIN 2.0* 2.1*   No results for input(s): LIPASE, AMYLASE in the last 168 hours. No results for input(s): AMMONIA in the last 168 hours. Coagulation Profile: Recent Labs  Lab 07/23/19 1520 07/24/19 0544  INR 1.1 1.2   Cardiac Enzymes: No results for input(s): CKTOTAL, CKMB, CKMBINDEX, TROPONINI in the last 168 hours. BNP (last 3 results) No results for input(s): PROBNP in the last 8760 hours. HbA1C: No results for input(s): HGBA1C in the last 72 hours. CBG: No results for input(s): GLUCAP in the last 168 hours. Lipid Profile: No results for input(s): CHOL, HDL, LDLCALC, TRIG, CHOLHDL, LDLDIRECT in the last 72 hours. Thyroid Function Tests: No results for input(s): TSH, T4TOTAL, FREET4, T3FREE, THYROIDAB in the last 72 hours. Anemia Panel: No results for input(s): VITAMINB12, FOLATE, FERRITIN, TIBC, IRON, RETICCTPCT in the last 72 hours. Sepsis Labs: Recent Labs  Lab 07/23/19 1520 07/23/19 1730  LATICACIDVEN 2.7* 0.7    Recent Results (from the past 240 hour(s))  Blood Culture (routine x 2)     Status:  None (Preliminary result)   Collection Time: 07/23/19  3:20 PM   Specimen: BLOOD LEFT HAND  Result Value Ref Range Status   Specimen Description   Final    BLOOD LEFT HAND Performed at [facility], [address], [place], [place] [address]    Special Requests   Final    BOTTLES DRAWN AEROBIC AND ANAEROBIC Blood Culture adequate volume Performed at [facility], [address], [place], [place] [address]    Culture   Final    NO GROWTH < 12 HOURS Performed at [facility], [address], [place], [place] [address]    Report Status PENDING  Incomplete  Blood Culture (routine x 2)     Status: None (Preliminary result)   Collection Time: 07/23/19  4:15 PM   Specimen: BLOOD RIGHT HAND  Result Value Ref Range Status   Specimen Description   Final    BLOOD RIGHT HAND Performed at [facility], [address] [place], [place] [address]    Special Requests   Final    BOTTLES DRAWN AEROBIC AND ANAEROBIC Blood Culture adequate volume Performed at [facility], [address] [place], [place] [address]    Culture   Final    NO GROWTH < 12 HOURS Performed at [facility], [address] [place], [place] [address]    Report Status PENDING  Incomplete         Radiology Studies: DG Chest Port 1 View  Result Date: 07/23/2019 CLINICAL DATA:  Sepsis. EXAM: PORTABLE CHEST 1 VIEW COMPARISON:  June 04, 2019 FINDINGS: There are prominent interstitial lung markings bilaterally. There is a persistent hazy airspace opacity in the right mid lung zone. There is a small right-sided pleural effusion. The heart size is stable. There is no pneumothorax. No acute osseous abnormality. IMPRESSION: 1. Persistent findings of pneumonitis, especially on the right. 2. Persistent but improved right upper and right lower lobe consolidation. 3. Small bilateral pleural effusions, right greater than left. Electronically  Signed   By: Katherine Mantle M.D.   On: 07/23/2019 16:36        Scheduled Meds: . sodium chloride   Intravenous Once  . cholecalciferol  1,000 Units Oral Daily  . feeding supplement (ENSURE ENLIVE)  237 mL Oral BID BM  .  feeding supplement (PRO-STAT SUGAR FREE 64)  30 mL Oral BID  . mirtazapine  7.5 mg Oral QHS  . pantoprazole  40 mg Oral Daily  . polyethylene glycol  17 g Oral Daily   Continuous Infusions: . sodium chloride    . ceFEPime (MAXIPIME) IV 2 g (07/24/19 0640)  . metronidazole 500 mg (07/23/19 2317)  . vancomycin       LOS: 1 day    Time spent: 35 mins.More than 50% of that time was spent in counseling and/or coordination of care.      Burnadette Pop, MD [facility] P5/01/2020, 7:35 AM

## 2019-07-24 NOTE — Consult Note (Addendum)
WOC Nurse Consult Note: Reason for Consult: Consult requested for multiple pressure injuries. Pt is very emeciated and immobile and slightly contracted and it is difficult to reduce pressure to several locations. He is frequently incontinent of loose stools and it is difficult to keep wounds from becoming soiled. All are unstageable pressure injuries. Pressure Injury POA: Yes Right ischium 3X1cm in patchy areas, 100% slough, small amt tan drainage Right lower ischium 4X5cm in patchy areas, 100% slough, small amt tan drainage Sacrum 7X4cm in patchy areas, 100% slough, small amt tan drainage Left ischium 2X1cm, 100% slough, small amt tan drainage Right outer ankle 2X1cm, 100% slough, small amt tan drainage Right heel 2X2cm, 100% slough, small amt tan drainage Right outer foot, 3X2cm, 1X1cm, 3X3cm, 100% slough, small amt tan drainage Dressing procedure/placement/frequency: Pt is on a low airloss mattress to reduce pressure. Float heels to reduce pressure. Topical treatment orders provided for bedside nurses to perform daily as follows to assist with enzymatic debridement: Apply Santyl to following locations Q day: Sacrum, right heel, right ankle, right outer foot (3 sites) bilat ischium.  Then cover with moist gauze and foam dressings.  (Change foam dressings Q 3 days or PRN soiling.) Please re-consult if further assistance is needed.  Thank-you,  Cammie Mcgee MSN, RN, CWOCN, [place], CNS [phone]

## 2019-07-24 NOTE — Evaluation (Signed)
Clinical/Bedside Swallow Evaluation Patient Details  Name: Paul Suarez MRN: [id number] Date of Birth: 09-25-1940  Today's Date: 07/24/2019 Time: SLP Start Time (ACUTE ONLY): 1325 SLP Stop Time (ACUTE ONLY): 1340 SLP Time Calculation (min) (ACUTE ONLY): 15 min  Past Medical History:  Past Medical History:  Diagnosis Date  . CAD (coronary artery disease)   . COPD (chronic obstructive pulmonary disease) (HCC)   . Depression   . Essential hypertension   . Seizure disorder (HCC)   . Staphylococcus aureus bacteremia 05/06/2019   Past Surgical History:  Past Surgical History:  Procedure Laterality Date  . BELOW KNEE LEG AMPUTATION Left    HPI:  Pt is a 79 y.o. gentleman with several recent hospitalization for pna, chronic dysphagia with likely chronic aspiration now admit with sepsis.  Pt CXR during this admit showed Persistent findings of pneumonitis, especially on the right.2. Persistent but improved right upper and right lower lobe consolidation.3. Small bilateral pleural effusions, right greater than left.   PMH + for history of dysphagia, CAD and PVD s/p BKA, Severe centrilobular emphysema on home oxygen 2LNC, who was previously diagnosed with COVID 19 pneumonia. Has a history of recurrent resistant ESBL producing organisms. Patient has undergone multiple hospital admits in the last 4 months - and last was discharged to [facility].  secondary to pneumonia.  CT of CHEST 05/21/2019: "CT scan of the chest which showed Large hiatal hernia with diffuse esophageal thickening potentially related to esophagitis aspiration pna, post infectous absess.  Given thickening in esophagus, ? if he may also have some esophageal dysmotlity for which compensation were reviewed during prior hospital admit.  Previous Swallow Assessment: BSE 06/04/2019; 05/22/2019, BSE 05/07/2019;; MBSS 05/08/2019; DtrR reports hx of puree diet with weight loss in the past and pt has been on a soft diet at his SNF.  Pt being treated by wound  care staffing.   Assessment / Plan / Recommendation Clinical Impression  Pt known to this SLP from prior hospital stay.  Respiratory status is improved compared to last visit when pt required facemask and did not want to eat. Today pt is reporting he is hungry and requesting to go get food.  No overt indication of aspiration with intake of water, applesauce and graham cracker.  Swallowing overall appeared swift with no coughing.  Pt only took a few bites before stating "I don't want that" thus evaluation was limited.  However he is known to this SLP and during prior visit SLP informed pt and daughter to dysphagia mitigation strategies.    Primary aspiration risk appears to be esophageal given his hx and large hiatal hernia.  SLP implemented same swallow precautions that were in place from prior evaluation and created swallow precaution signs.  Pt chronic aspiration mitigation strategies in place.  No SLP follow up indicated.  Thanks for this consult. SLP Visit Diagnosis: Dysphagia, unspecified (R13.10)(suspect esophageal dysmotility based on prior imaging)    Aspiration Risk  Mild aspiration risk    Diet Recommendation Thin liquid;Regular;Dysphagia 3 (Mech soft)(softer foods only')   Liquid Administration via: Cup;Straw Medication Administration: Whole meds with puree Compensations: Slow rate;Small sips/bites(start meals with liquids) Postural Changes: Seated upright at 90 degrees;Remain upright for at least 30 minutes after po intake    Other  Recommendations Oral Care Recommendations: Oral care BID   Follow up Recommendations None      Frequency and Duration     no follow up       Prognosis   n/a  Swallow Study   General Date of Onset: 07/24/19 HPI: Pt is a 79 y.o. gentleman with several recent hospitalization for pna, chronic dysphagia with likely chronic aspiration now admit with sepsis.  Pt CXR during this admit showed Persistent findings of pneumonitis, especially on the  right.2. Persistent but improved right upper and right lower lobe consolidation.3. Small bilateral pleural effusions, right greater than left.   PMH + for history of dysphagia, CAD and PVD s/p BKA, Severe centrilobular emphysema on home oxygen 2LNC, who was previously diagnosed with COVID 19 pneumonia. Has a history of recurrent resistant ESBL producing organisms. Patient has undergone multiple hospital admits in the last 4 months - and last was discharged to [facility].  secondary to pneumonia.  CT of CHEST 05/21/2019: "CT scan of the chest which showed Large hiatal hernia with diffuse esophageal thickening potentially related to esophagitis aspiration pna, post infectous absess.  Given thickening in esophagus, ? if he may also have some esophageal dysmotlity for which compensation were reviewed during prior hospital admit.  Previous Swallow Assessment: BSE 06/04/2019; 05/22/2019, BSE 05/07/2019;; MBSS 05/08/2019; DtrR reports hx of puree diet with weight loss in the past and pt has been on a soft diet at his SNF.  Pt being treated by wound care staffing. Type of Study: Bedside Swallow Evaluation Previous Swallow Assessment: see HPI Diet Prior to this Study: Regular;Thin liquids Temperature Spikes Noted: No Respiratory Status: Nasal cannula History of Recent Intubation: No Behavior/Cognition: Alert;Cooperative;Pleasant mood Oral Cavity Assessment: Dry Oral Care Completed by SLP: No Oral Cavity - Dentition: Edentulous Vision: Functional for self-feeding Self-Feeding Abilities: Able to feed self Patient Positioning: Upright in bed Baseline Vocal Quality: Low vocal intensity Volitional Cough: Weak    Oral/Motor/Sensory Function Overall Oral Motor/Sensory Function: Generalized oral weakness   Ice Chips Ice chips: Not tested   Thin Liquid Thin Liquid: Within functional limits Presentation: Straw    Nectar Thick Nectar Thick Liquid: Not tested   Honey Thick Honey Thick Liquid: Not tested   Puree Puree:  Within functional limits Presentation: Spoon   Solid     Solid: Within functional limits Other Comments: clinical appearance of slow mastication but no oral pocketing      Macario Golds 07/24/2019,1:51 PM Kathleen Lime, MS [place] Office [phone]

## 2019-07-24 NOTE — Progress Notes (Signed)
Palliative Medicine RN Note: Consult order noted.  Patient is actively admitted to outpatient palliative care services with [facility] ([facility] I spoke with their liaison, who will reach out to his daughter.   PMT will engage with Paul Suarez and his family as soon as we have an available provider. In the interim, he does have a completed MOST form in the [facility] tab.  Paul Chance Alphons Burgert, RN, BSN, [facility] Palliative Medicine Team 07/24/2019 12:56 PM Office [phone]

## 2019-07-24 NOTE — TOC Initial Note (Signed)
Transition of Care [facility]) - Initial/Assessment Note    Patient Details  Name: Paul Suarez MRN: [id number] Date of Birth: 1940/08/10  Transition of [facility]) CM/SW Contact:    Leeroy Cha, RN Phone Number: 07/24/2019, 10:08 AM  Clinical Narrative:                 Admitted with sepsis has pcp/ is from [facility]/O2 at 5l/min via [place], iv abx x3, urine and bld cultures pending, wbc 11.6, hgb s/p 1 unit of prbc -8.2 from 7.2.  BUN 38 and creat 1.70.   Expected Discharge Plan: Home/Self Care Barriers to Discharge: Continued Medical Work up   Patient Goals and CMS Choice Patient states their goals for this hospitalization and ongoing recovery are:: to go back home CMS Medicare.gov Compare Post Acute Care list provided to:: Patient    Expected Discharge Plan and Services Expected Discharge Plan: Home/Self Care   Discharge Planning Services: CM Consult   Living arrangements for the past 2 months: Single Family Home                                      Prior Living Arrangements/Services Living arrangements for the past 2 months: Single Family Home Lives with:: Spouse Patient language and need for interpreter reviewed:: No Do you feel safe going back to the place where you live?: Yes      Need for Family Participation in Patient Care: Yes (Comment) Care giver support system in place?: Yes (comment)   Criminal Activity/Legal Involvement Pertinent to Current Situation/Hospitalization: No - Comment as needed  Activities of Daily Living Home Assistive Devices/Equipment: Shower chair with back, Wheelchair ADL Screening (condition at time of admission) Patient's cognitive ability adequate to safely complete daily activities?: Yes Is the patient deaf or have difficulty hearing?: No Does the patient have difficulty seeing, even when wearing glasses/contacts?: No Does the patient have difficulty concentrating, remembering, or making decisions?: Yes Patient able to express  need for assistance with ADLs?: Yes Does the patient have difficulty dressing or bathing?: Yes Independently performs ADLs?: No Communication: Independent Is this a change from baseline?: Pre-admission baseline Dressing (OT): Needs assistance Is this a change from baseline?: Pre-admission baseline Grooming: Needs assistance Is this a change from baseline?: Pre-admission baseline Bathing: Needs assistance Is this a change from baseline?: Pre-admission baseline Toileting: Needs assistance Is this a change from baseline?: Pre-admission baseline In/Out Bed: Needs assistance Is this a change from baseline?: Pre-admission baseline Walks in Home: Dependent Is this a change from baseline?: Pre-admission baseline Does the patient have difficulty walking or climbing stairs?: Yes Weakness of Legs: Both(pt has had one leg amputated) Weakness of Arms/Hands: Both  Permission Sought/Granted                  Emotional Assessment Appearance:: Appears stated age     Orientation: : Oriented to Self, Oriented to Place, Oriented to  Time, Oriented to Situation   Psych Involvement: No (comment)  Admission diagnosis:  Sepsis ([place]) [A41.9] Patient Active Problem List   Diagnosis Date Noted  . Sepsis ([place]) 07/23/2019  . Epistaxis 05/31/2019  . COPD (chronic obstructive pulmonary disease) ([place]) 05/31/2019  . Abscess of lung with pneumonia ([place])   . Aspiration pneumonia ([place]) 05/21/2019  . Anemia of chronic disease 05/21/2019  . Protein-calorie malnutrition, severe 05/10/2019  . Decubitus ulcers 05/08/2019  . Staphylococcus aureus bacteremia 05/06/2019  . Severe sepsis (  HCC) 05/02/2019  . Acute on chronic respiratory failure with hypoxia (HCC) 05/02/2019  . AKI (acute kidney injury) (HCC) 05/02/2019  . Acute blood loss anemia 05/02/2019  . Tachycardia 04/26/2019  . AMS (altered mental status) 02/13/2019  . UTI due to extended-spectrum beta lactamase (ESBL) producing Escherichia coli  02/12/2019  . CAD (coronary artery disease) 02/12/2019  . Essential hypertension 02/12/2019  . Vitamin D deficiency 02/12/2019  . Seasonal allergies 02/12/2019  . Muscle spasm/History of left BKA 02/12/2019  . Insomnia 02/12/2019  . COPD with acute exacerbation (HCC) 02/12/2019  . Major depression, recurrent (HCC) 02/12/2019  . Hypokalemia 02/12/2019  . Constipation 02/12/2019  . Seizure disorder (HCC) 02/12/2019   PCP:  Pecola Lawless, MD Pharmacy:   [facility] [phone] [place], [place] - [address] AT [facility] [address] [facility] [address] [place] [address] Phone: [phone] Fax: [phone]     Social Determinants of Health (SDOH) Interventions    Readmission Risk Interventions Readmission Risk Prevention Plan 05/22/2019  Transportation Screening Complete  PCP or Specialist Appt within 3-5 Days Complete  HRI or Home Care Consult Complete  Social Work Consult for Recovery Care Planning/Counseling Complete  Palliative Care Screening Complete  Medication Review Oceanographer) Referral to Pharmacy

## 2019-07-24 NOTE — Progress Notes (Signed)
[facility])  Paul Suarez is our current Palliative patient with [facility]. Our team will follow Paul Suarez during his hospitalization and help with any discharge needs.  Please feel free to contact us with any concerns or questions.  Yolande Jolly, RN, [facility] (in [place]) [employer]  [phone]

## 2019-07-24 NOTE — Progress Notes (Signed)
Initial Nutrition Assessment  DOCUMENTATION CODES:   Non-severe (moderate) malnutrition in context of chronic illness  INTERVENTION:  - will monitor for recommendation from SLP.    NUTRITION DIAGNOSIS:   Moderate Malnutrition related to chronic illness as evidenced by moderate fat depletion, moderate muscle depletion, severe muscle depletion.  GOAL:   Patient will meet greater than or equal to 90% of their needs  MONITOR:   Diet advancement, Labs, Weight trends  REASON FOR ASSESSMENT:   Malnutrition Screening Tool  ASSESSMENT:   79 year old male with history of COPD, HTN, CAD, on home O2, hx of ESBL UTI, COVID-19 in February, lung abscess, aspiration syndrome, hiatal hernia. He is currently under hospice care. He presented to the ED from SNF for evaluation of worsening cough, fever, tachycardia, and tachypnea. CXR showed persistent PNA.  Able to talk with RN about patient. He is currently NPO pending SLP evaluation. Patient laying in bed; his daughter was visiting earlier but left prior to RD visit. Patient reports being hungry, denies abdominal discomfort or any pain at this time. He is R-handed but his daughter or staff at SNF typically help him with feeding during meals. Unable to obtain any other information from patient at this time.   Per chart review, weight today is 133 lb and weight on 3/22 was 137 lb. This indicates 4 lb weight loss (3% body weight) in the past 1.5 months; not significant for time frame.   Per notes: - sepsis - aspiration PNA - AKI - DNR and followed by Hospice outpatient   Labs reviewed; Cl: 112 mmol/l, BUN: 38 mg/dl, creatinine: 1.7 mg/dl, Ca: 7.9 mg/dl, GFR: 38 ml/min. Medications reviewed; 1000 units vitamin D3/day, 40 mg oral protonix/dday, 1 packet miralax/day.    NUTRITION - FOCUSED PHYSICAL EXAM:    Most Recent Value  Orbital Region  Moderate depletion  Upper Arm Region  Moderate depletion  Thoracic and Lumbar Region  Unable to  assess  Buccal Region  Mild depletion  Temple Region  Mild depletion  Clavicle Bone Region  Severe depletion  Clavicle and Acromion Bone Region  Severe depletion  Scapular Bone Region  Severe depletion  Dorsal Hand  Moderate depletion  Patellar Region  No depletion  Anterior Thigh Region  Mild depletion  Posterior Calf Region  Unable to assess  Edema (RD Assessment)  None  Hair  Reviewed  Eyes  Reviewed  Mouth  Reviewed  Skin  Reviewed  Nails  Reviewed       Diet Order:   Diet Order            Diet Heart Room service appropriate? Yes; Fluid consistency: Thin  Diet effective now              EDUCATION NEEDS:   No education needs have been identified at this time  Skin:  Skin Assessment: Skin Integrity Issues: Skin Integrity Issues:: Unstageable Unstageable: full thickness to bilateral ITs, R ankle, R heel, R foot, and sacrum  Last BM:  5/10  Height:   Ht Readings from Last 1 Encounters:  07/24/19 5\' 8"  (1.727 m)    Weight:   Wt Readings from Last 1 Encounters:  07/24/19 60.5 kg    Ideal Body Weight:  70 kg  BMI:  Body mass index is 20.28 kg/m.  Estimated Nutritional Needs:   Kcal:  1995-2180 kcal  Protein:  100-115 grams  Fluid:  >/= 2 L/day     09/23/19, MS, RD, LDN, CNSC Inpatient Clinical Dietitian RD pager #  available in AMION  After hours/weekend pager # available in AMION  

## 2019-07-25 LAB — CBC WITH DIFFERENTIAL/PLATELET
Abs Immature Granulocytes: 0.04 10*3/uL (ref 0.00–0.07)
Basophils Absolute: 0.1 10*3/uL (ref 0.0–0.1)
Basophils Relative: 0 %
Eosinophils Absolute: 0.3 10*3/uL (ref 0.0–0.5)
Eosinophils Relative: 2 %
HCT: 25.4 % — ABNORMAL LOW (ref 39.0–52.0)
Hemoglobin: 8 g/dL — ABNORMAL LOW (ref 13.0–17.0)
Immature Granulocytes: 0 %
Lymphocytes Relative: 16 %
Lymphs Abs: 1.8 10*3/uL (ref 0.7–4.0)
MCH: 25.3 pg — ABNORMAL LOW (ref 26.0–34.0)
MCHC: 31.5 g/dL (ref 30.0–36.0)
MCV: 80.4 fL (ref 80.0–100.0)
Monocytes Absolute: 1.2 10*3/uL — ABNORMAL HIGH (ref 0.1–1.0)
Monocytes Relative: 11 %
Neutro Abs: 8 10*3/uL — ABNORMAL HIGH (ref 1.7–7.7)
Neutrophils Relative %: 71 %
Platelets: 404 10*3/uL — ABNORMAL HIGH (ref 150–400)
RBC: 3.16 MIL/uL — ABNORMAL LOW (ref 4.22–5.81)
RDW: 18.3 % — ABNORMAL HIGH (ref 11.5–15.5)
WBC: 11.4 10*3/uL — ABNORMAL HIGH (ref 4.0–10.5)
nRBC: 0 % (ref 0.0–0.2)

## 2019-07-25 LAB — TYPE AND SCREEN
ABO/RH(D): A POS
Antibody Screen: NEGATIVE
Unit division: 0

## 2019-07-25 LAB — BPAM RBC
Blood Product Expiration Date: 202106032359
ISSUE DATE / TIME: 202105110052
Unit Type and Rh: 6200

## 2019-07-25 LAB — IRON AND TIBC
Iron: 14 ug/dL — ABNORMAL LOW (ref 45–182)
Saturation Ratios: 8 % — ABNORMAL LOW (ref 17.9–39.5)
TIBC: 185 ug/dL — ABNORMAL LOW (ref 250–450)
UIBC: 171 ug/dL

## 2019-07-25 LAB — BASIC METABOLIC PANEL
Anion gap: 10 (ref 5–15)
BUN: 36 mg/dL — ABNORMAL HIGH (ref 8–23)
CO2: 23 mmol/L (ref 22–32)
Calcium: 7.4 mg/dL — ABNORMAL LOW (ref 8.9–10.3)
Chloride: 106 mmol/L (ref 98–111)
Creatinine, Ser: 1.65 mg/dL — ABNORMAL HIGH (ref 0.61–1.24)
GFR calc Af Amer: 45 mL/min — ABNORMAL LOW (ref >60)
GFR calc non Af Amer: 39 mL/min — ABNORMAL LOW (ref >60)
Glucose, Bld: 110 mg/dL — ABNORMAL HIGH (ref 70–99)
Potassium: 3.9 mmol/L (ref 3.5–5.1)
Sodium: 139 mmol/L (ref 135–145)

## 2019-07-25 LAB — FERRITIN: Ferritin: 118 ng/mL (ref 24–336)

## 2019-07-25 MED ORDER — AMLODIPINE BESYLATE 5 MG PO TABS
5.0000 mg | ORAL_TABLET | Freq: Every day | ORAL | Status: DC
  Administered 2019-07-25 – 2019-07-27 (×3): 5 mg via ORAL
  Filled 2019-07-25 (×3): qty 1

## 2019-07-25 MED ORDER — SODIUM CHLORIDE 0.9 % IV SOLN
510.0000 mg | Freq: Once | INTRAVENOUS | Status: AC
  Administered 2019-07-25: 510 mg via INTRAVENOUS
  Filled 2019-07-25: qty 510

## 2019-07-25 MED ORDER — SODIUM CHLORIDE 0.9 % IV SOLN: INTRAVENOUS | Status: DC

## 2019-07-25 NOTE — Progress Notes (Signed)
PROGRESS NOTE    Paul Suarez  ZOX:[id number] DOB: [birth year] DOA: 07/23/2019 PCP: Pecola Lawless, MD   Brief Narrative:  Patient is a 79 year old male with history of COPD, hypertension, coronary artery disease, on home oxygen, history of ESBL UTI, Covid illness in February, lung abscess, aspiration syndrome, hiatal hernia who is currently under hospice care who and was brought to the emergency department from skilled nursing facility for the evaluation of worsening cough, fever, tachycardia, tachypnea.  Symptoms started 3 days ago and gradually worsening.  Also history of congestion, chills, dysuria.  He has been admitted several times recently.  He was admitted in February for ESBL UTI and was admitted in late March for aspiration pneumonia and was ultimately discharged with palliative care follow-up.  Patient also has sacral decubitus, lateral right foot decubitus.  Chest x-ray showed persistent pneumonitis, right upper and lower lobe consolidation.  Started on broad-spectrum antibiotics, culture sent.  Palliative care also consulted for goals of care discussion.  Assessment & Plan:   Principal Problem:   Sepsis (HCC) Active Problems:   Essential hypertension   AKI (acute kidney injury) (HCC)   Decubitus ulcers   Aspiration pneumonia (HCC)   Anemia of chronic disease   COPD (chronic obstructive pulmonary disease) (HCC)   Sepsis, present on admission: Most likely secondary to recurrent aspiration pneumonia causing pneumonitis and possible infected decubitus ulcers .  His aspiration pneumonia in the past was attributed to hiatal hernia.  Chest x-ray showed persistent pneumonitis, right upper and lower lobe consolidation.  Also has decubitus ulcers.  Started on broad spectrum antibiotics.Continue aspiration precaution.  Follow-up cultures.  Patient on dysphagia 3 diet. Speech is following. He has history of ESBL UTI.  Follow-up urine cultures. Negative procalcitonin.  Continue current  antibiotics. Cultures have not shown any growth. Will continue IV antibiotics for at least 24 to 48 hours before changing to oral. He was febrile on presentation, currently afebrile and hemodynamically stable.  Aspiration pneumonia: Chest x-ray finding as above.  Currently on 5 L of oxygen per minute, this is his baseline.  He was not in any kind of respiratory distress this morning.  AKI: Most likely prerenal secondary to decreased oral intake, dehydration, continue IV fluids.  Monitor BMP.  Avoid nephrotoxins. Baseline kidney function is normal.  Normocytic anemia: Chronic, associated with chronic medical conditions and probably chronic slow bleed from the sacral ulcers.  No active blood loss.  He was  transfused with 1 unit of PRBC.  Monitor CBC.  His hemoglobin ranges from 7-9.  Iron studies showed severe iron deficiency of 14.  We will give him a dose of IV iron.  COPD: On home oxygen.  Currently not in exacerbation.  Continue bronchodilators  Decubitus ulcers: Present on admission.  Unstageable. wound care consulted.  Continue antibiotics.  Follow-up wound cultures.  Hypertension: Hypertensive today,will resume his home antihypertensives.  Goals of care: DNR.  Patient is being followed by hospice as an outpatient. Being followed by palliative care for goals of care discussion .  Pressure ulcers: Present on admission  Pressure Injury 05/08/19 Sacrum Left Deep Tissue Pressure Injury - Purple or maroon localized area of discolored intact skin or blood-filled blister due to damage of underlying soft tissue from pressure and/or shear. (Active)  05/08/19 1140  Location: Sacrum  Location Orientation: Left  Staging: Deep Tissue Pressure Injury - Purple or maroon localized area of discolored intact skin or blood-filled blister due to damage of underlying soft tissue from pressure and/or shear. (  per [place] nurse)  Wound Description (Comments):   Present on Admission: Yes (found on nightshift, none  noted on admission )     Pressure Injury 06/01/19 Heel Anterior;Right Unstageable - Full thickness tissue loss in which the base of the injury is covered by slough (yellow, tan, gray, green or brown) and/or eschar (tan, brown or black) in the wound bed. (Active)  06/01/19 0800  Location: Heel  Location Orientation: Anterior;Right  Staging: Unstageable - Full thickness tissue loss in which the base of the injury is covered by slough (yellow, tan, gray, green or brown) and/or eschar (tan, brown or black) in the wound bed. (per [place])  Wound Description (Comments):   Present on Admission: Yes     Pressure Injury 06/03/19 Hip Right;Lateral Stage 2 -  Partial thickness loss of dermis presenting as a shallow open injury with a red, pink wound bed without slough. (Active)  06/03/19 1500  Location: Hip  Location Orientation: Right;Lateral  Staging: Stage 2 -  Partial thickness loss of dermis presenting as a shallow open injury with a red, pink wound bed without slough. (per [place] nurse)  Wound Description (Comments):   Present on Admission: Yes     Pressure Injury 06/03/19 Ischial tuberosity Right;Medial Stage 2 -  Partial thickness loss of dermis presenting as a shallow open injury with a red, pink wound bed without slough. (Active)  06/03/19 1500  Location: Ischial tuberosity  Location Orientation: Right;Medial  Staging: Stage 2 -  Partial thickness loss of dermis presenting as a shallow open injury with a red, pink wound bed without slough. (per [place] nurse)  Wound Description (Comments):   Present on Admission: Yes     Pressure Injury 06/03/19 Other (Comment) Left;Anterior Stage 2 -  Partial thickness loss of dermis presenting as a shallow open injury with a red, pink wound bed without slough. (Active)  06/03/19 1500  Location: Other (Comment) (BKA stump)  Location Orientation: Left;Anterior  Staging: Stage 2 -  Partial thickness loss of dermis presenting as a shallow open injury with a  red, pink wound bed without slough. (per [place])  Wound Description (Comments):   Present on Admission: Yes     Pressure Injury 07/24/19 Ischial tuberosity Left Unstageable - Full thickness tissue loss in which the base of the injury is covered by slough (yellow, tan, gray, green or brown) and/or eschar (tan, brown or black) in the wound bed. (Active)  07/24/19   Location: Ischial tuberosity  Location Orientation: Left  Staging: Unstageable - Full thickness tissue loss in which the base of the injury is covered by slough (yellow, tan, gray, green or brown) and/or eschar (tan, brown or black) in the wound bed.  Wound Description (Comments):   Present on Admission: Yes     Pressure Injury 07/24/19 Ischial tuberosity Right Unstageable - Full thickness tissue loss in which the base of the injury is covered by slough (yellow, tan, gray, green or brown) and/or eschar (tan, brown or black) in the wound bed. (Active)  07/24/19   Location: Ischial tuberosity  Location Orientation: Right  Staging: Unstageable - Full thickness tissue loss in which the base of the injury is covered by slough (yellow, tan, gray, green or brown) and/or eschar (tan, brown or black) in the wound bed.  Wound Description (Comments):   Present on Admission: Yes     Pressure Injury 07/24/19 Ischial tuberosity Lower;Right Unstageable - Full thickness tissue loss in which the base of the injury is covered by slough (  yellow, tan, gray, green or brown) and/or eschar (tan, brown or black) in the wound bed. (Active)  07/24/19   Location: Ischial tuberosity  Location Orientation: Lower;Right  Staging: Unstageable - Full thickness tissue loss in which the base of the injury is covered by slough (yellow, tan, gray, green or brown) and/or eschar (tan, brown or black) in the wound bed.  Wound Description (Comments):   Present on Admission: Yes     Pressure Injury 07/24/19 Ankle Right;Lateral Unstageable - Full thickness tissue loss  in which the base of the injury is covered by slough (yellow, tan, gray, green or brown) and/or eschar (tan, brown or black) in the wound bed. (Active)  07/24/19   Location: Ankle  Location Orientation: Right;Lateral  Staging: Unstageable - Full thickness tissue loss in which the base of the injury is covered by slough (yellow, tan, gray, green or brown) and/or eschar (tan, brown or black) in the wound bed.  Wound Description (Comments):   Present on Admission: Yes     Pressure Injury 07/24/19 Heel Right Unstageable - Full thickness tissue loss in which the base of the injury is covered by slough (yellow, tan, gray, green or brown) and/or eschar (tan, brown or black) in the wound bed. (Active)  07/24/19   Location: Heel  Location Orientation: Right  Staging: Unstageable - Full thickness tissue loss in which the base of the injury is covered by slough (yellow, tan, gray, green or brown) and/or eschar (tan, brown or black) in the wound bed.  Wound Description (Comments):   Present on Admission: Yes     Pressure Injury 07/24/19 Sacrum Unstageable - Full thickness tissue loss in which the base of the injury is covered by slough (yellow, tan, gray, green or brown) and/or eschar (tan, brown or black) in the wound bed. (Active)  07/24/19   Location: Sacrum  Location Orientation:   Staging: Unstageable - Full thickness tissue loss in which the base of the injury is covered by slough (yellow, tan, gray, green or brown) and/or eschar (tan, brown or black) in the wound bed.  Wound Description (Comments):   Present on Admission: Yes     Pressure Injury 07/24/19 Foot Right;Lateral Unstageable - Full thickness tissue loss in which the base of the injury is covered by slough (yellow, tan, gray, green or brown) and/or eschar (tan, brown or black) in the wound bed. 3 locations of unstageable (Active)  07/24/19   Location: Foot  Location Orientation: Right;Lateral  Staging: Unstageable - Full thickness  tissue loss in which the base of the injury is covered by slough (yellow, tan, gray, green or brown) and/or eschar (tan, brown or black) in the wound bed.  Wound Description (Comments): 3 locations of unstageable wounds  Present on Admission: Yes         Nutrition Problem: Moderate Malnutrition Etiology: chronic illness      DVT prophylaxis:SCD Code Status: DNR Family Communication: Called and discussed with daughter on phone on 07/24/2019. Status is: Inpatient  Remains inpatient appropriate because:Hemodynamically unstable   Dispo: The patient is from: SNF              Anticipated d/c is to: SNF, daughter interested on a different nursing facility              Anticipated d/c date is: 1-2 days              Patient currently is not medically stable to d/c.  Needs IV antibiotics,IV fluids for now.Cultures should  be followed before DC    Consultants: None  Procedures:None  Antimicrobials:  Anti-infectives (From admission, onward)   Start     Dose/Rate Route Frequency Ordered Stop   07/24/19 1800  vancomycin (VANCOCIN) IVPB 1000 mg/200 mL premix     1,000 mg 200 mL/hr over 60 Minutes Intravenous Every 24 hours 07/23/19 2020     07/24/19 0600  ceFEPIme (MAXIPIME) 2 g in sodium chloride 0.9 % 100 mL IVPB     2 g 200 mL/hr over 30 Minutes Intravenous Every 12 hours 07/23/19 2024     07/24/19 0000  metroNIDAZOLE (FLAGYL) IVPB 500 mg     500 mg 100 mL/hr over 60 Minutes Intravenous Every 8 hours 07/23/19 2006     07/23/19 1615  vancomycin (VANCOREADY) IVPB 1250 mg/250 mL     1,250 mg 166.7 mL/hr over 90 Minutes Intravenous  Once 07/23/19 1600 07/23/19 2020   07/23/19 1600  ceFEPIme (MAXIPIME) 2 g in sodium chloride 0.9 % 100 mL IVPB     2 g 200 mL/hr over 30 Minutes Intravenous  Once 07/23/19 1556 07/23/19 1827   07/23/19 1600  metroNIDAZOLE (FLAGYL) IVPB 500 mg     500 mg 100 mL/hr over 60 Minutes Intravenous  Once 07/23/19 1556 07/23/19 1714   07/23/19 1600   vancomycin (VANCOCIN) IVPB 1000 mg/200 mL premix  Status:  Discontinued     1,000 mg 200 mL/hr over 60 Minutes Intravenous  Once 07/23/19 1556 07/23/19 1600      Subjective:  Patient seen and examined at the bedside this morning.  Hemodynamically stable.  Looks much better today.  More comfortable.  He says that he feels much better today and denies any complaints.  Objective: Vitals:   07/24/19 2316 07/25/19 0000 07/25/19 0354 07/25/19 0400  BP:  (!) 143/73  (!) 141/71  Pulse:  94  (!) 108  Resp:  (!) 27  (!) 36  Temp: 98.2 F (36.8 C)  98.4 F (36.9 C)   TempSrc: Oral  Oral   SpO2:  96%  96%  Weight:      Height:        Intake/Output Summary (Last 24 hours) at 07/25/2019 0731 Last data filed at 07/25/2019 0400 Gross per 24 hour  Intake 1477.37 ml  Output 525 ml  Net 952.37 ml   Filed Weights   07/24/19 0829  Weight: 60.5 kg    Examination:  General exam: Deconditioned, debilitated, chronically looking elderly male Respiratory system: Decreased air entry on the right lung, mild crackles on the right lung cardiovascular system: S1 & S2 heard, RRR. No JVD, murmurs, rubs, gallops or clicks. Gastrointestinal system: Abdomen is nondistended, soft and nontender. No organomegaly or masses felt. Normal bowel sounds heard. Central nervous system: Alert and awake, oriented to place and person Extremities: Left BKA Skin: Unstageable pressure ulcers on the buttocks, sacrum, right foot ankle/heel   Data Reviewed: I have personally reviewed following labs and imaging studies  CBC: Recent Labs  Lab 07/23/19 1524 07/24/19 0544 07/25/19 0222  WBC 10.4 11.6* 11.4*  NEUTROABS 7.3  --  8.0*  HGB 7.2* 8.2* 8.0*  HCT 24.1* 25.9* 25.4*  MCV 80.6 80.2 80.4  PLT 482* 454* 404*   Basic Metabolic Panel: Recent Labs  Lab 07/23/19 1524 07/24/19 0544 07/25/19 0222  NA 145 145 139  K 4.1 3.7 3.9  CL 109 112* 106  CO2 25 25 23   GLUCOSE 177* 102* 110*  BUN 37* 38* 36*    CREATININE  1.85* 1.70* 1.65*  CALCIUM 7.9* 7.9* 7.4*   GFR: Estimated Creatinine Clearance: 31.6 mL/min (A) (by C-G formula based on SCr of 1.65 mg/dL (H)). Liver Function Tests: Recent Labs  Lab 07/23/19 1524 07/24/19 0544  AST 22 20  ALT 14 15  ALKPHOS 57 50  BILITOT 0.4 0.5  PROT 6.0* 6.0*  ALBUMIN 2.0* 2.1*   No results for input(s): LIPASE, AMYLASE in the last 168 hours. No results for input(s): AMMONIA in the last 168 hours. Coagulation Profile: Recent Labs  Lab 07/23/19 1520 07/24/19 0544  INR 1.1 1.2   Cardiac Enzymes: No results for input(s): CKTOTAL, CKMB, CKMBINDEX, TROPONINI in the last 168 hours. BNP (last 3 results) No results for input(s): PROBNP in the last 8760 hours. HbA1C: No results for input(s): HGBA1C in the last 72 hours. CBG: No results for input(s): GLUCAP in the last 168 hours. Lipid Profile: No results for input(s): CHOL, HDL, LDLCALC, TRIG, CHOLHDL, LDLDIRECT in the last 72 hours. Thyroid Function Tests: No results for input(s): TSH, T4TOTAL, FREET4, T3FREE, THYROIDAB in the last 72 hours. Anemia Panel: No results for input(s): VITAMINB12, FOLATE, FERRITIN, TIBC, IRON, RETICCTPCT in the last 72 hours. Sepsis Labs: Recent Labs  Lab 07/23/19 1520 07/23/19 1730 07/24/19 0544  PROCALCITON  --   --  <0.10  LATICACIDVEN 2.7* 0.7  --     Recent Results (from the past 240 hour(s))  Blood Culture (routine x 2)     Status: None (Preliminary result)   Collection Time: 07/23/19  3:20 PM   Specimen: BLOOD LEFT HAND  Result Value Ref Range Status   Specimen Description   Final    BLOOD LEFT HAND Performed at [facility], [address], [place], [place] [address]    Special Requests   Final    BOTTLES DRAWN AEROBIC AND ANAEROBIC Blood Culture adequate volume Performed at [facility], [address], [place], [place] [address]    Culture   Final    NO GROWTH 2 DAYS Performed at [facility], [address] [place], [place] [address]    Report Status PENDING  Incomplete  Blood Culture (routine x 2)     Status: None (Preliminary result)   Collection Time: 07/23/19  4:15 PM   Specimen: BLOOD RIGHT HAND  Result Value Ref Range Status   Specimen Description   Final    BLOOD RIGHT HAND Performed at [facility], [address], [place], [place] [address]    Special Requests   Final    BOTTLES DRAWN AEROBIC AND ANAEROBIC Blood Culture adequate volume Performed at [facility], [address] [place], [place] [address]    Culture   Final    NO GROWTH 2 DAYS Performed at [facility], [address], [place], [place] [address]    Report Status PENDING  Incomplete  Urine culture     Status: None (Preliminary result)   Collection Time: 07/24/19  8:03 AM   Specimen: In/Out Cath Urine  Result Value Ref Range Status   Specimen Description   Final    IN/OUT CATH URINE Performed at [facility], [address] [place], [place] [address]    Special Requests   Final    NONE Performed at [facility], [address] [place], [place] [address]    Culture   Final    CULTURE REINCUBATED FOR BETTER GROWTH Performed at [facility]  [facility], [address], [place], [place] [address]    Report Status PENDING  Incomplete  MRSA PCR Screening     Status: Abnormal   Collection Time: 07/24/19  8:03 AM   Specimen: Nasopharyngeal  Result Value Ref Range Status   MRSA by PCR POSITIVE (A) NEGATIVE Final    Comment:        The GeneXpert MRSA Assay (FDA approved for NASAL specimens only), is one component of a comprehensive MRSA colonization surveillance program. It is not intended to diagnose MRSA infection nor to guide or monitor treatment for MRSA infections. RESULT CALLED TO, READ BACK BY AND VERIFIED WITH: GORE,D. RN @1201  ON 05.11.2021 BY COHEN,K Performed at [facility], [address], [place], [place]          Radiology Studies: [facility] Chest Port 1 View  Result Date: 07/23/2019 CLINICAL DATA:  Sepsis. EXAM: PORTABLE CHEST 1 VIEW COMPARISON:  June 04, 2019 FINDINGS: There are prominent interstitial lung markings bilaterally. There is a persistent hazy airspace opacity in the right mid lung zone. There is a small right-sided pleural effusion. The heart size is stable. There is no pneumothorax. No acute osseous abnormality. IMPRESSION: 1. Persistent findings of pneumonitis, especially on the right. 2. Persistent but improved right upper and right lower lobe consolidation. 3. Small bilateral pleural effusions, right greater than left. Electronically Signed   By: June 06, 2019 M.D.   On: 07/23/2019 16:36        Scheduled Meds: . sodium chloride   Intravenous Once  . Chlorhexidine Gluconate Cloth  6 each Topical Daily  . cholecalciferol  1,000 Units Oral Daily  . collagenase   Topical Daily  . feeding supplement (ENSURE ENLIVE)  237 mL Oral BID BM  . feeding supplement (PRO-STAT SUGAR FREE 64)  30 mL Oral BID  . mouth rinse  15 mL Mouth Rinse BID  . mirtazapine  7.5 mg Oral QHS  . mupirocin ointment  1 application Nasal BID  . pantoprazole  40 mg Oral Daily  . polyethylene glycol  17 g Oral Daily   Continuous Infusions: . sodium chloride    . sodium chloride    . ceFEPime (MAXIPIME) IV Stopped (07/25/19 0546)  . metronidazole Stopped (07/25/19 0043)  . vancomycin Stopped (07/24/19 1954)     LOS: 2 days    Time spent: 35 mins.More than 50% of that time was spent in counseling and/or coordination of care.      09/23/19, MD [facility] P5/02/2020, 7:31 AM

## 2019-07-26 LAB — CBC WITH DIFFERENTIAL/PLATELET
Abs Immature Granulocytes: 0.04 10*3/uL (ref 0.00–0.07)
Basophils Absolute: 0.1 10*3/uL (ref 0.0–0.1)
Basophils Relative: 1 %
Eosinophils Absolute: 0.3 10*3/uL (ref 0.0–0.5)
Eosinophils Relative: 3 %
HCT: 25.6 % — ABNORMAL LOW (ref 39.0–52.0)
Hemoglobin: 8 g/dL — ABNORMAL LOW (ref 13.0–17.0)
Immature Granulocytes: 0 %
Lymphocytes Relative: 15 %
Lymphs Abs: 1.6 10*3/uL (ref 0.7–4.0)
MCH: 25.7 pg — ABNORMAL LOW (ref 26.0–34.0)
MCHC: 31.3 g/dL (ref 30.0–36.0)
MCV: 82.3 fL (ref 80.0–100.0)
Monocytes Absolute: 1.2 10*3/uL — ABNORMAL HIGH (ref 0.1–1.0)
Monocytes Relative: 11 %
Neutro Abs: 7.5 10*3/uL (ref 1.7–7.7)
Neutrophils Relative %: 70 %
Platelets: 368 10*3/uL (ref 150–400)
RBC: 3.11 MIL/uL — ABNORMAL LOW (ref 4.22–5.81)
RDW: 18.6 % — ABNORMAL HIGH (ref 11.5–15.5)
WBC: 10.7 10*3/uL — ABNORMAL HIGH (ref 4.0–10.5)
nRBC: 0 % (ref 0.0–0.2)

## 2019-07-26 LAB — BASIC METABOLIC PANEL
Anion gap: 8 (ref 5–15)
BUN: 35 mg/dL — ABNORMAL HIGH (ref 8–23)
CO2: 23 mmol/L (ref 22–32)
Calcium: 7.5 mg/dL — ABNORMAL LOW (ref 8.9–10.3)
Chloride: 108 mmol/L (ref 98–111)
Creatinine, Ser: 1.61 mg/dL — ABNORMAL HIGH (ref 0.61–1.24)
GFR calc Af Amer: 47 mL/min — ABNORMAL LOW (ref >60)
GFR calc non Af Amer: 40 mL/min — ABNORMAL LOW (ref >60)
Glucose, Bld: 94 mg/dL (ref 70–99)
Potassium: 3.8 mmol/L (ref 3.5–5.1)
Sodium: 139 mmol/L (ref 135–145)

## 2019-07-26 MED ORDER — AMOXICILLIN-POT CLAVULANATE 875-125 MG PO TABS
1.0000 | ORAL_TABLET | Freq: Two times a day (BID) | ORAL | Status: DC
  Administered 2019-07-26 – 2019-07-27 (×2): 1 via ORAL
  Filled 2019-07-26 (×2): qty 1

## 2019-07-26 MED ORDER — SODIUM CHLORIDE 0.9 % IV SOLN: INTRAVENOUS | Status: DC

## 2019-07-26 NOTE — Progress Notes (Signed)
Pharmacy Antibiotic Note  Paul Suarez is a 79 y.o. male admitted on 07/23/2019 with sepsis.  Pharmacy has been consulted for vancomycin & cefepime dosing 07/26/2019  D#3 vanc/cefepime/flagyl.  Afebrile.  PCT neg 5/11. SCr down to 1.61; WBC down to 10.7. 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 Sensitivities pending.  Plan: Cefepime 2 gm IV q12 Flagyl 500 mg IV q8h per MD Vancomycin 1250 mg loading dose given at 1730 Vancomycin 1000 mg IV q24 for Vanc trough goal 15-20 mcg/ml F/u renal fxn, WBC, temp, culture data Vancomycin trough as needed Daily Scr while on both vancomycin & zosyn Anticipate de-escalation of abx tomorrow  Temp (24hrs), Avg:98.1 F (36.7 C), Min:97.5 F (36.4 C), Max:98.9 F (37.2 C)  Recent Labs  Lab 07/23/19 1520 07/23/19 1524 07/23/19 1730 07/24/19 0544 07/25/19 0222 07/26/19 0215  WBC  --  10.4  --  11.6* 11.4* 10.7*  CREATININE  --  1.85*  --  1.70* 1.65* 1.61*  LATICACIDVEN 2.7*  --  0.7  --   --   --     Estimated Creatinine Clearance: 32.4 mL/min (A) (by C-G formula based on SCr of 1.61 mg/dL (H)).    Allergies  Allergen Reactions  . Keflex [Cephalexin]     Allergy listed on MAR (no details), date unknown but has tolerated cefepime, Rocephin, Zosyn, and Augmentin in the past year. Last cefazolin was in Feb 2021.    Antimicrobials this admission: 5/10 vanc>> 5/10 flagyl>> 5/10 cefepime>> Dose adjustments this admission:  Microbiology results: 5/10 BCx2: NGTD 5/10 UCx: 30k colonies of E.faecium  5/10 MRSA PCR: positive  Thank you for allowing pharmacy to be a part of this patient's care.  Herby Abraham, Pharm.D [phone] 07/26/2019 10:43 AM

## 2019-07-26 NOTE — NC FL2 (Addendum)
[place] LEVEL OF CARE SCREENING TOOL     IDENTIFICATION  Patient Name: Paul Suarez Birthdate: 1940-04-07 Sex: male Admission Date (Current Location): 07/23/2019  [facility] and [place] Number:  Herbalist and Address:  [facility],  [place], [place]      Provider Number: [id number]  Attending Physician Name and Address:  Rosemarie Beath [facility]*  Relative Name and Phone Number:  Ben[place], Brensinger Daughter   [phone]    Current Level of Care: Hospital Recommended Level of Care: [place] Prior Approval Number:    Date Approved/Denied:   PASRR Number: [id number]  Discharge Plan: SNF    Current Diagnoses: Patient Active Problem List   Diagnosis Date Noted  . Sepsis ([place]) 07/23/2019  . Epistaxis 05/31/2019  . COPD (chronic obstructive pulmonary disease) ([place]) 05/31/2019  . Abscess of lung with pneumonia ([place])   . Aspiration pneumonia ([place]) 05/21/2019  . Anemia of chronic disease 05/21/2019  . Protein-calorie malnutrition, severe 05/10/2019  . Decubitus ulcers 05/08/2019  . Staphylococcus aureus bacteremia 05/06/2019  . Severe sepsis ([place]) 05/02/2019  . Acute on chronic respiratory failure with hypoxia ([place]) 05/02/2019  . AKI (acute kidney injury) ([place]) 05/02/2019  . Acute blood loss anemia 05/02/2019  . Tachycardia 04/26/2019  . AMS (altered mental status) 02/13/2019  . UTI due to extended-spectrum beta lactamase (ESBL) producing Escherichia coli 02/12/2019  . CAD (coronary artery disease) 02/12/2019  . Essential hypertension 02/12/2019  . Vitamin D deficiency 02/12/2019  . Seasonal allergies 02/12/2019  . Muscle spasm/History of left BKA 02/12/2019  . Insomnia 02/12/2019  . COPD with acute exacerbation ([place]) 02/12/2019  . Major depression, recurrent ([place]) 02/12/2019  . Hypokalemia 02/12/2019  . Constipation 02/12/2019  . Seizure disorder ([place]) 02/12/2019    Orientation RESPIRATION  BLADDER Height & Weight     Self, Situation, Place  5L incontinent Weight: 133 lb 6.1 oz (60.5 kg) Height:  5\' 8"  (172.7 cm)  BEHAVIORAL SYMPTOMS/MOOD NEUROLOGICAL BOWEL NUTRITION STATUS      icontinent Diet(Dys. 3) Mech Soft, Softer food only   AMBULATORY STATUS COMMUNICATION OF NEEDS Skin   Extensive Assist Verbally PU Stage and Appropriate Care    Pressure Injury POA: Yes Right ischium 3X1cm in patchy areas, 100% slough, small amt tan drainage Right lower ischium 4X5cm in patchy areas, 100% slough, small amt tan drainage Sacrum 7X4cm in patchy areas, 100% slough, small amt tan drainage Left ischium 2X1cm, 100% slough, small amt tan drainage Right outer ankle 2X1cm, 100% slough, small amt tan drainage Right heel 2X2cm, 100% slough, small amt tan drainage Right outer foot, 3X2cm, 1X1cm, 3X3cm, 100% slough, small amt tan drainage Dressing procedure/placement/frequency: Pt is on a low airloss mattress to reduce pressure. Float heels to reduce pressure. Topical treatment orders provided for bedside nurses to perform daily as follows to assist with enzymatic debridement: Apply Santyl to following locations Q day: Sacrum, right heel, right ankle, right outer foot (3 sites) bilat ischium.  Then cover with moist gauze and foam dressings.  (Change foam dressings Q 3 days or PRN soiling.)                     Personal Care Assistance Level of Assistance  Feeding, Bathing, Dressing Bathing Assistance: Maximum assistance Feeding assistance: Maximum assistance  Dressing Assistance: Maximum assistance     Functional Limitations Info  Sight, Hearing, Speech Sight Info: Adequate Hearing Info: Adequate Speech Info: Adequate    SPECIAL CARE  FACTORS FREQUENCY  PT (By licensed PT), OT (By licensed OT)     PT Frequency: 5x/week OT Frequency: 5x/week            Contractures Contractures Info: present    Additional Factors Info  Code Status, Allergies, Psychotropic, Insulin Sliding  Scale Code Status Info: DNR Allergies Info: Allergies: Keflex Cephalexin Psychotropic Info: Remeron         Current Medications (07/26/2019):  This is the current hospital active medication list Current Facility-Administered Medications  Medication Dose Route Frequency Provider Last Rate Last Admin  . 0.9 %  sodium chloride infusion   Intravenous Continuous Burnadette Pop, MD 75 mL/hr at 07/26/19 0800 Rate Verify at 07/26/19 0800  . acetaminophen (TYLENOL) tablet 650 mg  650 mg Oral Q4H PRN Hillary Bow, DO   650 mg at 07/26/19 1137  . alum & mag hydroxide-simeth (MAALOX/MYLANTA) 200-200-20 MG/5ML suspension 30 mL  30 mL Oral Q4H PRN Hillary Bow, DO      . amLODipine (NORVASC) tablet 5 mg  5 mg Oral Daily Burnadette Pop, MD   5 mg at 07/26/19 0933  . bisacodyl (DULCOLAX) EC tablet 10 mg  10 mg Oral Daily PRN Hillary Bow, DO      . ceFEPIme (MAXIPIME) 2 g in sodium chloride 0.9 % 100 mL IVPB  2 g Intravenous Q12H Herby Abraham, RPH   Stopped at 07/26/19 7829  . Chlorhexidine Gluconate Cloth 2 % PADS 6 each  6 each Topical Daily Burnadette Pop, MD   6 each at 07/25/19 2045  . cholecalciferol (VITAMIN D3) tablet 1,000 Units  1,000 Units Oral Daily Hillary Bow, DO   1,000 Units at 07/26/19 0933  . collagenase (SANTYL) ointment   Topical Daily Burnadette Pop, MD   Given at 07/25/19 2045  . diphenhydrAMINE (BENADRYL) capsule 25 mg  25 mg Oral Daily PRN Hillary Bow, DO      . feeding supplement (ENSURE ENLIVE) (ENSURE ENLIVE) liquid 237 mL  237 mL Oral BID BM Tegeler, Canary Brim, MD   237 mL at 07/26/19 1055  . feeding supplement (PRO-STAT SUGAR FREE 64) liquid 30 mL  30 mL Oral BID Hillary Bow, DO   Stopped at 07/24/19 1000  . HYDROmorphone (DILAUDID) tablet 1 mg  1 mg Oral Q2H PRN Hillary Bow, DO      . ipratropium-albuterol (DUONEB) 0.5-2.5 (3) MG/3ML nebulizer solution 3 mL  3 mL Nebulization Q6H PRN Burnadette Pop, MD      . LORazepam (ATIVAN) tablet  0.5 mg  0.5 mg Oral Q4H PRN Hillary Bow, DO      . MEDLINE mouth rinse  15 mL Mouth Rinse BID Burnadette Pop, MD   15 mL at 07/26/19 0934  . metroNIDAZOLE (FLAGYL) IVPB 500 mg  500 mg Intravenous Q8H Hillary Bow, DO   Stopped at 07/26/19 0910  . mirtazapine (REMERON) tablet 7.5 mg  7.5 mg Oral QHS Lyda Perone M, DO   7.5 mg at 07/25/19 2244  . mupirocin ointment (BACTROBAN) 2 % 1 application  1 application Nasal BID Burnadette Pop, MD   1 application at 07/26/19 0934  . ondansetron (ZOFRAN) injection 4 mg  4 mg Intravenous Q6H PRN Hillary Bow, DO      . pantoprazole (PROTONIX) EC tablet 40 mg  40 mg Oral Daily Lyda Perone M, DO   40 mg at 07/26/19 0933  . polyethylene glycol (MIRALAX / GLYCOLAX) packet 17 g  17  g Oral Daily Hillary Bow, DO   17 g at 07/26/19 1224  . vancomycin (VANCOCIN) IVPB 1000 mg/200 mL premix  1,000 mg Intravenous Q24H Herby Abraham, [facility]   Stopped at 07/25/19 1953     Discharge Medications: Please see discharge summary for a list of discharge medications.  Relevant Imaging Results:  Relevant Lab Results:   Additional Information SSN: [id number]  Clearance Coots, LCSW

## 2019-07-26 NOTE — TOC Progression Note (Addendum)
Transition of Care [facility]) - Progression Note    Patient Details  Name: Paul Suarez MRN: [id number] Date of Birth: [birth year]  Transition [facility] CM/SW Contact  Clearance Coots, LCSW Phone Number: 07/26/2019, 1:26 PM  Clinical Narrative:    CSW reached out to the patient daughter to discuss discharge plans. Per daughter the patient was at [facility] for short rehab and the rehab stopped. Daughter reports she wants the patient to go to another facility for rehab in hopes to transition the patient to ALF [facility] in [place]. Daughter provided a few facilities she prefers to send the patient. CSW explain the SNF process. Daughter reports understanding as she has been through the SNF process before.   Per chart review the patient discharge to residential hospice facility on March 24th. The patient returned to [facility] soon after. CSW was informed by SNF the patient does not have rehab days. The patient has not had a 60 day wellness period to return under his medicare A part benefit. The facility has used medicaid and medicare b insurance benefits.   CSW reached back out to the patient daughter Paul Suarez to explain statement above to the patient daughter. The patient will discharge to SNF under medicaid benefits and receive therapy under medicare part b therefore the patient SNF options may be limited. Patient report understanding.   The patient is active with [facility] for outpatient palliative care services.   Patient will need PT/OT consult.     Expected Discharge Plan: Skilled Nursing Facility Barriers to Discharge: Continued Medical Work up  Expected Discharge Plan and Services Expected Discharge Plan: Skilled Nursing Facility   Discharge Planning Services: CM Consult   Living arrangements for the past 2 months: Single Family Home                                      Social Determinants of Health (SDOH) Interventions    Readmission Risk  Interventions Readmission Risk Prevention Plan 05/22/2019  Transportation Screening Complete  PCP or Specialist Appt within 3-5 Days Complete  HRI or Home Care Consult Complete  Social Work Consult for Recovery Care Planning/Counseling Complete  Palliative Care Screening Complete  Medication Review [facility]) Referral to Pharmacy

## 2019-07-26 NOTE — Progress Notes (Signed)
PROGRESS NOTE    Paul Suarez  YCX:[id number] DOB: 1940/09/07 DOA: 07/23/2019 PCP: Pecola Lawless, MD   Brief Narrative:  Patient is a 79 year old male with history of COPD, hypertension, coronary artery disease, on home oxygen, history of ESBL UTI, Covid illness in February, lung abscess, aspiration syndrome, hiatal hernia who is currently under hospice care who and was brought to the emergency department from skilled nursing facility for the evaluation of worsening cough, fever, tachycardia, tachypnea.  Symptoms started 3 days ago and gradually worsening.  Also history of congestion, chills, dysuria.  He has been admitted several times recently.  He was admitted in February for ESBL UTI and was admitted in late March for aspiration pneumonia and was ultimately discharged with palliative care follow-up.  Patient also has sacral decubitus, lateral right foot decubitus.  Chest x-ray showed persistent pneumonitis, right upper and lower lobe consolidation.  Started on broad-spectrum antibiotics, culture sent.  Palliative care also consulted for goals of care discussion.  Assessment & Plan:   Principal Problem:   Sepsis (HCC) Active Problems:   Essential hypertension   AKI (acute kidney injury) (HCC)   Decubitus ulcers   Aspiration pneumonia (HCC)   Anemia of chronic disease   COPD (chronic obstructive pulmonary disease) (HCC)  Sepsis, present on admission: Most likely secondary to recurrent aspiration pneumonia causing pneumonitis and possible infected decubitus ulcers .  His aspiration pneumonia in the past was attributed to hiatal hernia.  Chest x-ray showed persistent pneumonitis, right upper and lower lobe consolidation.  Also has decubitus ulcers.  Started on broad spectrum antibiotics, continue for now. Continue aspiration precaution.  Follow-up cultures.  Patient on dysphagia 3 diet. Speech is following. He has history of ESBL UTI.  Follow-up urine cultures, negative so far. Negative  procalcitonin.  Continue current antibiotics. Cultures have not shown any growth. Will continue IV antibiotics for at least 24 to 48 hours before changing to oral. He was febrile on presentation, currently afebrile and hemodynamically stable.  Aspiration pneumonia: Chest x-ray finding as above.  Currently on 5 L of oxygen per minute, this is his baseline.  He was not in any kind of respiratory distress this morning.  AKI: Most likely prerenal secondary to decreased oral intake, dehydration, continue IV fluids.  Monitor BMP, renal function is improving.  Avoid nephrotoxins. Baseline kidney function is normal.  Normocytic anemia: Chronic, associated with chronic medical conditions and probably chronic slow bleed from the sacral ulcers.  No active blood loss.  He was  transfused with 1 unit of PRBC.  Monitor CBC.  His hemoglobin ranges from 7-9.  Iron studies showed severe iron deficiency of 14.  We will give him a dose of IV iron.  COPD: On home oxygen.  Currently not in exacerbation.  Continue bronchodilators  Decubitus ulcers: Present on admission.  Unstageable. wound care consulted.  Continue antibiotics.  Follow-up wound cultures.  Hypertension: Normotensive today,have resumed his home antihypertensives.  Goals of care: DNR.  Patient is being followed by hospice as an outpatient. Being followed by palliative care for goals of care discussion .  Pressure ulcers: Present on admission  Pressure Injury 05/08/19 Sacrum Left Deep Tissue Pressure Injury - Purple or maroon localized area of discolored intact skin or blood-filled blister due to damage of underlying soft tissue from pressure and/or shear. (Active)  05/08/19 1140  Location: Sacrum  Location Orientation: Left  Staging: Deep Tissue Pressure Injury - Purple or maroon localized area of discolored intact skin or blood-filled blister due  to damage of underlying soft tissue from pressure and/or shear. (per WOC nurse)  Wound Description  (Comments):   Present on Admission: Yes (found on nightshift, none noted on admission )     Pressure Injury 06/01/19 Heel Anterior;Right Unstageable - Full thickness tissue loss in which the base of the injury is covered by slough (yellow, tan, gray, green or brown) and/or eschar (tan, brown or black) in the wound bed. (Active)  06/01/19 0800  Location: Heel  Location Orientation: Anterior;Right  Staging: Unstageable - Full thickness tissue loss in which the base of the injury is covered by slough (yellow, tan, gray, green or brown) and/or eschar (tan, brown or black) in the wound bed. (per WOC nurse)  Wound Description (Comments):   Present on Admission: Yes     Pressure Injury 06/03/19 Hip Right;Lateral Stage 2 -  Partial thickness loss of dermis presenting as a shallow open injury with a red, pink wound bed without slough. (Active)  06/03/19 1500  Location: Hip  Location Orientation: Right;Lateral  Staging: Stage 2 -  Partial thickness loss of dermis presenting as a shallow open injury with a red, pink wound bed without slough. (per WOC nurse)  Wound Description (Comments):   Present on Admission: Yes     Pressure Injury 06/03/19 Ischial tuberosity Right;Medial Stage 2 -  Partial thickness loss of dermis presenting as a shallow open injury with a red, pink wound bed without slough. (Active)  06/03/19 1500  Location: Ischial tuberosity  Location Orientation: Right;Medial  Staging: Stage 2 -  Partial thickness loss of dermis presenting as a shallow open injury with a red, pink wound bed without slough. (per WOC nurse)  Wound Description (Comments):   Present on Admission: Yes     Pressure Injury 06/03/19 Other (Comment) Left;Anterior Stage 2 -  Partial thickness loss of dermis presenting as a shallow open injury with a red, pink wound bed without slough. (Active)  06/03/19 1500  Location: Other (Comment) (BKA stump)  Location Orientation: Left;Anterior  Staging: Stage 2 -  Partial  thickness loss of dermis presenting as a shallow open injury with a red, pink wound bed without slough. (per WOC nurse)  Wound Description (Comments):   Present on Admission: Yes     Pressure Injury 07/24/19 Ischial tuberosity Left Unstageable - Full thickness tissue loss in which the base of the injury is covered by slough (yellow, tan, gray, green or brown) and/or eschar (tan, brown or black) in the wound bed. (Active)  07/24/19   Location: Ischial tuberosity  Location Orientation: Left  Staging: Unstageable - Full thickness tissue loss in which the base of the injury is covered by slough (yellow, tan, gray, green or brown) and/or eschar (tan, brown or black) in the wound bed.  Wound Description (Comments):   Present on Admission: Yes     Pressure Injury 07/24/19 Ischial tuberosity Right Unstageable - Full thickness tissue loss in which the base of the injury is covered by slough (yellow, tan, gray, green or brown) and/or eschar (tan, brown or black) in the wound bed. (Active)  07/24/19   Location: Ischial tuberosity  Location Orientation: Right  Staging: Unstageable - Full thickness tissue loss in which the base of the injury is covered by slough (yellow, tan, gray, green or brown) and/or eschar (tan, brown or black) in the wound bed.  Wound Description (Comments):   Present on Admission: Yes     Pressure Injury 07/24/19 Ischial tuberosity Lower;Right Unstageable - Full thickness tissue loss in  which the base of the injury is covered by slough (yellow, tan, gray, green or brown) and/or eschar (tan, brown or black) in the wound bed. (Active)  07/24/19   Location: Ischial tuberosity  Location Orientation: Lower;Right  Staging: Unstageable - Full thickness tissue loss in which the base of the injury is covered by slough (yellow, tan, gray, green or brown) and/or eschar (tan, brown or black) in the wound bed.  Wound Description (Comments):   Present on Admission: Yes     Pressure Injury  07/24/19 Ankle Right;Lateral Unstageable - Full thickness tissue loss in which the base of the injury is covered by slough (yellow, tan, gray, green or brown) and/or eschar (tan, brown or black) in the wound bed. (Active)  07/24/19   Location: Ankle  Location Orientation: Right;Lateral  Staging: Unstageable - Full thickness tissue loss in which the base of the injury is covered by slough (yellow, tan, gray, green or brown) and/or eschar (tan, brown or black) in the wound bed.  Wound Description (Comments):   Present on Admission: Yes     Pressure Injury 07/24/19 Heel Right Unstageable - Full thickness tissue loss in which the base of the injury is covered by slough (yellow, tan, gray, green or brown) and/or eschar (tan, brown or black) in the wound bed. (Active)  07/24/19   Location: Heel  Location Orientation: Right  Staging: Unstageable - Full thickness tissue loss in which the base of the injury is covered by slough (yellow, tan, gray, green or brown) and/or eschar (tan, brown or black) in the wound bed.  Wound Description (Comments):   Present on Admission: Yes     Pressure Injury 07/24/19 Sacrum Unstageable - Full thickness tissue loss in which the base of the injury is covered by slough (yellow, tan, gray, green or brown) and/or eschar (tan, brown or black) in the wound bed. (Active)  07/24/19   Location: Sacrum  Location Orientation:   Staging: Unstageable - Full thickness tissue loss in which the base of the injury is covered by slough (yellow, tan, gray, green or brown) and/or eschar (tan, brown or black) in the wound bed.  Wound Description (Comments):   Present on Admission: Yes     Pressure Injury 07/24/19 Foot Right;Lateral Unstageable - Full thickness tissue loss in which the base of the injury is covered by slough (yellow, tan, gray, green or brown) and/or eschar (tan, brown or black) in the wound bed. 3 locations of unstageable (Active)  07/24/19   Location: Foot  Location  Orientation: Right;Lateral  Staging: Unstageable - Full thickness tissue loss in which the base of the injury is covered by slough (yellow, tan, gray, green or brown) and/or eschar (tan, brown or black) in the wound bed.  Wound Description (Comments): 3 locations of unstageable wounds  Present on Admission: Yes         Nutrition Problem: Moderate Malnutrition Etiology: chronic illness      DVT prophylaxis:SCD Code Status: DNR Family Communication: Called and discussed with daughter on phone on 07/24/2019. Status is: Inpatient  Remains inpatient appropriate because:Hemodynamically unstable, AKI.  Dispo: The patient is from: SNF              Anticipated d/c is to: SNF, daughter interested on a different nursing facility              Anticipated d/c date is: 1-2 days              Patient currently is not medically stable  to d/c.  Needs IV antibiotics,IV fluids for now.Cultures should be followed before DC  Consultants: None  Procedures:None  Antimicrobials:  Anti-infectives (From admission, onward)   Start     Dose/Rate Route Frequency Ordered Stop   07/24/19 1800  vancomycin (VANCOCIN) IVPB 1000 mg/200 mL premix     1,000 mg 200 mL/hr over 60 Minutes Intravenous Every 24 hours 07/23/19 2020     07/24/19 0600  ceFEPIme (MAXIPIME) 2 g in sodium chloride 0.9 % 100 mL IVPB     2 g 200 mL/hr over 30 Minutes Intravenous Every 12 hours 07/23/19 2024     07/24/19 0000  metroNIDAZOLE (FLAGYL) IVPB 500 mg     500 mg 100 mL/hr over 60 Minutes Intravenous Every 8 hours 07/23/19 2006     07/23/19 1615  vancomycin (VANCOREADY) IVPB 1250 mg/250 mL     1,250 mg 166.7 mL/hr over 90 Minutes Intravenous  Once 07/23/19 1600 07/23/19 2020   07/23/19 1600  ceFEPIme (MAXIPIME) 2 g in sodium chloride 0.9 % 100 mL IVPB     2 g 200 mL/hr over 30 Minutes Intravenous  Once 07/23/19 1556 07/23/19 1827   07/23/19 1600  metroNIDAZOLE (FLAGYL) IVPB 500 mg     500 mg 100 mL/hr over 60 Minutes  Intravenous  Once 07/23/19 1556 07/23/19 1714   07/23/19 1600  vancomycin (VANCOCIN) IVPB 1000 mg/200 mL premix  Status:  Discontinued     1,000 mg 200 mL/hr over 60 Minutes Intravenous  Once 07/23/19 1556 07/23/19 1600      Subjective: Patient seen and examined at the bedside this morning.  Hemodynamically stable.  Looks chronically ill but comfortable and denies any complaints.  Objective: Vitals:   07/25/19 2326 07/26/19 0000 07/26/19 0330 07/26/19 0400  BP:  (!) 123/47  (!) 115/46  Pulse:  86  90  Resp:  16  (!) 22  Temp: 97.8 F (36.6 C)  98.1 F (36.7 C)   TempSrc: Oral  Axillary   SpO2:  91%  97%  Weight:      Height:        Intake/Output Summary (Last 24 hours) at 07/26/2019 0750 Last data filed at 07/26/2019 0416 Gross per 24 hour  Intake 2158.16 ml  Output 1025 ml  Net 1133.16 ml   Filed Weights   07/24/19 0829  Weight: 60.5 kg    Examination:  General exam: Deconditioned, debilitated, chronically looking elderly male Respiratory system: Decreased air entry on the right lung, mild crackles on the right lung cardiovascular system: S1 & S2 heard, RRR. No JVD, murmurs, rubs, gallops or clicks. Gastrointestinal system: Abdomen is nondistended, soft and nontender. No organomegaly or masses felt. Normal bowel sounds heard. Central nervous system: Alert and awake, oriented to place and person Extremities: Left BKA Skin: Unstageable pressure ulcers on the buttocks, sacrum, right foot ankle/heel   Data Reviewed: I have personally reviewed following labs and imaging studies  CBC: Recent Labs  Lab 07/23/19 1524 07/24/19 0544 07/25/19 0222 07/26/19 0215  WBC 10.4 11.6* 11.4* 10.7*  NEUTROABS 7.3  --  8.0* 7.5  HGB 7.2* 8.2* 8.0* 8.0*  HCT 24.1* 25.9* 25.4* 25.6*  MCV 80.6 80.2 80.4 82.3  PLT 482* 454* 404* 368   Basic Metabolic Panel: Recent Labs  Lab 07/23/19 1524 07/24/19 0544 07/25/19 0222 07/26/19 0215  NA 145 145 139 139  K 4.1 3.7 3.9 3.8  CL  109 112* 106 108  CO2 25 25 23 23   GLUCOSE 177* 102* 110*  94  BUN 37* 38* 36* 35*  CREATININE 1.85* 1.70* 1.65* 1.61*  CALCIUM 7.9* 7.9* 7.4* 7.5*   GFR: Estimated Creatinine Clearance: 32.4 mL/min (A) (by C-G formula based on SCr of 1.61 mg/dL (H)). Liver Function Tests: Recent Labs  Lab 07/23/19 1524 07/24/19 0544  AST 22 20  ALT 14 15  ALKPHOS 57 50  BILITOT 0.4 0.5  PROT 6.0* 6.0*  ALBUMIN 2.0* 2.1*   No results for input(s): LIPASE, AMYLASE in the last 168 hours. No results for input(s): AMMONIA in the last 168 hours. Coagulation Profile: Recent Labs  Lab 07/23/19 1520 07/24/19 0544  INR 1.1 1.2   Cardiac Enzymes: No results for input(s): CKTOTAL, CKMB, CKMBINDEX, TROPONINI in the last 168 hours. BNP (last 3 results) No results for input(s): PROBNP in the last 8760 hours. HbA1C: No results for input(s): HGBA1C in the last 72 hours. CBG: No results for input(s): GLUCAP in the last 168 hours. Lipid Profile: No results for input(s): CHOL, HDL, LDLCALC, TRIG, CHOLHDL, LDLDIRECT in the last 72 hours. Thyroid Function Tests: No results for input(s): TSH, T4TOTAL, FREET4, T3FREE, THYROIDAB in the last 72 hours. Anemia Panel: Recent Labs    07/24/19 1520  FERRITIN 118  TIBC 185*  IRON 14*   Sepsis Labs: Recent Labs  Lab 07/23/19 1520 07/23/19 1730 07/24/19 0544  PROCALCITON  --   --  <0.10  LATICACIDVEN 2.7* 0.7  --     Recent Results (from the past 240 hour(s))  Blood Culture (routine x 2)     Status: None (Preliminary result)   Collection Time: 07/23/19  3:20 PM   Specimen: BLOOD LEFT HAND  Result Value Ref Range Status   Specimen Description   Final    BLOOD LEFT HAND Performed at [facility], [address], [place], [place] [address]    Special Requests   Final    BOTTLES DRAWN AEROBIC AND ANAEROBIC Blood Culture adequate volume Performed at [facility], [address] [place], [place] [address]     Culture   Final    NO GROWTH 2 DAYS Performed at [facility], [address], [place], [place] [address]    Report Status PENDING  Incomplete  Blood Culture (routine x 2)     Status: None (Preliminary result)   Collection Time: 07/23/19  4:15 PM   Specimen: BLOOD RIGHT HAND  Result Value Ref Range Status   Specimen Description   Final    BLOOD RIGHT HAND Performed at [facility], [address] [place], [place] [address]    Special Requests   Final    BOTTLES DRAWN AEROBIC AND ANAEROBIC Blood Culture adequate volume Performed at [facility], [address], [place], [place] [address]    Culture   Final    NO GROWTH 2 DAYS Performed at [facility], [address], [place], [place] [address]    Report Status PENDING  Incomplete  Urine culture     Status: Abnormal (Preliminary result)   Collection Time: 07/24/19  8:03 AM   Specimen: In/Out Cath Urine  Result Value Ref Range Status   Specimen Description   Final    IN/OUT CATH URINE Performed at [facility], [address], [place], [place] [address]    Special Requests   Final    NONE Performed at [facility], [address], [place], [place] [address]    Culture (A)  Final    30,000 COLONIES/mL ENTEROCOCCUS FAECIUM SUSCEPTIBILITIES TO FOLLOW Performed at [facility], [place] [address] [place], [place] [address]    Report Status PENDING  Incomplete  MRSA PCR Screening     Status: Abnormal   Collection Time: 07/24/19  8:03 AM   Specimen: Nasopharyngeal  Result Value Ref Range Status   MRSA by PCR POSITIVE (A) NEGATIVE Final    Comment:        The GeneXpert MRSA Assay (FDA approved for NASAL specimens only), is one component of a comprehensive MRSA colonization surveillance program. It is not intended to diagnose MRSA infection nor to guide or monitor treatment for MRSA infections. RESULT CALLED TO, READ BACK BY AND  VERIFIED WITH: GORE,D. RN @1201  ON 05.11.2021 BY COHEN,K Performed at [facility], [place] [address], [place], [place] [address]     Radiology Studies: No results found.  Scheduled Meds: . sodium chloride   Intravenous Once  . amLODipine  5 mg Oral Daily  . Chlorhexidine Gluconate Cloth  6 each Topical Daily  . cholecalciferol  1,000 Units Oral Daily  . collagenase   Topical Daily  . feeding supplement (ENSURE ENLIVE)  237 mL Oral BID BM  . feeding supplement (PRO-STAT SUGAR FREE 64)  30 mL Oral BID  . mouth rinse  15 mL Mouth Rinse BID  . mirtazapine  7.5 mg Oral QHS  . mupirocin ointment  1 application Nasal BID  . pantoprazole  40 mg Oral Daily  . polyethylene glycol  17 g Oral Daily   Continuous Infusions: . sodium chloride    . sodium chloride 75 mL/hr at 07/25/19 1600  . ceFEPime (MAXIPIME) IV Stopped (07/26/19 7915)  . metronidazole Stopped (07/26/19 0021)  . vancomycin Stopped (07/25/19 1953)     LOS: 3 days   Time spent: 29 mins.More than 50% of that time was spent in counseling and/or coordination of care.   Jonel Weldon Marry Guan, MD [facility] P5/13/2021, 7:50 AM

## 2019-07-27 DIAGNOSIS — E440 Moderate protein-calorie malnutrition: Secondary | ICD-10-CM | POA: Insufficient documentation

## 2019-07-27 LAB — CBC WITH DIFFERENTIAL/PLATELET
Abs Immature Granulocytes: 0.03 10*3/uL (ref 0.00–0.07)
Basophils Absolute: 0.1 10*3/uL (ref 0.0–0.1)
Basophils Relative: 1 %
Eosinophils Absolute: 0.2 10*3/uL (ref 0.0–0.5)
Eosinophils Relative: 3 %
HCT: 25.9 % — ABNORMAL LOW (ref 39.0–52.0)
Hemoglobin: 7.9 g/dL — ABNORMAL LOW (ref 13.0–17.0)
Immature Granulocytes: 0 %
Lymphocytes Relative: 18 %
Lymphs Abs: 1.5 10*3/uL (ref 0.7–4.0)
MCH: 25.2 pg — ABNORMAL LOW (ref 26.0–34.0)
MCHC: 30.5 g/dL (ref 30.0–36.0)
MCV: 82.7 fL (ref 80.0–100.0)
Monocytes Absolute: 1.1 10*3/uL — ABNORMAL HIGH (ref 0.1–1.0)
Monocytes Relative: 13 %
Neutro Abs: 5.4 10*3/uL (ref 1.7–7.7)
Neutrophils Relative %: 65 %
Platelets: 399 10*3/uL (ref 150–400)
RBC: 3.13 MIL/uL — ABNORMAL LOW (ref 4.22–5.81)
RDW: 19 % — ABNORMAL HIGH (ref 11.5–15.5)
WBC: 8.3 10*3/uL (ref 4.0–10.5)
nRBC: 0 % (ref 0.0–0.2)

## 2019-07-27 LAB — BASIC METABOLIC PANEL
Anion gap: 7 (ref 5–15)
BUN: 33 mg/dL — ABNORMAL HIGH (ref 8–23)
CO2: 24 mmol/L (ref 22–32)
Calcium: 7.7 mg/dL — ABNORMAL LOW (ref 8.9–10.3)
Chloride: 110 mmol/L (ref 98–111)
Creatinine, Ser: 1.52 mg/dL — ABNORMAL HIGH (ref 0.61–1.24)
GFR calc Af Amer: 50 mL/min — ABNORMAL LOW (ref >60)
GFR calc non Af Amer: 43 mL/min — ABNORMAL LOW (ref >60)
Glucose, Bld: 93 mg/dL (ref 70–99)
Potassium: 3.7 mmol/L (ref 3.5–5.1)
Sodium: 141 mmol/L (ref 135–145)

## 2019-07-27 LAB — URINE CULTURE: Culture: 30000 — AB

## 2019-07-27 NOTE — Progress Notes (Addendum)
[facility])  Mr. Blitch is our current outpatient palliative care pt.  Family contacted our palliative care team to see if pt was eligible for [facility].  Spoke with dtr Crystal, they are interested in EOL care at this time and do not want to seek further treatment/SNF placement.   Will update TOC team that we are looking at BP eligibility.  Wallis Bamberg RN, BSN, CCRN [facility] Liaison   **Mr. Rafuse is eligible for [facility] and we have a bed for him today. Family updated, TOC manager updated.    RN staff, please call report to [phone]  Please fax d/c summary to [phone]  Will update TOC manager once necessary consents are completed so transportation can be arranged at that time  ** @ 215 pm, consents are complete and transportation can be arranged for Mr. Arriaga to go to [facility].  Updated TOC manager.

## 2019-07-27 NOTE — Progress Notes (Signed)
Report called to Quarry manager at [facility].

## 2019-07-27 NOTE — Plan of Care (Signed)
  Problem: Nutrition: Goal: Adequate nutrition will be maintained Outcome: Progressing   Problem: Elimination: Goal: Will not experience complications related to urinary retention Outcome: Progressing   

## 2019-07-27 NOTE — TOC Transition Note (Signed)
Transition of Care [facility]) - CM/SW Discharge Note   Patient Details  Name: Raymone Pembroke MRN: [id number] Date of Birth: Jan 21, 1941  [facility]) CM/SW Contact:  Clearance Coots, LCSW Phone Number: 07/27/2019, 3:46 PM   Clinical Narrative:    Patient will transfer to [facility]. today  PTAR arranged for transport.  D/C Summary [phone] DNR signed.  Daughter notified at bedside.    Final next level of care: Hospice Medical Facility Barriers to Discharge: Barriers Resolved   Patient Goals and CMS Choice Patient states their goals for this hospitalization and ongoing recovery are:: to go back home CMS Medicare.gov Compare Post Acute Care list provided to:: Patient    Discharge Placement                       Discharge Plan and Services   Discharge Planning Services: CM Consult                                 Social Determinants of Health (SDOH) Interventions     Readmission Risk Interventions Readmission Risk Prevention Plan 05/22/2019  Transportation Screening Complete  PCP or Specialist Appt within 3-5 Days Complete  HRI or Home Care Consult Complete  Social Work Consult for Recovery Care Planning/Counseling Complete  Palliative Care Screening Complete  Medication Review Oceanographer) Referral to Pharmacy

## 2019-07-27 NOTE — Care Management Important Message (Signed)
Important Message  Patient Details IM Letter given to [name] SW Case Manager to present to the Patient Name: Paul Suarez MRN: [id number] Date of Birth: Feb 11, 1941   Medicare Important Message Given:  Yes     Caren Macadam 07/27/2019, 11:29 AM

## 2019-07-27 NOTE — TOC Progression Note (Signed)
Transition of Care [facility]) - Progression Note    Patient Details  Name: Raffaele Derise MRN: [id number] Date of Birth: 05-31-1940  [facility]) CM/SW Contact  Clearance Coots, LCSW Phone Number: 07/27/2019, 11:29 AM  Clinical Narrative:    Patient daughter is agreeable to [facility] facility. CSW discussed the plan of care with  [facility] Rayman Petrosian. CSW will follow for discharge needs.    Expected Discharge Plan: Hospice Medical Facility Barriers to Discharge: Continued Medical Work up  Expected Discharge Plan and Services Expected Discharge Plan: Hospice Medical Facility   Discharge Planning Services: CM Consult   Living arrangements for the past 2 months: Single Family Home                                       Social Determinants of Health (SDOH) Interventions    Readmission Risk Interventions Readmission Risk Prevention Plan 05/22/2019  Transportation Screening Complete  PCP or Specialist Appt within 3-5 Days Complete  HRI or Home Care Consult Complete  Social Work Consult for Recovery Care Planning/Counseling Complete  Palliative Care Screening Complete  Medication Review Oceanographer) Referral to Pharmacy

## 2019-07-27 NOTE — Discharge Summary (Signed)
Discharge Summary  Paul Suarez IOE:[id number] DOB: Aug 30, 1940  PCP: Hendricks Limes, MD  Admit date: 07/23/2019 Discharge date: 07/27/2019   Recommendations for Outpatient Follow-up:  1. Per hospice providers  Discharge Diagnoses:  Active Hospital Problems   Diagnosis Date Noted  . Sepsis ([place]) 07/23/2019  . Malnutrition of moderate degree 07/27/2019  . COPD (chronic obstructive pulmonary disease) ([place]) 05/31/2019  . Aspiration pneumonia ([place]) 05/21/2019  . Anemia of chronic disease 05/21/2019  . Decubitus ulcers 05/08/2019  . AKI (acute kidney injury) ([place]) 05/02/2019  . Essential hypertension 02/12/2019    Resolved Hospital Problems  No resolved problems to display.    Discharge Condition: Stable   Diet recommendation: As tolerated  Vitals:   07/27/19 0629 07/27/19 1342  BP: 119/68 127/70  Pulse: (!) 103 (!) 101  Resp: 16 16  Temp: 98.7 F (37.1 C) 98.2 F (36.8 C)  SpO2: 97% 98%    HPI and Brief Hospital Course:  This is a 79 year old gentleman with a history of COPD, hypertension, coronary artery disease on home oxygen 5 L, history of ESBL UTI, Covid illness in February 2021, lung abscess, chronic aspiration syndrome and hiatal hernia currently under outpatient hospice care who was brought to the emergency department 5/10 due to worsening fever, cough, tachycardia and tachypnea.  Patient had been admitted in February for ESBL UTI was admitted in late March again for aspiration pneumonia.  Patient also has known sacral decubitus ulcers, lateral right foot decubitus, and is status post left BKA.  Chest x-ray showed persistent pneumonitis, right upper and lower lobe consolidation.  He was started on empiric broad-spectrum antibiotics.  Palliative care was also consulted for goals of care discussion.  The patient has also had some acute renal failure, which has been improving gradually with hydration.  His sepsis has resolved, most likely was from pulmonary source.  He was  transitioned to oral antibiotics.  However, after my discussion with the patient's daughter this morning, she discussed again with case management and hospice, decision was made to transfer the patient to inpatient hospice at [facility] this afternoon.  Discharge details, plan of care and follow up instructions were discussed with patient and any available family or care providers. Patient and family are in agreement with discharge from the hospital today and all questions were answered to their satisfaction.  Discharge Exam: BP 127/70 (BP Location: Left Arm)   Pulse (!) 101   Temp 98.2 F (36.8 C)   Resp 16   Ht 5\' 8"  (1.727 m)   Wt 60.5 kg   SpO2 98%   BMI 20.28 kg/m  See progress note this date.  Discharge Instructions You were cared for by a hospitalist during your hospital stay. If you have any questions about your discharge medications or the care you received while you were in the hospital after you are discharged, you can call the unit and asked to speak with the hospitalist on call if the hospitalist that took care of you is not available. Once you are discharged, your primary care physician will handle any further medical issues. Please note that NO REFILLS for any discharge medications will be authorized once you are discharged, as it is imperative that you return to your primary care physician (or establish a relationship with a primary care physician if you do not have one) for your aftercare needs so that they can reassess your need for medications and monitor your lab values.   Allergies as of 07/27/2019  Reactions   Keflex [cephalexin]    Allergy listed on MAR (no details), date unknown but has tolerated cefepime, Rocephin, Zosyn, and Augmentin in the past year. Last cefazolin was in Feb 2021.      Medication List    STOP taking these medications   acetaminophen 325 MG tablet Commonly known as: TYLENOL   albuterol (2.5 MG/3ML) 0.083% nebulizer solution Commonly  known as: PROVENTIL   bisacodyl 5 MG EC tablet Commonly known as: DULCOLAX   CEROVITE SENIOR PO   cholecalciferol 25 MCG (1000 UNIT) tablet Commonly known as: VITAMIN D3   diphenhydrAMINE 25 MG tablet Commonly known as: BENADRYL   feeding supplement (PRO-STAT SUGAR FREE 64) Liqd   HYDROmorphone 2 MG tablet Commonly known as: DILAUDID   LORazepam 0.5 MG tablet Commonly known as: ATIVAN   mirtazapine 15 MG tablet Commonly known as: REMERON   Mylanta Maximum Strength 400-400-40 MG/5ML suspension Generic drug: alum & mag hydroxide-simeth   Omeprazole 20 MG Tbec   ondansetron 4 MG tablet Commonly known as: ZOFRAN   oxymetazoline 0.05 % nasal spray Commonly known as: AFRIN   polyethylene glycol 17 g packet Commonly known as: MIRALAX / GLYCOLAX   vitamin C 500 MG tablet Commonly known as: ASCORBIC ACID   Zinc Sulfate 220 (50 Zn) MG Tabs      Allergies  Allergen Reactions  . Keflex [Cephalexin]     Allergy listed on MAR (no details), date unknown but has tolerated cefepime, Rocephin, Zosyn, and Augmentin in the past year. Last cefazolin was in Feb 2021.      The results of significant diagnostics from this hospitalization (including imaging, microbiology, ancillary and laboratory) are listed below for reference.    Significant Diagnostic Studies: DG Chest Port 1 View  Result Date: 07/23/2019 CLINICAL DATA:  Sepsis. EXAM: PORTABLE CHEST 1 VIEW COMPARISON:  June 04, 2019 FINDINGS: There are prominent interstitial lung markings bilaterally. There is a persistent hazy airspace opacity in the right mid lung zone. There is a small right-sided pleural effusion. The heart size is stable. There is no pneumothorax. No acute osseous abnormality. IMPRESSION: 1. Persistent findings of pneumonitis, especially on the right. 2. Persistent but improved right upper and right lower lobe consolidation. 3. Small bilateral pleural effusions, right greater than left. Electronically Signed    By: Katherine Mantle M.D.   On: 07/23/2019 16:36    Microbiology: Recent Results (from the past 240 hour(s))  Blood Culture (routine x 2)     Status: None (Preliminary result)   Collection Time: 07/23/19  3:20 PM   Specimen: BLOOD LEFT HAND  Result Value Ref Range Status   Specimen Description   Final    BLOOD LEFT HAND Performed at [facility], [address], [place], [place] [address]    Special Requests   Final    BOTTLES DRAWN AEROBIC AND ANAEROBIC Blood Culture adequate volume Performed at [facility], [address] [place], [place] [address]    Culture   Final    NO GROWTH 4 DAYS Performed at [facility], [address], [place], [place] [address]    Report Status PENDING  Incomplete  Blood Culture (routine x 2)     Status: None (Preliminary result)   Collection Time: 07/23/19  4:15 PM   Specimen: BLOOD RIGHT HAND  Result Value Ref Range Status   Specimen Description   Final    BLOOD RIGHT HAND Performed at [facility], [address] [place], [place] [address]    Special Requests   Final    BOTTLES DRAWN AEROBIC AND ANAEROBIC Blood Culture adequate volume Performed at [facility], [address], [place], [place] [address]    Culture   Final    NO GROWTH 4 DAYS Performed at [facility], [address], [place], [place] [address]    Report Status PENDING  Incomplete  Urine culture     Status: Abnormal   Collection Time: 07/24/19  8:03 AM   Specimen: In/Out Cath Urine  Result Value Ref Range Status   Specimen Description   Final    IN/OUT CATH URINE Performed at [facility], [address] [place], [place] [address]    Special Requests   Final    NONE Performed at [facility], [address] [place], [place] [address]    Culture (A)  Final    30,000 COLONIES/mL VANCOMYCIN RESISTANT ENTEROCOCCUS ISOLATED   Report  Status 07/27/2019 FINAL  Final   Organism ID, Bacteria VANCOMYCIN RESISTANT ENTEROCOCCUS ISOLATED (A)  Final      Susceptibility   Vancomycin resistant enterococcus isolated - MIC*    AMPICILLIN >=32 RESISTANT Resistant     NITROFURANTOIN 256 RESISTANT Resistant     VANCOMYCIN >=32 RESISTANT Resistant     LINEZOLID 2 SENSITIVE Sensitive     * 30,000 COLONIES/mL VANCOMYCIN RESISTANT ENTEROCOCCUS ISOLATED  MRSA PCR Screening     Status: Abnormal   Collection Time: 07/24/19  8:03 AM   Specimen: Nasopharyngeal  Result Value Ref Range Status   MRSA by PCR POSITIVE (A) NEGATIVE Final    Comment:        The GeneXpert MRSA Assay (FDA approved for NASAL specimens only), is one component of a comprehensive MRSA colonization surveillance program. It is not intended to diagnose MRSA infection nor to guide or monitor treatment for MRSA infections. RESULT CALLED TO, READ BACK BY AND VERIFIED WITH: GORE,D. RN @1201  ON 05.11.2021 BY COHEN,K Performed at [facility], [address] [place], [place]      Labs: Basic Metabolic Panel: Recent Labs  Lab 07/23/19 1524 07/24/19 0544 07/25/19 0222 07/26/19 0215 07/27/19 0452  NA 145 145 139 139 141  K 4.1 3.7 3.9 3.8 3.7  CL 109 112* 106 108 110  CO2 25 25 23 23 24   GLUCOSE 177* 102* 110* 94 93  BUN 37* 38* 36* 35* 33*  CREATININE 1.85* 1.70* 1.65* 1.61* 1.52*  CALCIUM 7.9* 7.9* 7.4* 7.5* 7.7*   Liver Function Tests: Recent Labs  Lab 07/23/19 1524 07/24/19 0544  AST 22 20  ALT 14 15  ALKPHOS 57 50  BILITOT 0.4 0.5  PROT 6.0* 6.0*  ALBUMIN 2.0* 2.1*   No results for input(s): LIPASE, AMYLASE in the last 168 hours. No results for input(s): AMMONIA in the last 168 hours. CBC: Recent Labs  Lab 07/23/19 1524 07/24/19 0544 07/25/19 0222 07/26/19 0215 07/27/19 0452  WBC 10.4 11.6* 11.4* 10.7* 8.3  NEUTROABS 7.3  --  8.0* 7.5 5.4  HGB 7.2* 8.2* 8.0* 8.0* 7.9*  HCT 24.1* 25.9* 25.4* 25.6* 25.9*    MCV 80.6 80.2 80.4 82.3 82.7  PLT 482* 454* 404* 368 399   Cardiac Enzymes: No results for input(s): CKTOTAL, CKMB, CKMBINDEX, TROPONINI in the last 168 hours. BNP: BNP (last 3 results) Recent Labs    05/21/19 0736  BNP 77.0    ProBNP (last 3 results) No  results for input(s): PROBNP in the last 8760 hours.  CBG: No results for input(s): GLUCAP in the last 168 hours.  Time spent: 55 minutes were spent in preparing this discharge including medication reconciliation, counseling, and coordination of care.  Signed:  Adeola Dennen Vergie Living, MD  [facility] 07/27/2019, 3:14 PM

## 2019-07-27 NOTE — Progress Notes (Signed)
PROGRESS NOTE  Daemyn Gariepy  JXB:[id number] DOB: 1940/07/14 DOA: 07/23/2019 PCP: Hendricks Limes, MD   Brief Narrative: Patient is a 79 year old male with history of COPD, hypertension, coronary artery disease, on home oxygen, history of ESBL UTI, Covid illness in February, lung abscess, aspiration syndrome, hiatal hernia who is currently under hospice care who and was brought to the emergency department from skilled nursing facility for the evaluation of worsening cough, fever, tachycardia, tachypnea.  Symptoms started 3 days ago and gradually worsening.  Also history of congestion, chills, dysuria.  He has been admitted several times recently.  He was admitted in February for ESBL UTI and was admitted in late March for aspiration pneumonia and was ultimately discharged with palliative care follow-up.  Patient also has sacral decubitus, lateral right foot decubitus.  Chest x-ray showed persistent pneumonitis, right upper and lower lobe consolidation.  Started on broad-spectrum antibiotics, culture sent.  Palliative care also consulted for goals of care discussion.  Assessment & Plan:  Principal Problem:   Sepsis ([place]) Active Problems:   Essential hypertension   AKI (acute kidney injury) ([place])   Decubitus ulcers   Aspiration pneumonia (H[facility])   Anemia of chronic disease   COPD (chronic obstructive pulmonary disease) (HCC)   Malnutrition of moderate degree  Sepsis, present on admission: Most likely secondary to recurrent aspiration pneumonia causing pneumonitis and possible infected decubitus ulcers .  His aspiration pneumonia in the past was attributed to hiatal hernia.  Chest x-ray showed persistent pneumonitis, right upper and lower lobe consolidation.  Also has decubitus ulcers.  Started on broad spectrum antibiotics, continue for now. Continue aspiration precaution.  Follow-up cultures.  Patient on dysphagia 3 diet. Speech is following. He has history of ESBL UTI.  UCx with VRE but only 30K  colonies. Negative procalcitonin.  Continue current antibiotics changed to Augmentin 5/13. Cultures have not shown any growth.   Aspiration pneumonia: Chest x-ray finding as above.  Currently on 5 L of oxygen per minute, this is his baseline of late. Antibiotics narrowed 5/13 to Augmentin.  He was not in any kind of respiratory distress this morning.  AKI: Most likely prerenal secondary to decreased oral intake, dehydration, continue IV fluids.  Monitor BMP, renal function is improving.  Avoid nephrotoxins. Baseline kidney function is normal.  Normocytic anemia: Chronic, associated with chronic medical conditions and probably chronic slow bleed from the sacral ulcers.  No active blood loss.  He was  transfused with 1 unit of PRBC.  Monitor CBC.  His hemoglobin ranges from 7-9.  Iron studies showed severe iron deficiency of 14.  We will give him a dose of IV iron.  COPD: On home oxygen.  Currently not in exacerbation.  Continue bronchodilators  Decubitus ulcers: Present on admission.  Unstageable. wound care consulted.  Continue antibiotics.  Follow-up wound cultures.  Hypertension: Normotensive today,have resumed his home antihypertensives.  Goals of care: DNR.  Patient is being followed by hospice as an outpatient. Being followed by palliative care for goals of care discussion .  Pressure ulcers: Present on admission  Pressure Injury 05/08/19 Sacrum Left Deep Tissue Pressure Injury - Purple or maroon localized area of discolored intact skin or blood-filled blister due to damage of underlying soft tissue from pressure and/or shear. (Active)  05/08/19 1140  Location: Sacrum  Location Orientation: Left  Staging: Deep Tissue Pressure Injury - Purple or maroon localized area of discolored intact skin or blood-filled blister due to damage of underlying soft tissue from pressure and/or shear. (  per [place] nurse)  Wound Description (Comments):   Present on Admission: Yes (found on nightshift, none  noted on admission )     Pressure Injury 06/01/19 Heel Anterior;Right Unstageable - Full thickness tissue loss in which the base of the injury is covered by slough (yellow, tan, gray, green or brown) and/or eschar (tan, brown or black) in the wound bed. (Active)  06/01/19 0800  Location: Heel  Location Orientation: Anterior;Right  Staging: Unstageable - Full thickness tissue loss in which the base of the injury is covered by slough (yellow, tan, gray, green or brown) and/or eschar (tan, brown or black) in the wound bed. (per [place])  Wound Description (Comments):   Present on Admission: Yes     Pressure Injury 06/03/19 Hip Right;Lateral Stage 2 -  Partial thickness loss of dermis presenting as a shallow open injury with a red, pink wound bed without slough. (Active)  06/03/19 1500  Location: Hip  Location Orientation: Right;Lateral  Staging: Stage 2 -  Partial thickness loss of dermis presenting as a shallow open injury with a red, pink wound bed without slough. (per [place] nurse)  Wound Description (Comments):   Present on Admission: Yes     Pressure Injury 06/03/19 Ischial tuberosity Right;Medial Stage 2 -  Partial thickness loss of dermis presenting as a shallow open injury with a red, pink wound bed without slough. (Active)  06/03/19 1500  Location: Ischial tuberosity  Location Orientation: Right;Medial  Staging: Stage 2 -  Partial thickness loss of dermis presenting as a shallow open injury with a red, pink wound bed without slough. (per [place] nurse)  Wound Description (Comments):   Present on Admission: Yes     Pressure Injury 06/03/19 Other (Comment) Left;Anterior Stage 2 -  Partial thickness loss of dermis presenting as a shallow open injury with a red, pink wound bed without slough. (Active)  06/03/19 1500  Location: Other (Comment) (BKA stump)  Location Orientation: Left;Anterior  Staging: Stage 2 -  Partial thickness loss of dermis presenting as a shallow open injury with a  red, pink wound bed without slough. (per [place])  Wound Description (Comments):   Present on Admission: Yes     Pressure Injury 07/24/19 Ischial tuberosity Left Unstageable - Full thickness tissue loss in which the base of the injury is covered by slough (yellow, tan, gray, green or brown) and/or eschar (tan, brown or black) in the wound bed. (Active)  07/24/19   Location: Ischial tuberosity  Location Orientation: Left  Staging: Unstageable - Full thickness tissue loss in which the base of the injury is covered by slough (yellow, tan, gray, green or brown) and/or eschar (tan, brown or black) in the wound bed.  Wound Description (Comments):   Present on Admission: Yes     Pressure Injury 07/24/19 Ischial tuberosity Right Unstageable - Full thickness tissue loss in which the base of the injury is covered by slough (yellow, tan, gray, green or brown) and/or eschar (tan, brown or black) in the wound bed. (Active)  07/24/19   Location: Ischial tuberosity  Location Orientation: Right  Staging: Unstageable - Full thickness tissue loss in which the base of the injury is covered by slough (yellow, tan, gray, green or brown) and/or eschar (tan, brown or black) in the wound bed.  Wound Description (Comments):   Present on Admission: Yes     Pressure Injury 07/24/19 Ischial tuberosity Lower;Right Unstageable - Full thickness tissue loss in which the base of the injury is covered by slough (  yellow, tan, gray, green or brown) and/or eschar (tan, brown or black) in the wound bed. (Active)  07/24/19   Location: Ischial tuberosity  Location Orientation: Lower;Right  Staging: Unstageable - Full thickness tissue loss in which the base of the injury is covered by slough (yellow, tan, gray, green or brown) and/or eschar (tan, brown or black) in the wound bed.  Wound Description (Comments):   Present on Admission: Yes     Pressure Injury 07/24/19 Ankle Right;Lateral Unstageable - Full thickness tissue loss  in which the base of the injury is covered by slough (yellow, tan, gray, green or brown) and/or eschar (tan, brown or black) in the wound bed. (Active)  07/24/19   Location: Ankle  Location Orientation: Right;Lateral  Staging: Unstageable - Full thickness tissue loss in which the base of the injury is covered by slough (yellow, tan, gray, green or brown) and/or eschar (tan, brown or black) in the wound bed.  Wound Description (Comments):   Present on Admission: Yes     Pressure Injury 07/24/19 Heel Right Unstageable - Full thickness tissue loss in which the base of the injury is covered by slough (yellow, tan, gray, green or brown) and/or eschar (tan, brown or black) in the wound bed. (Active)  07/24/19   Location: Heel  Location Orientation: Right  Staging: Unstageable - Full thickness tissue loss in which the base of the injury is covered by slough (yellow, tan, gray, green or brown) and/or eschar (tan, brown or black) in the wound bed.  Wound Description (Comments):   Present on Admission: Yes     Pressure Injury 07/24/19 Sacrum Unstageable - Full thickness tissue loss in which the base of the injury is covered by slough (yellow, tan, gray, green or brown) and/or eschar (tan, brown or black) in the wound bed. (Active)  07/24/19   Location: Sacrum  Location Orientation:   Staging: Unstageable - Full thickness tissue loss in which the base of the injury is covered by slough (yellow, tan, gray, green or brown) and/or eschar (tan, brown or black) in the wound bed.  Wound Description (Comments):   Present on Admission: Yes     Pressure Injury 07/24/19 Foot Right;Lateral Unstageable - Full thickness tissue loss in which the base of the injury is covered by slough (yellow, tan, gray, green or brown) and/or eschar (tan, brown or black) in the wound bed. 3 locations of unstageable (Active)  07/24/19   Location: Foot  Location Orientation: Right;Lateral  Staging: Unstageable - Full thickness  tissue loss in which the base of the injury is covered by slough (yellow, tan, gray, green or brown) and/or eschar (tan, brown or black) in the wound bed.  Wound Description (Comments): 3 locations of unstageable wounds  Present on Admission: Yes   Nutrition Problem: Moderate Malnutrition Etiology: chronic illness    DVT prophylaxis:SCD Code Status: DNR Family Communication: Called and discussed with daughter on phone on 07/27/2019. Status is: Inpatient  Remains inpatient appropriate because:Hemodynamically unstable, AKI.  Dispo: The patient is from: SNF              Anticipated d/c is to: SNF, daughter interested on a different nursing facility              Anticipated d/c date is: 1-2 days              Patient currently is not medically stable to d/c.  Needs IV antibiotics,IV fluids for now.Cultures should be followed before DC  Consultants: None  Procedures:None  Antimicrobials:  Anti-infectives (From admission, onward)   Start     Dose/Rate Route Frequency Ordered Stop   07/26/19 2200  amoxicillin-clavulanate (AUGMENTIN) 875-125 MG per tablet 1 tablet     1 tablet Oral Every 12 hours 07/26/19 1534     07/24/19 1800  vancomycin (VANCOCIN) IVPB 1000 mg/200 mL premix  Status:  Discontinued     1,000 mg 200 mL/hr over 60 Minutes Intravenous Every 24 hours 07/23/19 2020 07/26/19 1534   07/24/19 0600  ceFEPIme (MAXIPIME) 2 g in sodium chloride 0.9 % 100 mL IVPB  Status:  Discontinued     2 g 200 mL/hr over 30 Minutes Intravenous Every 12 hours 07/23/19 2024 07/26/19 1534   07/24/19 0000  metroNIDAZOLE (FLAGYL) IVPB 500 mg  Status:  Discontinued     500 mg 100 mL/hr over 60 Minutes Intravenous Every 8 hours 07/23/19 2006 07/26/19 1534   07/23/19 1615  vancomycin (VANCOREADY) IVPB 1250 mg/250 mL     1,250 mg 166.7 mL/hr over 90 Minutes Intravenous  Once 07/23/19 1600 07/23/19 2020   07/23/19 1600  ceFEPIme (MAXIPIME) 2 g in sodium chloride 0.9 % 100 mL IVPB     2 g 200 mL/hr  over 30 Minutes Intravenous  Once 07/23/19 1556 07/23/19 1827   07/23/19 1600  metroNIDAZOLE (FLAGYL) IVPB 500 mg     500 mg 100 mL/hr over 60 Minutes Intravenous  Once 07/23/19 1556 07/23/19 1714   07/23/19 1600  vancomycin (VANCOCIN) IVPB 1000 mg/200 mL premix  Status:  Discontinued     1,000 mg 200 mL/hr over 60 Minutes Intravenous  Once 07/23/19 1556 07/23/19 1600      Subjective: Patient seen and examined at the bedside this morning.  Hemodynamically stable.  Looks chronically ill but comfortable and denies any complaints.  Objective: Vitals:   07/26/19 1029 07/26/19 1327 07/26/19 2224 07/27/19 0629  BP: 121/65 117/65 127/65 119/68  Pulse: (!) 107 (!) 107 (!) 110 (!) 103  Resp: 18 18 18 16   Temp: 98.2 F (36.8 C) 98.2 F (36.8 C)  98.7 F (37.1 C)  TempSrc:    Oral  SpO2: 93% 100% 97% 97%  Weight:      Height:        Intake/Output Summary (Last 24 hours) at 07/27/2019 1043 Last data filed at 07/27/2019 07/29/2019 Gross per 24 hour  Intake 1483.48 ml  Output 215 ml  Net 1268.48 ml   Filed Weights   07/24/19 0829  Weight: 60.5 kg    Examination:  General exam: Deconditioned, debilitated, chronically looking elderly male Respiratory system: Decreased air entry on the right lung, mild crackles on the right lung cardiovascular system: S1 & S2 heard, RRR. No JVD, murmurs, rubs, gallops or clicks. Gastrointestinal system: Abdomen is nondistended, soft and nontender. No organomegaly or masses felt. Normal bowel sounds heard. Central nervous system: Alert and awake, oriented to place and person Extremities: Left BKA Skin: Unstageable pressure ulcers on the buttocks, sacrum, right foot ankle/heel   Data Reviewed: I have personally reviewed following labs and imaging studies  CBC: Recent Labs  Lab 07/23/19 1524 07/24/19 0544 07/25/19 0222 07/26/19 0215 07/27/19 0452  WBC 10.4 11.6* 11.4* 10.7* 8.3  NEUTROABS 7.3  --  8.0* 7.5 5.4  HGB 7.2* 8.2* 8.0* 8.0* 7.9*  HCT  24.1* 25.9* 25.4* 25.6* 25.9*  MCV 80.6 80.2 80.4 82.3 82.7  PLT 482* 454* 404* 368 399   Basic Metabolic Panel: Recent Labs  Lab 07/23/19 1524 07/24/19 0544  07/25/19 0222 07/26/19 0215 07/27/19 0452  NA 145 145 139 139 141  K 4.1 3.7 3.9 3.8 3.7  CL 109 112* 106 108 110  CO2 25 25 23 23 24   GLUCOSE 177* 102* 110* 94 93  BUN 37* 38* 36* 35* 33*  CREATININE 1.85* 1.70* 1.65* 1.61* 1.52*  CALCIUM 7.9* 7.9* 7.4* 7.5* 7.7*   GFR: Estimated Creatinine Clearance: 34.3 mL/min (A) (by C-G formula based on SCr of 1.52 mg/dL (H)). Liver Function Tests: Recent Labs  Lab 07/23/19 1524 07/24/19 0544  AST 22 20  ALT 14 15  ALKPHOS 57 50  BILITOT 0.4 0.5  PROT 6.0* 6.0*  ALBUMIN 2.0* 2.1*   No results for input(s): LIPASE, AMYLASE in the last 168 hours. No results for input(s): AMMONIA in the last 168 hours. Coagulation Profile: Recent Labs  Lab 07/23/19 1520 07/24/19 0544  INR 1.1 1.2   Cardiac Enzymes: No results for input(s): CKTOTAL, CKMB, CKMBINDEX, TROPONINI in the last 168 hours. BNP (last 3 results) No results for input(s): PROBNP in the last 8760 hours. HbA1C: No results for input(s): HGBA1C in the last 72 hours. CBG: No results for input(s): GLUCAP in the last 168 hours. Lipid Profile: No results for input(s): CHOL, HDL, LDLCALC, TRIG, CHOLHDL, LDLDIRECT in the last 72 hours. Thyroid Function Tests: No results for input(s): TSH, T4TOTAL, FREET4, T3FREE, THYROIDAB in the last 72 hours. Anemia Panel: Recent Labs    07/24/19 1520  FERRITIN 118  TIBC 185*  IRON 14*   Sepsis Labs: Recent Labs  Lab 07/23/19 1520 07/23/19 1730 07/24/19 0544  PROCALCITON  --   --  <0.10  LATICACIDVEN 2.7* 0.7  --     Recent Results (from the past 240 hour(s))  Blood Culture (routine x 2)     Status: None (Preliminary result)   Collection Time: 07/23/19  3:20 PM   Specimen: BLOOD LEFT HAND  Result Value Ref Range Status   Specimen Description   Final    BLOOD LEFT  HAND Performed at [facility], [address], [place], [place]    Special Requests   Final    BOTTLES DRAWN AEROBIC AND ANAEROBIC Blood Culture adequate volume Performed at [facility], [address] [place], [place]    Culture   Final    NO GROWTH 3 DAYS Performed at [facility], [address] [place], [place]    Report Status PENDING  Incomplete  Blood Culture (routine x 2)     Status: None (Preliminary result)   Collection Time: 07/23/19  4:15 PM   Specimen: BLOOD RIGHT HAND  Result Value Ref Range Status   Specimen Description   Final    BLOOD RIGHT HAND Performed at [facility], [address] [place], [place]    Special Requests   Final    BOTTLES DRAWN AEROBIC AND ANAEROBIC Blood Culture adequate volume Performed at [facility], [address], [place], [place]    Culture   Final    NO GROWTH 3 DAYS Performed at [facility], [address] [place], [place]    Report Status PENDING  Incomplete  Urine culture     Status: Abnormal   Collection Time: 07/24/19  8:03 AM   Specimen: In/Out Cath Urine  Result Value Ref Range Status   Specimen Description   Final    IN/OUT CATH URINE Performed at [facility], [address], [place], [place] [address]    Special Requests   Final    NONE Performed at [facility], [address] [place], [place] [address]    Culture (A)  Final    30,000 COLONIES/mL VANCOMYCIN RESISTANT ENTEROCOCCUS ISOLATED   Report Status 07/27/2019 FINAL  Final   Organism ID, Bacteria VANCOMYCIN RESISTANT ENTEROCOCCUS ISOLATED (A)  Final      Susceptibility   Vancomycin resistant enterococcus isolated - MIC*    AMPICILLIN >=32 RESISTANT Resistant     NITROFURANTOIN 256 RESISTANT Resistant     VANCOMYCIN >=32 RESISTANT Resistant     LINEZOLID 2 SENSITIVE  Sensitive     * 30,000 COLONIES/mL VANCOMYCIN RESISTANT ENTEROCOCCUS ISOLATED  MRSA PCR Screening     Status: Abnormal   Collection Time: 07/24/19  8:03 AM   Specimen: Nasopharyngeal  Result Value Ref Range Status   MRSA by PCR POSITIVE (A) NEGATIVE Final    Comment:        The GeneXpert MRSA Assay (FDA approved for NASAL specimens only), is one component of a comprehensive MRSA colonization surveillance program. It is not intended to diagnose MRSA infection nor to guide or monitor treatment for MRSA infections. RESULT CALLED TO, READ BACK BY AND VERIFIED WITH: GORE,D. RN @1201  ON 05.11.2021 BY COHEN,K Performed at [facility], [address], [place], [place]     Radiology Studies: No results found.  Scheduled Meds: . amLODipine  5 mg Oral Daily  . amoxicillin-clavulanate  1 tablet Oral Q12H  . Chlorhexidine Gluconate Cloth  6 each Topical Daily  . cholecalciferol  1,000 Units Oral Daily  . collagenase   Topical Daily  . feeding supplement (ENSURE ENLIVE)  237 mL Oral BID BM  . feeding supplement (PRO-STAT SUGAR FREE 64)  30 mL Oral BID  . mouth rinse  15 mL Mouth Rinse BID  . mirtazapine  7.5 mg Oral QHS  . mupirocin ointment  1 application Nasal BID  . pantoprazole  40 mg Oral Daily  . polyethylene glycol  17 g Oral Daily   Continuous Infusions: . sodium chloride 75 mL/hr at 07/26/19 1759     LOS: 4 days   Time spent: 29 mins.More than 50% of that time was spent in counseling and/or coordination of care.   Sheana Bir 07/28/19, MD [facility] P5/14/2021, 10:43 AM

## 2019-07-27 NOTE — Progress Notes (Addendum)
PT Cancellation Note  Patient Details Name: Paul Suarez MRN: [id number] DOB: Dec 09, 1940   Cancelled Treatment:    Reason Eval/Treat Not Completed: Medical issues which prohibited therapy. CX[facility] PT eval; patient transferring to hospice today per social worker, [name].   Katina Dung. Hartnett-Rands, MS, PT Per [place] PT [facility] #[id number] 07/27/2019, 1:07 PM

## 2019-07-28 LAB — CULTURE, BLOOD (ROUTINE X 2)
Culture: NO GROWTH
Culture: NO GROWTH
Special Requests: ADEQUATE
Special Requests: ADEQUATE

## 2019-07-31 ENCOUNTER — Encounter (HOSPITAL_BASED_OUTPATIENT_CLINIC_OR_DEPARTMENT_OTHER): Payer: Medicare Other | Admitting: Internal Medicine

## 2019-08-08 ENCOUNTER — Encounter: Payer: Medicare Other | Admitting: Vascular Surgery

## 2019-08-14 DEATH — deceased

## 2019-08-22 ENCOUNTER — Encounter: Payer: Self-pay | Admitting: Surgery

## 2019-09-13 ENCOUNTER — Encounter: Payer: Self-pay | Admitting: Surgery

## 2021-05-26 IMAGING — CT CT ANGIO CHEST
2 of 6 series · 18 of 36 positions shown · IV contrast (omnipaque)
Comparison: Chest x-ray earlier same day

CLINICAL DATA: Hemoptysis.

EXAM:
CT ANGIOGRAPHY CHEST WITH CONTRAST
TECHNIQUE: Multidetector CT imaging of the chest was performed using the
standard protocol during bolus administration of intravenous
contrast. Multiplanar CT image reconstructions and MIPs were
obtained to evaluate the vascular anatomy.
CONTRAST:  100mL OMNIPAQUE IOHEXOL 350 MG/ML SOLN

[Series 6: thins · axial · 0.70mm/px · z∈[+1456,+1694]mm · 17 of 267 slices shown]
[im 15/267  lung]
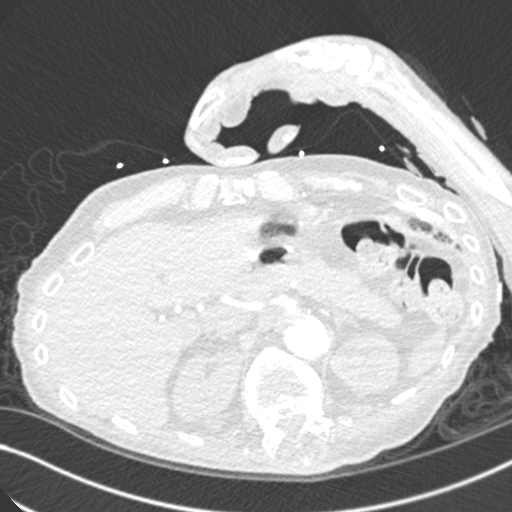
[im 30/267  mediastinal]
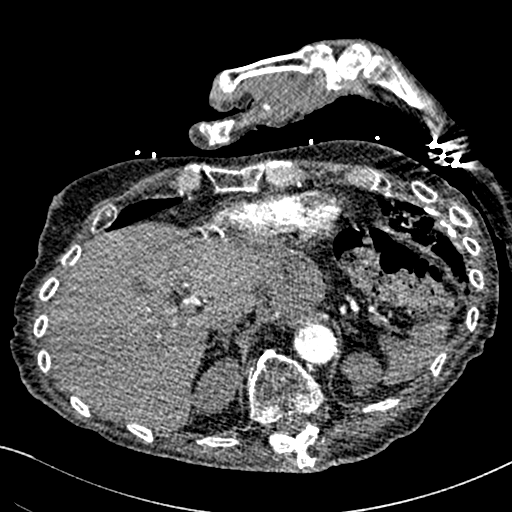
[im 45/267  lung]
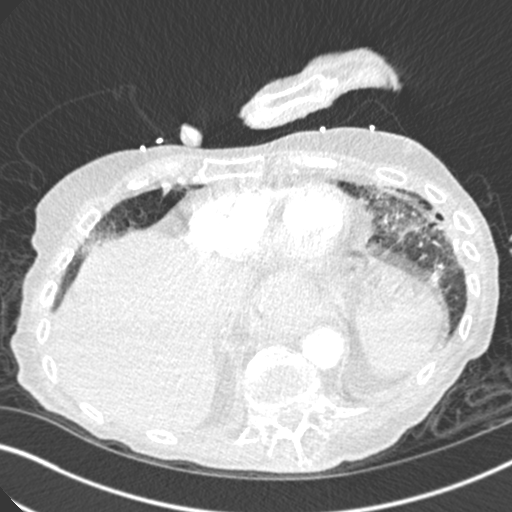
[im 60/267  mediastinal]
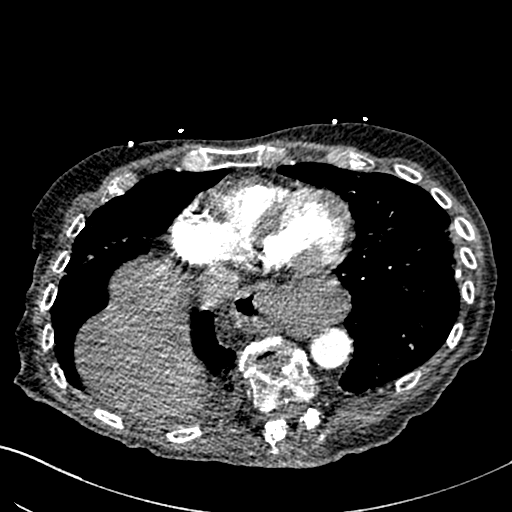
[im 74/267  lung]
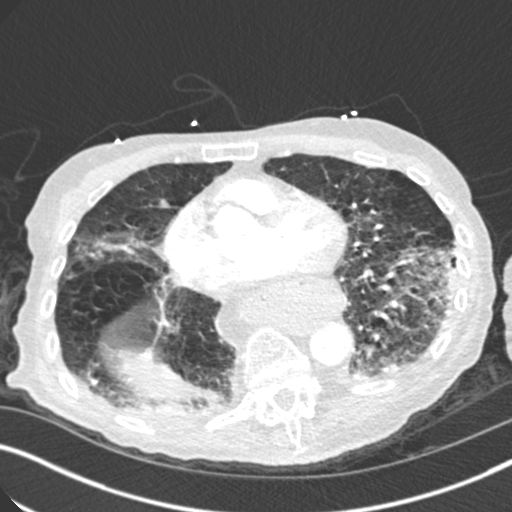
[im 89/267  mediastinal]
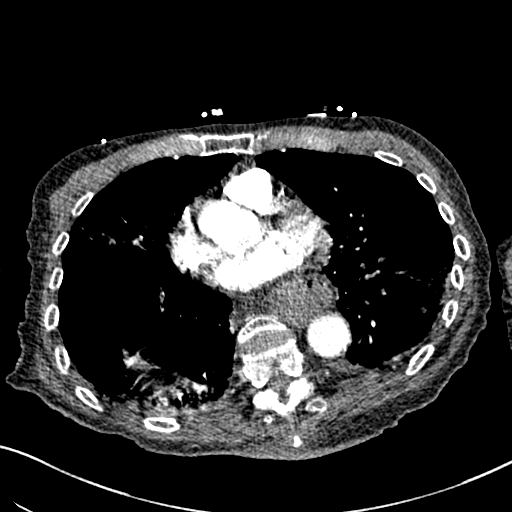
[im 104/267  lung]
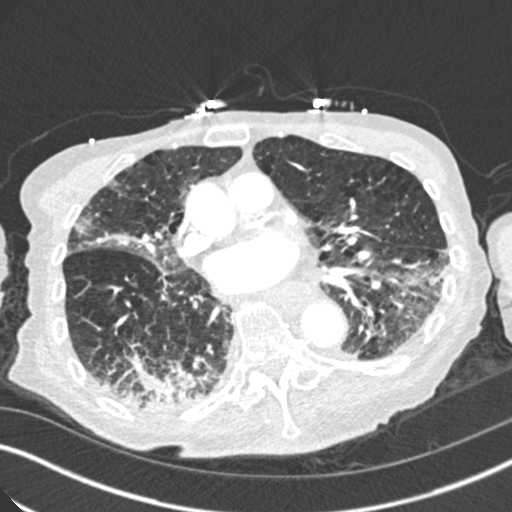
[im 119/267  mediastinal]
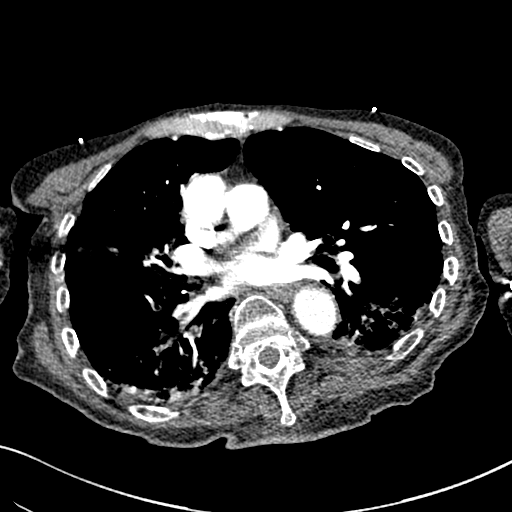
[im 134/267  lung]
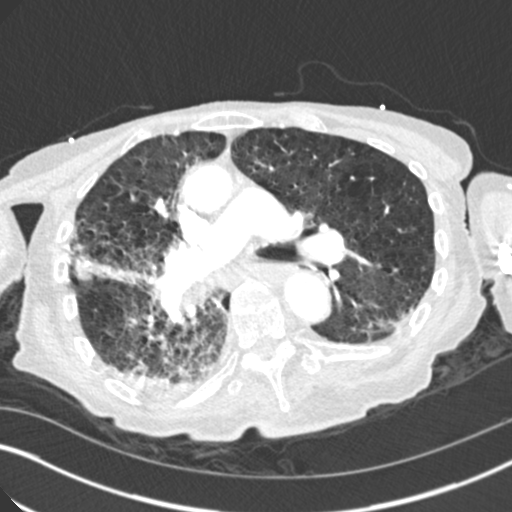
[im 148/267  mediastinal]
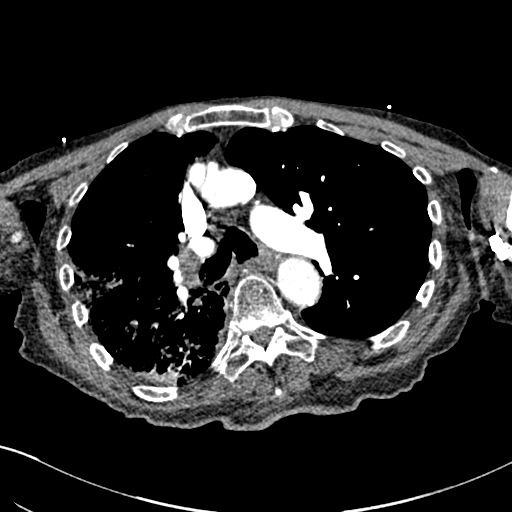
[im 163/267  lung]
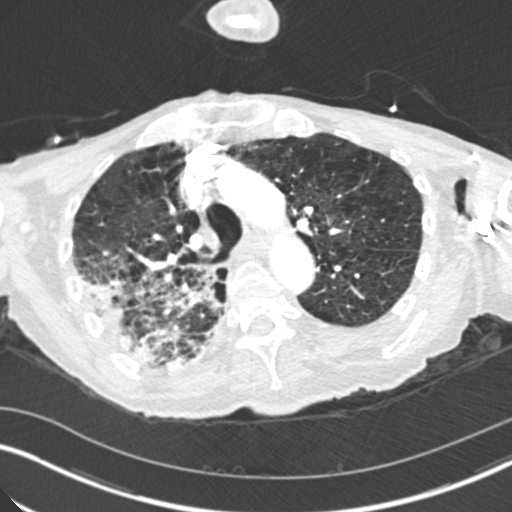
[im 178/267  mediastinal]
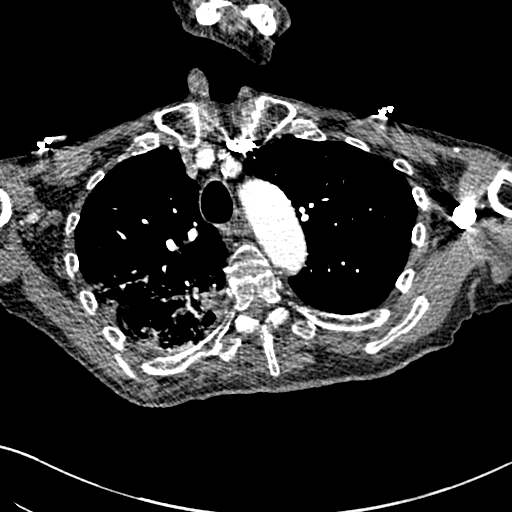
[im 193/267  lung]
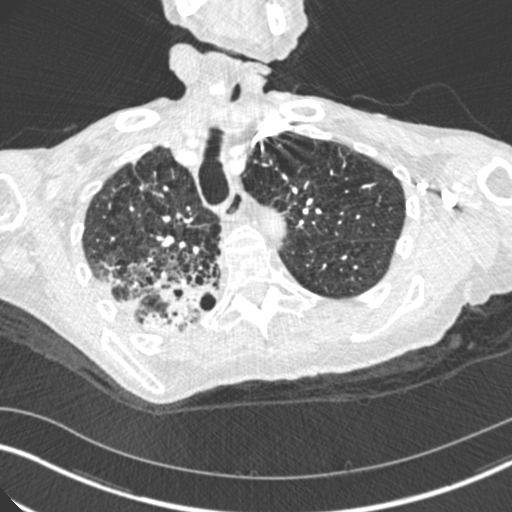
[im 207/267  mediastinal]
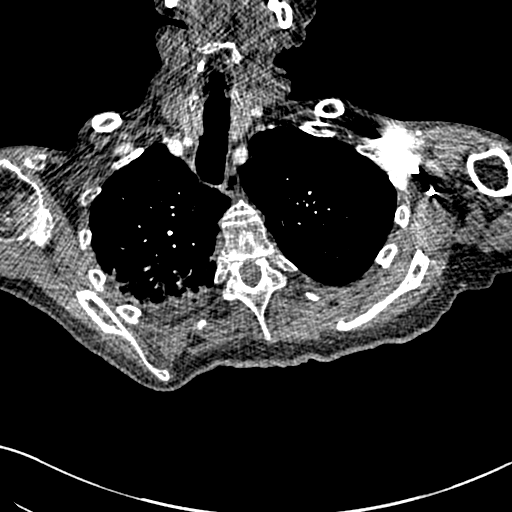
[im 222/267  lung]
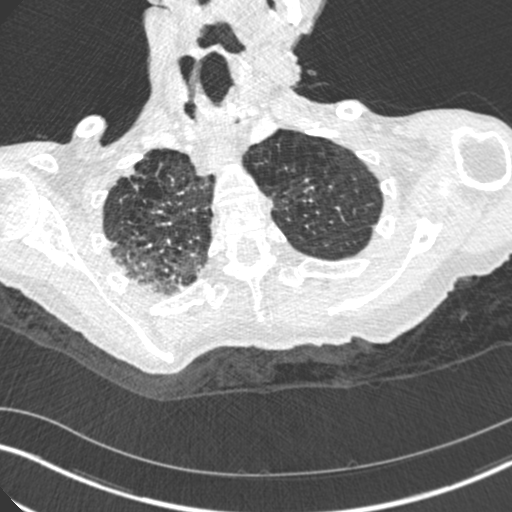
[im 237/267  mediastinal]
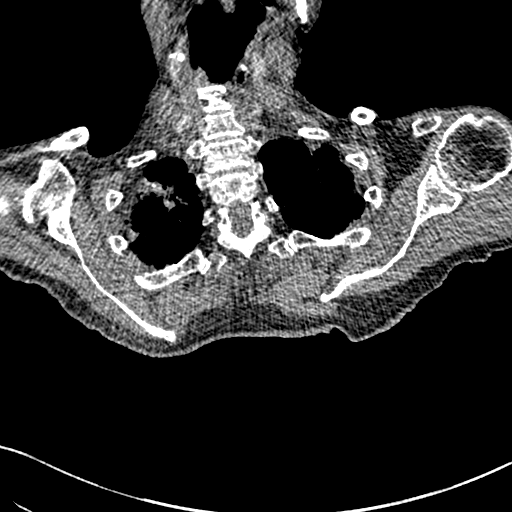
[im 252/267  lung]
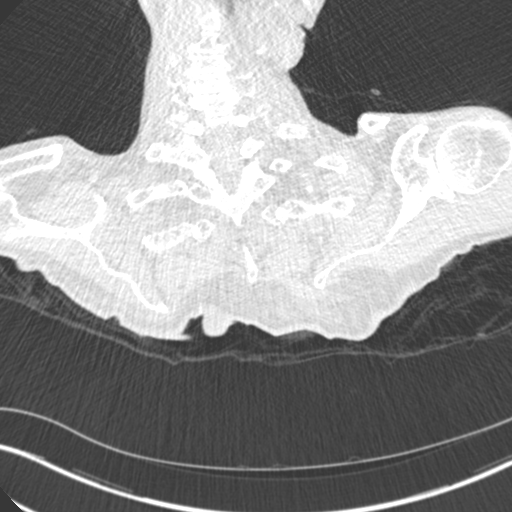

[Series 8: coronal mpr · coronal · 0.61mm/px · 1 of 132 slices shown]
[im 66/132  mediastinal]
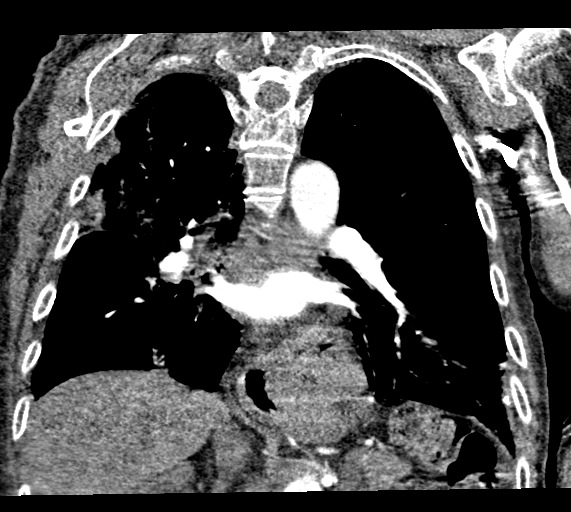

[18 of 36 positions shown; findings below may reference images not displayed]

FINDINGS: Cardiovascular: The heart size is normal. No substantial pericardial
effusion. Coronary artery calcification is evident. Atherosclerotic
calcification is noted in the wall of the thoracic aorta. No large
central pulmonary embolus. No evidence for upper lobe pulmonary
embolic disease. Assessment of lower lobes is markedly degraded by
breathing motion making assessment of lower lobe segmental and
subsegmental pulmonary arteries unreliable.

Mediastinum/Nodes: 13 mm short axis right hilar node is associated
with a 18 mm short axis subcarinal lymph node. No left hilar
lymphadenopathy. Moderate to large hiatal hernia. There is no
axillary lymphadenopathy.

Lungs/Pleura: Centrilobular and paraseptal emphysema evident.
Pleural scarring noted anterior right hemithorax at the base
potentially from prior empyema or hemothorax. Patchy and areas of
confluent airspace disease noted posterior right upper lobe,
posterior right lower lobe, and posterior left lower lobe. Airspace
disease has a peripheral predominance. No pleural effusion. Biapical
pleuroparenchymal scarring noted.

Upper Abdomen: Small hypoattenuating lesions in the liver cannot be
definitively characterized but approach water density and are likely
cysts. Probable cysts noted posterior interpolar right kidney and
upper pole left kidney

Musculoskeletal: No worrisome lytic or sclerotic osseous
abnormality. Bones are diffusely demineralized. Compression
fractures are noted at T11, T12, L1 and T6.

Review of the MIP images confirms the above findings.
IMPRESSION: 1. No large central pulmonary embolus or evidence for pulmonary
embolus to either upper lobe. Assessment of segmental and
subsegmental pulmonary arteries to the lower lobes is unreliable due
to respiratory breathing motion throughout image acquisition.
2. Fairly marked emphysema with patchy bilateral airspace disease,
right greater than left. Airspace opacity is confluent in some
regions of the posterior right lower lobe. Multifocal pneumonia is a
primary consideration. Distribution is not central as typically seen
in the setting of pulmonary hemorrhage. Close follow-up recommended
to ensure resolution.
3. Mediastinal and right hilar lymphadenopathy, potentially
reactive. Attention on follow-up recommended to ensure resolution.
4.  Aortic Atherosclerois (27ECD-170.0)
5. Osteopenia with multiple thoracolumbar compression fractures.

## 2021-05-31 IMAGING — DX DG CHEST 1V PORT
1 series · 1 of 1 positions shown · non-contrast
Comparison: Chest radiograph dated 05/04/2019

CLINICAL DATA: Hypoxia and shortness of breath

EXAM:
PORTABLE CHEST 1 VIEW

[chest ap]
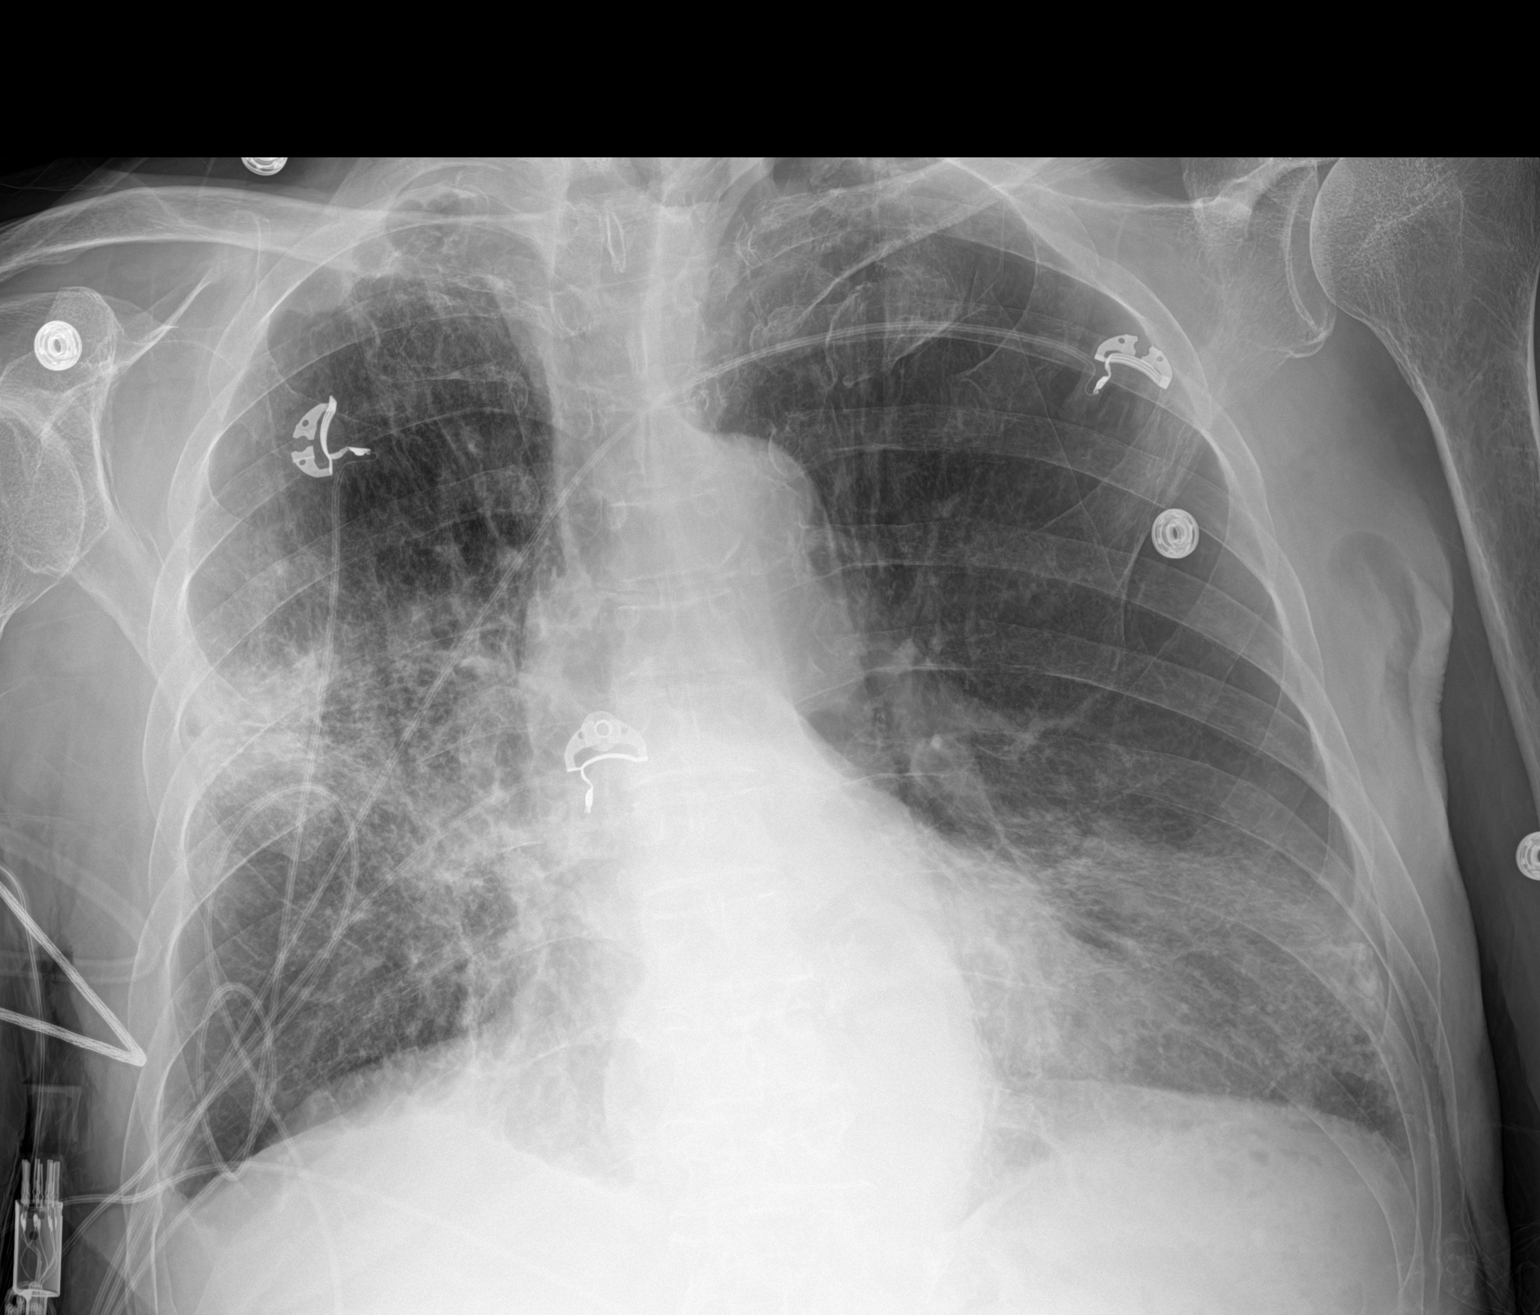

[1 of 1 positions shown; findings below may reference images not displayed]

FINDINGS: The heart size and mediastinal contours are within normal limits.
Airspace opacities in the right mid lung have increased since prior
exam. Right mid and left lower lobe reticular opacities are not
significantly changed. There is no pleural effusion or pneumothorax.
A hiatal hernia is unchanged.
IMPRESSION: Worsening airspace opacities in the right mid lung, which may
represent pneumonia. Unchanged right mid and left lower lobe
reticular opacities.
